# Patient Record
Sex: Female | Born: 1940 | Race: White | Hispanic: No | Smoking: Former smoker
Health system: Southern US, Community
[De-identification: ages and names within clinical notes are randomized; demographics above are authoritative.]

## PROBLEM LIST (undated history)

## (undated) DIAGNOSIS — E039 Hypothyroidism, unspecified: Secondary | ICD-10-CM

## (undated) DIAGNOSIS — J45909 Unspecified asthma, uncomplicated: Secondary | ICD-10-CM

## (undated) DIAGNOSIS — C52 Malignant neoplasm of vagina: Secondary | ICD-10-CM

## (undated) DIAGNOSIS — I10 Essential (primary) hypertension: Secondary | ICD-10-CM

## (undated) DIAGNOSIS — T8859XA Other complications of anesthesia, initial encounter: Secondary | ICD-10-CM

## (undated) DIAGNOSIS — R112 Nausea with vomiting, unspecified: Secondary | ICD-10-CM

## (undated) DIAGNOSIS — C539 Malignant neoplasm of cervix uteri, unspecified: Secondary | ICD-10-CM

## (undated) DIAGNOSIS — E785 Hyperlipidemia, unspecified: Secondary | ICD-10-CM

## (undated) DIAGNOSIS — Z9889 Other specified postprocedural states: Secondary | ICD-10-CM

## (undated) DIAGNOSIS — N904 Leukoplakia of vulva: Secondary | ICD-10-CM

## (undated) DIAGNOSIS — C519 Malignant neoplasm of vulva, unspecified: Secondary | ICD-10-CM

## (undated) DIAGNOSIS — Z8709 Personal history of other diseases of the respiratory system: Secondary | ICD-10-CM

## (undated) DIAGNOSIS — T4145XA Adverse effect of unspecified anesthetic, initial encounter: Secondary | ICD-10-CM

## (undated) DIAGNOSIS — J189 Pneumonia, unspecified organism: Secondary | ICD-10-CM

## (undated) DIAGNOSIS — I728 Aneurysm of other specified arteries: Secondary | ICD-10-CM

## (undated) DIAGNOSIS — Z87411 Personal history of vaginal dysplasia: Secondary | ICD-10-CM

## (undated) HISTORY — PX: SUPRAPUBIC CATHETER PLACEMENT: SHX2473

## (undated) HISTORY — PX: ABDOMINAL HYSTERECTOMY: SHX81

## (undated) HISTORY — DX: Malignant neoplasm of cervix uteri, unspecified: C53.9

## (undated) HISTORY — PX: BREAST SURGERY: SHX581

## (undated) HISTORY — DX: Essential (primary) hypertension: I10

## (undated) HISTORY — DX: Hyperlipidemia, unspecified: E78.5

## (undated) HISTORY — DX: Hypothyroidism, unspecified: E03.9

---
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
## 2005-03-08 ENCOUNTER — Emergency Department (HOSPITAL_COMMUNITY): Admission: EM | Admit: 2005-03-08 | Discharge: 2005-03-08 | Payer: Self-pay | Admitting: Emergency Medicine

## 2005-03-08 IMAGING — CR DG CHEST 2V
2 series · 2 of 2 positions shown · non-contrast
Comparison: none

CLINICAL DATA: Dyspnea. 
 CHEST - 2 VIEW:

[view not recorded (1 of 2)]
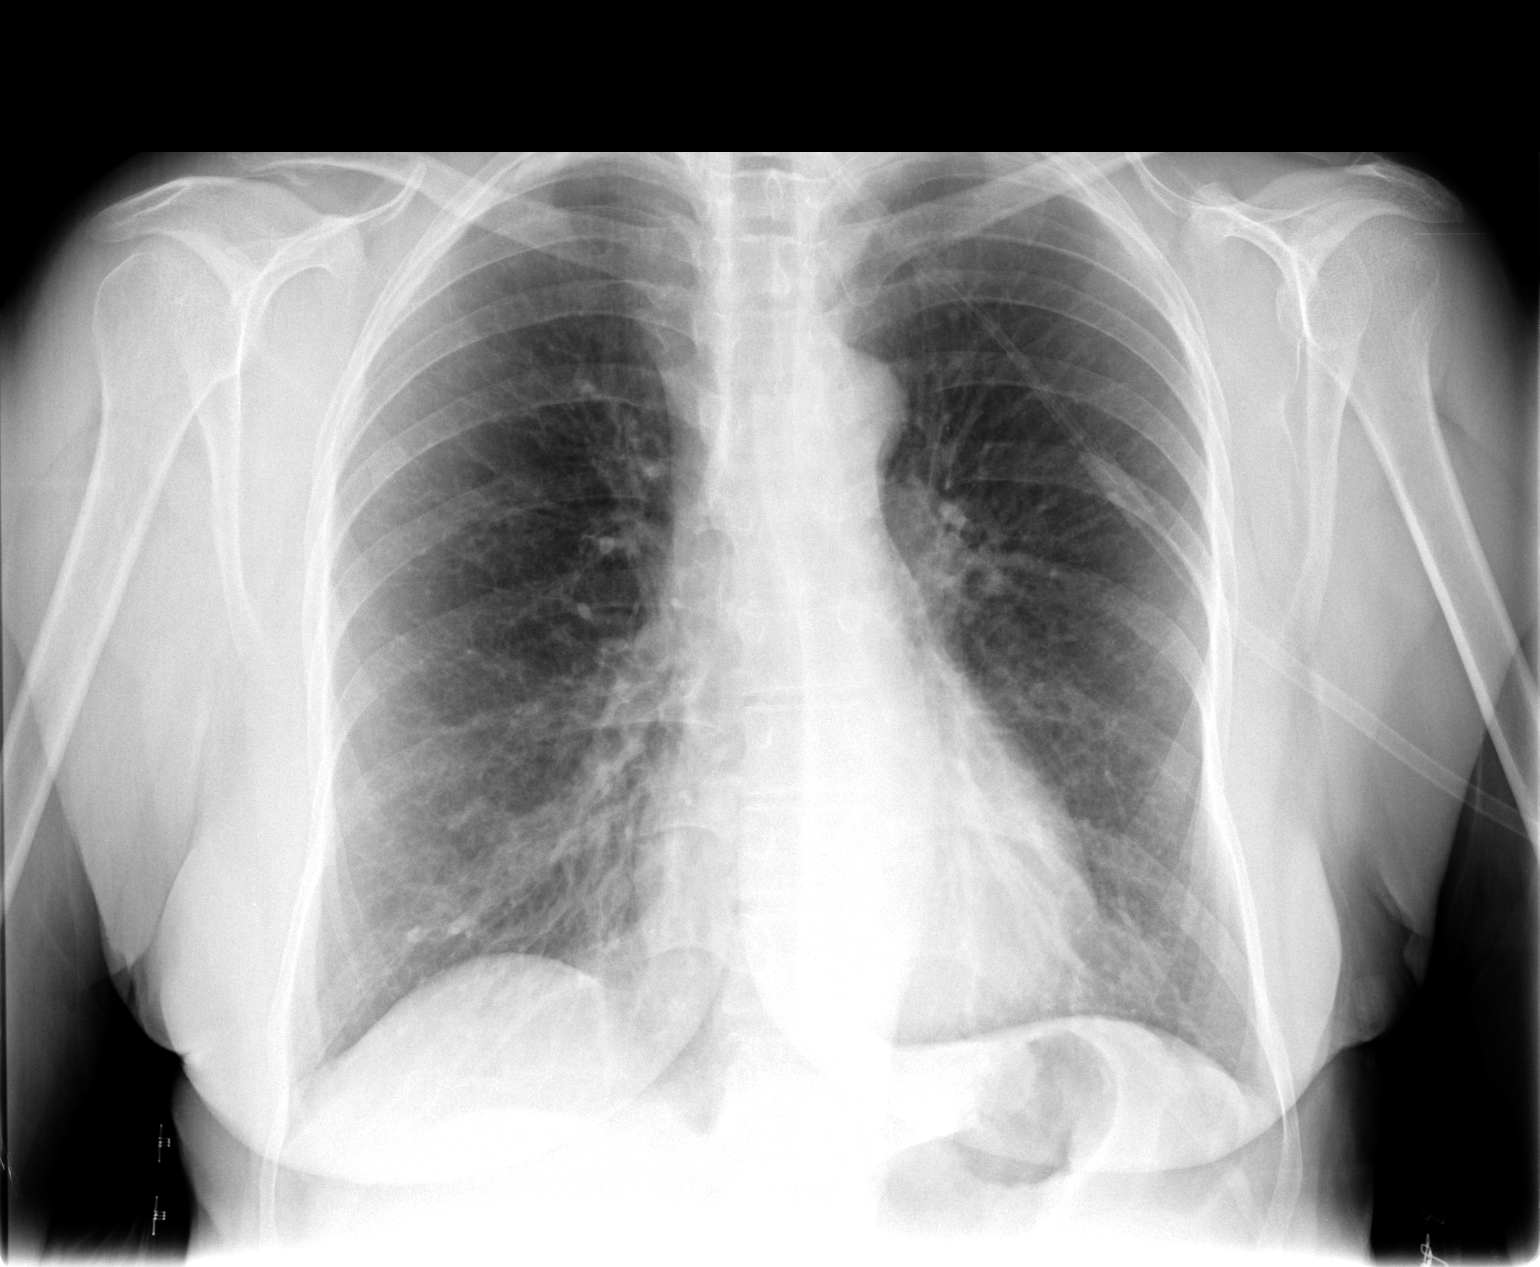

[view not recorded (2 of 2)]
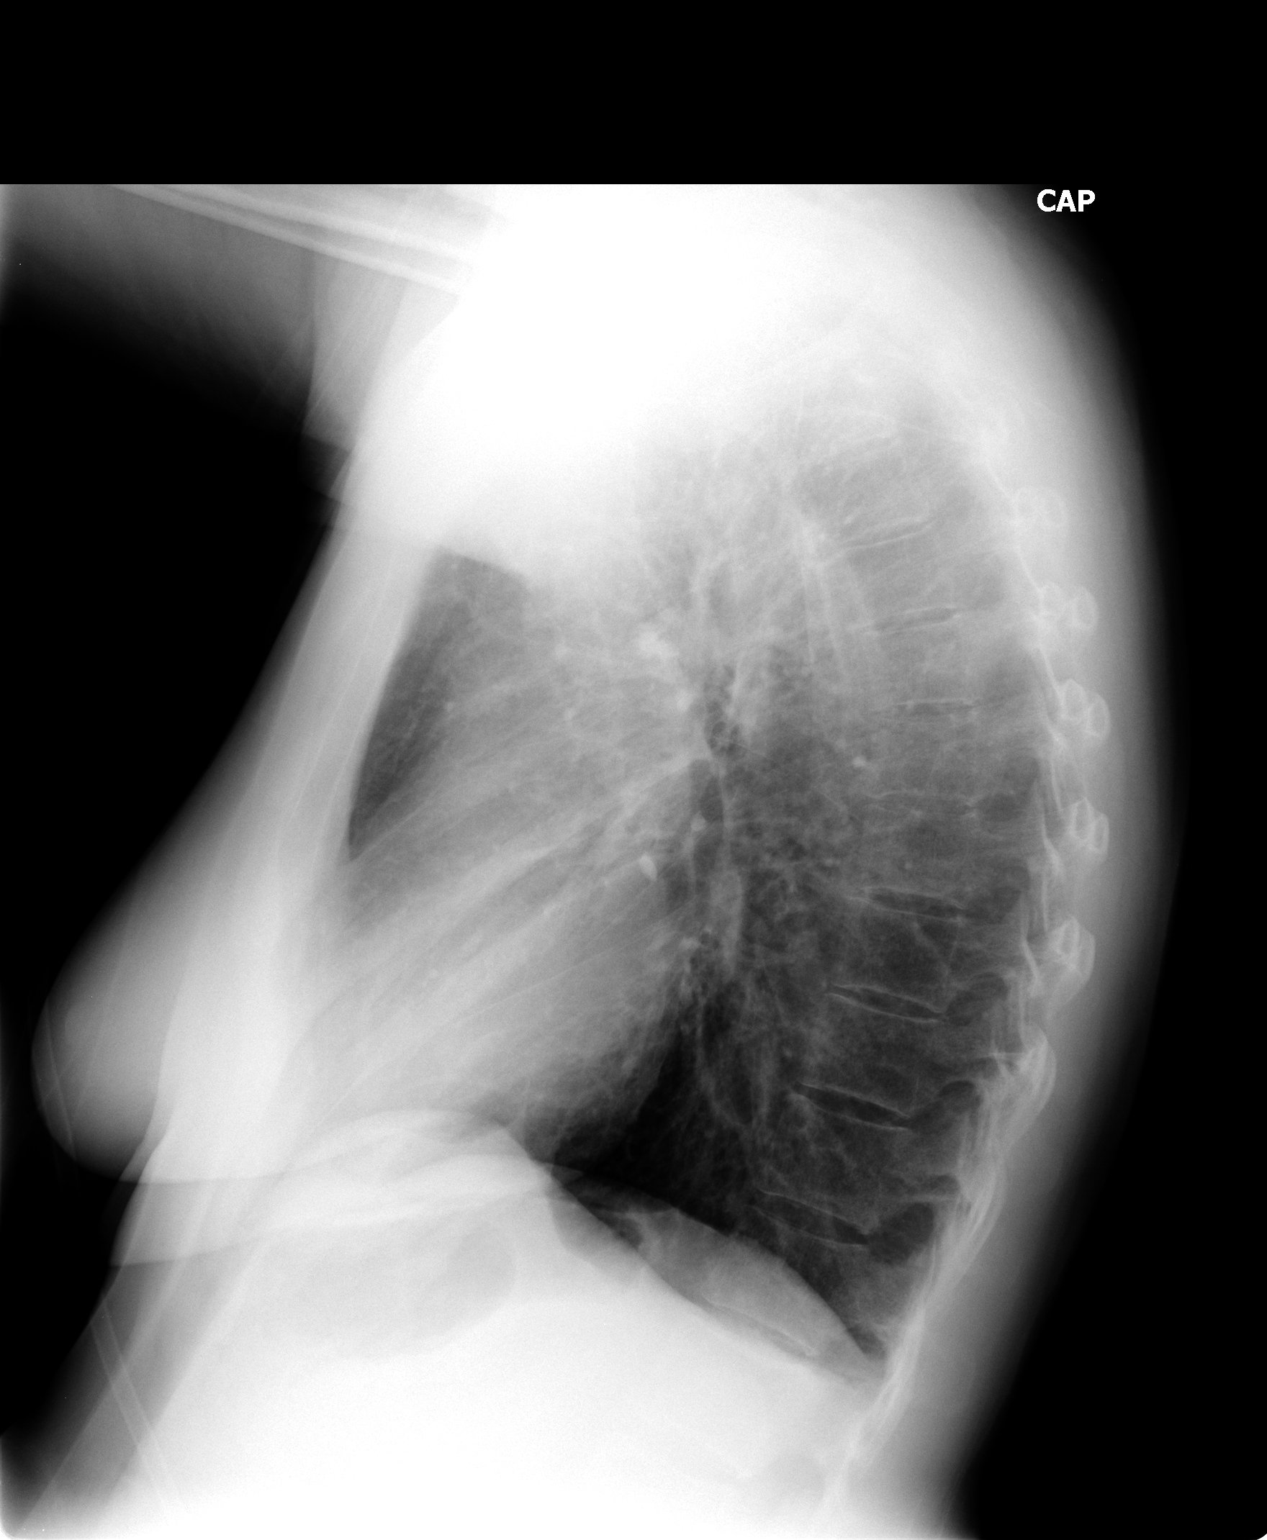

[2 of 2 positions shown; findings below may reference images not displayed]

FINDINGS: Cardiomediastinal silhouette is unremarkable.  Mild peribronchial thickening is present without focal air space disease.  There is suggestion of COPD present.  No pleural effusions or pneumothorax identified.  The bony thorax is within normal limits except for right AC joint osteoarthritis.
IMPRESSION: Peribronchial thickening with suggestion of COPD.  No focal air space disease.

## 2005-03-19 ENCOUNTER — Encounter (INDEPENDENT_AMBULATORY_CARE_PROVIDER_SITE_OTHER): Payer: Self-pay | Admitting: Cardiology

## 2005-03-19 ENCOUNTER — Ambulatory Visit (HOSPITAL_COMMUNITY): Admission: RE | Admit: 2005-03-19 | Discharge: 2005-03-19 | Payer: Self-pay | Admitting: Family Medicine

## 2006-02-09 ENCOUNTER — Other Ambulatory Visit: Admission: RE | Admit: 2006-02-09 | Discharge: 2006-02-09 | Payer: Self-pay | Admitting: Family Medicine

## 2006-03-09 DIAGNOSIS — Z8541 Personal history of malignant neoplasm of cervix uteri: Secondary | ICD-10-CM

## 2006-03-09 HISTORY — PX: ABDOMINAL HYSTERECTOMY: SHX81

## 2006-03-09 HISTORY — DX: Personal history of malignant neoplasm of cervix uteri: Z85.41

## 2006-03-30 ENCOUNTER — Ambulatory Visit: Admission: RE | Admit: 2006-03-30 | Discharge: 2006-03-30 | Payer: Self-pay | Admitting: Gynecologic Oncology

## 2006-04-09 DIAGNOSIS — C539 Malignant neoplasm of cervix uteri, unspecified: Secondary | ICD-10-CM

## 2006-04-09 HISTORY — DX: Malignant neoplasm of cervix uteri, unspecified: C53.9

## 2006-04-27 ENCOUNTER — Inpatient Hospital Stay (HOSPITAL_COMMUNITY): Admission: RE | Admit: 2006-04-27 | Discharge: 2006-04-30 | Payer: Self-pay | Admitting: Obstetrics & Gynecology

## 2006-04-27 ENCOUNTER — Encounter (INDEPENDENT_AMBULATORY_CARE_PROVIDER_SITE_OTHER): Payer: Self-pay | Admitting: Specialist

## 2006-04-27 HISTORY — PX: TOTAL ABDOMINAL HYSTERECTOMY W/ BILATERAL SALPINGOOPHORECTOMY: SHX83

## 2006-06-22 ENCOUNTER — Ambulatory Visit: Admission: RE | Admit: 2006-06-22 | Discharge: 2006-06-22 | Payer: Self-pay | Admitting: Gynecology

## 2006-12-08 ENCOUNTER — Other Ambulatory Visit: Admission: RE | Admit: 2006-12-08 | Discharge: 2006-12-08 | Payer: Self-pay | Admitting: Gynecology

## 2006-12-17 ENCOUNTER — Encounter: Payer: Self-pay | Admitting: Gynecology

## 2006-12-17 ENCOUNTER — Ambulatory Visit: Admission: RE | Admit: 2006-12-17 | Discharge: 2006-12-17 | Payer: Self-pay | Admitting: Gynecology

## 2007-07-29 ENCOUNTER — Encounter: Payer: Self-pay | Admitting: Gynecology

## 2007-07-29 ENCOUNTER — Ambulatory Visit: Admission: RE | Admit: 2007-07-29 | Discharge: 2007-07-29 | Payer: Self-pay | Admitting: Gynecology

## 2007-07-29 ENCOUNTER — Other Ambulatory Visit: Admission: RE | Admit: 2007-07-29 | Discharge: 2007-07-29 | Payer: Self-pay | Admitting: Gynecology

## 2008-03-06 ENCOUNTER — Other Ambulatory Visit: Admission: RE | Admit: 2008-03-06 | Discharge: 2008-03-06 | Payer: Self-pay | Admitting: Gynecology

## 2008-03-06 ENCOUNTER — Ambulatory Visit: Admission: RE | Admit: 2008-03-06 | Discharge: 2008-03-06 | Payer: Self-pay | Admitting: Gynecology

## 2008-03-06 ENCOUNTER — Encounter: Payer: Self-pay | Admitting: Gynecology

## 2009-02-15 ENCOUNTER — Other Ambulatory Visit: Admission: RE | Admit: 2009-02-15 | Discharge: 2009-02-15 | Payer: Self-pay | Admitting: Gynecology

## 2009-02-15 ENCOUNTER — Ambulatory Visit: Admission: RE | Admit: 2009-02-15 | Discharge: 2009-02-15 | Payer: Self-pay | Admitting: Gynecology

## 2010-03-05 ENCOUNTER — Other Ambulatory Visit
Admission: RE | Admit: 2010-03-05 | Discharge: 2010-03-05 | Payer: Self-pay | Source: Home / Self Care | Admitting: Gynecology

## 2010-03-05 ENCOUNTER — Ambulatory Visit
Admission: RE | Admit: 2010-03-05 | Discharge: 2010-03-05 | Payer: Self-pay | Source: Home / Self Care | Attending: Gynecology | Admitting: Gynecology

## 2010-03-09 DIAGNOSIS — I728 Aneurysm of other specified arteries: Secondary | ICD-10-CM

## 2010-03-09 HISTORY — DX: Aneurysm of other specified arteries: I72.8

## 2010-07-22 NOTE — Consult Note (Signed)
[name], [name]                   ACCOUNT NO.:  [id number]   MEDICAL RECORD NO.:  [id number]          PATIENT TYPE:  OUT   LOCATION:  GYN                          FACILITY:  [facility]   PHYSICIAN:  De Blanch, M.D.DATE OF BIRTH:  12-22-1940   DATE OF CONSULTATION:  12/17/2006  DATE OF DISCHARGE:                                 CONSULTATION   CHIEF COMPLAINT:  Cervical cancer.   INTERVAL HISTORY:  The patient returns today for continuing followup of  stage I cervical cancer. In the interval since her last visit, she has  seen Dr. Fransico [name] in [place].  The patient's only complaint is that  of anal irritation.  She denies any pelvic pain, pressure, vaginal  bleeding, or discharge.  She does have some urinary hesitancy, but she  has learned how to manage voiding with this change after surgery.  Otherwise, her health has been good.   PAST MEDICAL HISTORY:  1. Hypertension.  2. Hyperlipidemia.   PAST SURGICAL HISTORY:  Radical hysterectomy with pelvic lymphadenectomy  February 2008.   CURRENT MEDICATIONS:  Crestor and hydrochlorothiazide.   DRUG ALLERGIES:  CODEINE.   SOCIAL HISTORY:  The patient was a one-half-pack-a-day smoker for 20  years.  She no longer smokes.  She is a widow.  She works in Teaching laboratory technician.   FAMILY HISTORY:  Is negative for gynecologic, breast, or colon cancer.   REVIEW OF SYSTEMS:  A 10-point comprehensive Review of Systems negative  except as noted above.   PHYSICAL EXAMINATION:  VITAL SIGNS:  Weight 161 pounds, blood pressure  110/70, pulse 80, respiratory rate 20.  GENERAL:  The patient is a healthy white female in no acute distress.  HEENT:  Negative.  NECK:  Supple without thyromegaly.  ADENOPATHY:  There is no supraclavicular or inguinal adenopathy.  ABDOMEN:  Soft, nontender.  No mass, organomegaly, ascites or hernias  noted.  PELVIC:  EG/BUS shows a fair amount of dermatitis around the anus.  The  vulva itself is fine. Vagina is normal.   No lesions noted.  Bimanual and  rectovaginal exams reveal no mass, induration, or nodularity.   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   PLAN:  Pap smears were obtained.   The patient has anal irritation and given a prescription for Temovate  0.05% to be applied b.i.d. until the dermatitis resolves.  She will  return to see Dr. Shon [name] in 3 months, return to see [place] in 7 months.   INDICATIONS:  Copy [name] and then she [name] thank you      De Blanch, M.D.  Electronically Signed     DC/MEDQ  D:  12/17/2006  T:  12/17/2006  Job:  [id number]   cc:   [name]  Fax: [phone]   [name], R.N.  [address]  [place], [place] [address]

## 2010-07-22 NOTE — Consult Note (Signed)
[name], [name]                   ACCOUNT NO.:  [id number]   MEDICAL RECORD NO.:  [id number]          PATIENT TYPE:  OUT   LOCATION:  GYN                          FACILITY:  [facility]   PHYSICIAN:  De Blanch, M.D.DATE OF BIRTH:  10-01-1940   DATE OF CONSULTATION:  07/29/2007  DATE OF DISCHARGE:                                 CONSULTATION   CHIEF COMPLAINT:  Cervical cancer.   INTERVAL HISTORY:  The patient returns today for continuing follow-up.  She reports that she has done well since her last visit.  She denies any  GI symptoms.  She has no pelvic pain, pressure, vaginal bleeding or  discharge.  She does continue to have some urinary hesitancy.  She  denies any other urinary tract symptoms and has not had any infections  over the past 6 months.   HISTORY OF PRESENT ILLNESS:  The patient underwent a radical  hysterectomy for a stage Ib1 squamous cell carcinoma of the cervix on  April 27, 2006.  All margins and lymph nodes were free of disease.  The depth of invasion was 6 mm and no lymphovascular space involvement  was noted.  No adjuvant therapy was offered.   PAST MEDICAL HISTORY:  Medical illnesses:  Hypertension, hyperlipidemia.   PAST SURGICAL HISTORY:  Radical hysterectomy, pelvic lymphadenectomy  February 2008.   CURRENT MEDICATIONS:  Crestor and hydrochlorothiazide.   DRUG ALLERGIES:  CODEINE.   SOCIAL HISTORY:  The patient was a one-half-pack-a-day smoker for 20  years.  She no longer smokes.  She is widowed.  She works in Teaching laboratory technician.   FAMILY HISTORY:  Negative for gynecologic, breast or colon cancer.   REVIEW OF SYSTEMS:  Ten-point comprehensive review of systems negative  except as noted above.  The patient is quite concerned about her weight  gain.   PHYSICAL EXAMINATION:  Weight 167 pounds (up 6 pounds since October  2008).  Blood pressure 110/70, pulse 80, respiratory rate 20.  GENERAL:  The patient is a healthy white female in no acute  distress.  HEENT:  Negative.  NECK:  Supple without thyromegaly.  There is no supraclavicular or  inguinal adenopathy.  ABDOMEN:  Soft, nontender.  No masses, organomegaly or ascites are  noted.  She does have an umbilical hernia which is easily reducible.  PELVIC:  EG/BUS, vagina, bladder, urethra are normal.  Cervix and uterus  are surgically absent.  Adnexa without masses.  Rectovaginal exam  confirms.   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  No evidence of recurrent disease.  Pap smears are obtained.  The  patient will return to see Dr. Fransico Michael in 4 months and return to see  [place] in 8 months.      De Blanch, M.D.  Electronically Signed     DC/MEDQ  D:  07/29/2007  T:  07/29/2007  Job:  [id number]   cc:   Fransico Michael  Fax: [phone]   Telford Nab, R.N.  [phone] [address]  [place], [place] [address]

## 2010-07-22 NOTE — Consult Note (Signed)
NAMETERRICKA, [name]                   ACCOUNT NO.:  [id number]   MEDICAL RECORD NO.:  [id number]          PATIENT TYPE:  OUT   LOCATION:  GYN                          FACILITY:  [facility]   PHYSICIAN:  De Blanch, M.D.DATE OF BIRTH:  1941/01/31   DATE OF CONSULTATION:  03/06/2008  DATE OF DISCHARGE:                                 CONSULTATION   CHIEF COMPLAINT:  Cervical cancer.   INTERVAL HISTORY:  Patient returns today for continuing followup of  cervical cancer.  Since her last visit, she has done well.  She denies  any GI or GU symptoms, has no pelvic pain, pressure, vaginal bleed, or  discharge.   Patient recently had negative mammograms and has also recently been  diagnosed as having hypothyroidism.   HISTORY OF PRESENT ILLNESS:  Patient underwent a radical hysterectomy  for a stage IB1 squamous cell carcinoma of the cervix, February 2008.  All margins and lymph nodes were free of disease.  No adjuvant therapy  was recommended.   PAST MEDICAL HISTORY/MEDICAL ILLNESSES:  1. Hypertension.  2. Hyperlipidemia.  3. Hypothyroidism.   PAST SURGICAL HISTORY:  1. Radical hysterectomy.  2. Pelvic lymphadenectomy.  3. Suprapubic catheter placement, February 2008.   CURRENT MEDICATIONS:  1. Crestor.  2. Hydrochlorothiazide.  3. Synthroid.   DRUG ALLERGIES:  CODEINE.   SOCIAL HISTORY:  The patient is a 1/2 pack a day smoker for 20 years.  She did discontinue smoking several years ago.  She is widowed.  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   FAMILY HISTORY:  Negative for gynecologic, breast, or colon cancer.   REVIEW OF SYSTEMS:  A 10-point comprehensive review of systems negative  except as noted above.   PHYSICAL EXAM:  Weight 158 pounds.  Blood pressure 123/60.  Pulse 80.  Respiratory 20.  GENERAL:  The patient is a healthy white female in no acute distress.  HEENT:  Negative.  NECK:  Supple without thyromegaly.  There is no supraclavicular or  inguinal  adenopathy.  ABDOMEN:  Soft and nontender.  No mass, organomegaly, ascites, or  hernias are noted.  PELVIC EXAM:  EG/BUS, vagina, bladder, urethra are normal.  Cervix and  uterus are surgically absent.  The vagina is somewhat foreshortened.  Bimanual and rectovaginal exam reveal no masses, induration, or  nodularity.   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  No  evidence of recurrent disease.   PLAN:  Pap smears were obtained.  Patient will return to see Dr. Fransico Michael in 6 months and return to see [place] in 1 year.      De Blanch, M.D.  Electronically Signed     DC/MEDQ  D:  03/06/2008  T:  03/07/2008  Job:  [id number]   cc:   Fransico Michael  Fax: [phone]   Telford Nab, R.N.  [phone] [address]  [place], [place] [address]

## 2010-07-25 NOTE — Op Note (Signed)
[name], [name]                   ACCOUNT NO.:  [id number]   MEDICAL RECORD NO.:  [id number]          PATIENT TYPE:  INP   LOCATION:  1617                         FACILITY:  [facility]   PHYSICIAN:  De Blanch, M.D.DATE OF BIRTH:  1940-10-05   DATE OF PROCEDURE:  04/27/2006  DATE OF DISCHARGE:                               OPERATIVE REPORT   PREOPERATIVE DIAGNOSIS:  Stage I-B1 squamous cell carcinoma of the  cervix.   POSTOPERATIVE DIAGNOSIS:  Stage I-B1 squamous cell carcinoma of the  cervix.   PROCEDURE:  DICTATION ENDED HERE.      De Blanch, M.D.  Electronically Signed     DC/MEDQ  D:  04/27/2006  T:  04/28/2006  Job:  [id number]

## 2010-07-25 NOTE — Discharge Summary (Signed)
[name], [name]                   ACCOUNT NO.:  [id number]   MEDICAL RECORD NO.:  [id number]          PATIENT TYPE:  INP   LOCATION:  1617                         FACILITY:  [facility]   PHYSICIAN:  Roseanna Rainbow, M.D.DATE OF BIRTH:  [birth year]   DATE OF ADMISSION:  04/27/2006  DATE OF DISCHARGE:  04/30/2006                               DISCHARGE SUMMARY   CHIEF COMPLAINT:  The patient is a 70 year old with stage I, B1,  squamous cell carcinoma of the cervix.  She presents for a radical  hysterectomy.  Please see the dictated history and physical for further  details.   HOSPITAL COURSE:  The patient was admitted and underwent a radical  hysterectomy with bilateral salpingo-oophorectomy, pelvic  lymphadenectomy, and suprapubic catheter placement.  Again, please see  the dictated operative summary.  On postoperative day #1, her hemoglobin  was 9.5.  Basic metabolic profile was normal.  She was encouraged to  ambulate.  The remainder of her hospital course was uneventful.  She was  discharged to home on postoperative day #3, tolerating a regular diet.   DISCHARGE DIAGNOSIS:  Stage I, B1, squamous cell carcinoma of the  cervix.   PROCEDURES:  Radical hysterectomy, bilateral salpingo-oophorectomy,  pelvic lymphadenectomies and a suprapubic catheter placement.   CONDITION:  Stable   ACTIVITY:  Progressive activity, pelvic rest.   MEDICATIONS:  Included Percocet 1 to 2 tablets every 6 hours as needed.   DISPOSITION:  The patient was to follow up in the GYN oncology office in  1 week.      Roseanna Rainbow, M.D.  Electronically Signed     LAJ/MEDQ  D:  06/09/2006  T:  06/09/2006  Job:  [id number]   cc:   Telford Nab, R.N.  [address]  [place], [place] [address]

## 2010-07-25 NOTE — Consult Note (Signed)
[name], [name]                   ACCOUNT NO.:  [id number]   MEDICAL RECORD NO.:  [id number]          PATIENT TYPE:  OUT   LOCATION:  GYN                          FACILITY:  [facility]   PHYSICIAN:  De Blanch, M.D.DATE OF BIRTH:  1940-08-13   DATE OF CONSULTATION:  06/22/2006  DATE OF DISCHARGE:                                 CONSULTATION   CHIEF COMPLAINT:  Cervical cancer.   INTERVAL HISTORY:  The patient underwent a radical hysterectomy and  pelvic lymphadenectomy, April 27, 2006.  Final pathology showed a  squamous cell carcinoma with 6 mm of invasion out of a 1.5-cm-thick  cervix.  Pelvic lymph nodes and surgical margins were negative.  The  patient had an uncomplicated postoperative course and has subsequently  had her suprapubic catheter removed.  Overall, her functional status is  very good and she comes today for her first postoperative checkup.   She denies any GI or GU symptoms and feels at her bladder is emptying  reasonably well and has no incontinence, although she does have some  urgency.   PHYSICAL EXAM:  VITAL SIGNS:  Weight 162 pounds, blood pressure 130/70,  pulse 80, respiratory rate 20.  GENERAL:  The patient is a healthy white female in no acute distress.  HEENT:  Negative.  NECK:  Supple without thyromegaly.  LYMPHATICS:  There is no supraclavicular or inguinal adenopathy.  ABDOMEN:  Her incision is healing well.  The abdomen is otherwise soft  and nontender.  No mass, organomegaly, ascites or hernias are noted.  PELVIC:  EG/BUS, vagina, bladder and urethra are normal.  Cervix and  uterus are surgically absent.  Cuff is healing well.  Bimanual and  rectovaginal exam reveal no mass, induration or nodularity.  LOWER EXTREMITIES:  Without edema or varicosities.   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  She has good prognostic features and I do  not believe any adjuvant therapy is  necessary.  We will have her return  to see Dr. Fransico Michael in 3 months and ask that a physical exam and Pap  smear be obtained at that time.  She will return to see [place] in 6 months.      De Blanch, M.D.  Electronically Signed     DC/MEDQ  D:  06/22/2006  T:  06/23/2006  Job:  [id number]   cc:   Telford Nab, R.N.  [address]  [place], [place] [address]   Fransico Michael  Fax: [phone]

## 2010-07-25 NOTE — Op Note (Signed)
[name], [name]                   ACCOUNT NO.:  [id number]   MEDICAL RECORD NO.:  [id number]          PATIENT TYPE:  INP   LOCATION:  0001                         FACILITY:  [facility]   PHYSICIAN:  De Blanch, M.D.DATE OF BIRTH:  26-Jun-1940   DATE OF PROCEDURE:  04/27/2006  DATE OF DISCHARGE:                               OPERATIVE REPORT   PREOPERATIVE DIAGNOSIS:  Stage Ib1 squamous cell carcinoma of the  cervix.   POSTOPERATIVE DIAGNOSIS:  Stage Ib1 squamous cell carcinoma of the  cervix.   PROCEDURE:  1. Radical abdominal hysterectomy.  2. Bilateral salpingo-oophorectomy.  3. Pelvic lymphadenectomy.  4. Placement of suprapubic catheter.   SURGEON:  De Blanch, M.D.   ASSISTANT:  Roseanna Rainbow, M.D.  and Telford Nab, R.N.   ANESTHESIA:  General endotracheal tube.   ESTIMATED BLOOD LOSS:  450 mL.   SURGICAL FINDINGS:  The patient had an approximately 1 cm, slightly  exophytic lesion on the exocervix in the anterior portion.  She was  status post tubal ligation with disruption of her fallopian tubes.  Otherwise, the uterus, tubes, and ovaries appeared normal.  There was no  evidence of adenopathy.  Palpation of the aorta revealed considerable  atherosclerosis, but not evidence of adenopathy.  The remainder of the  peritoneal cavity appeared normal.   DESCRIPTION OF PROCEDURE:  The patient was taken to the operating room;  and after satisfactory attainment of general anesthesia, was placed in  the modified lithotomy position in Larke stirrups.  The anterior  abdominal wall, perineum, and vagina were prepped with Betadine.  A  Foley catheter was inserted and the patient was draped.  The abdomen was  entered through a Pfannenstiel incision.  The abdomen and pelvis  explored with the above-noted findings.  A Bookwalter retractor was  assembled; and the bowel was packed out of the pelvis.  The uterus was  grasped with long Kelly clamps.  The  right round ligament was divided  with cautery; and the retroperitoneal space opened after incising the  lateral side wall.  The pararectal and perivesical spaces were  developed, identifying the vessels and ureter.  The ovarian vessels were  skeletonized, clamped, retied, and suture ligated.  The superior vesical  artery was isolated and traced cephalad until we identified the uterine  artery.  The uterine artery was doubly clipped at its origin, divided  and the distal portion marked with a long silk free-tie.  The parametria  were then divided using the linear stapler.   A similar procedure was performed on both sides of the pelvic.  The  ureters were mobilized from their attachments to the peritoneum and the  posterior cul-de-sac was incised.  The rectovaginal septum was  developed.  Some venous bleeding from the vaginal was encountered.  A  pack was placed once.  The pack was removed; and the uterosacral  ligament was developed and these were divided using a linear stapler.  The bladder flap was advanced to sharp and blunt dissection off the  cervix and upper vagina.  The ureter was then mobilized out  of its  distal tunnel, divided in the anterior vesicouterine ligament between  [facility] clamps.  Each pedicle was suture ligated.  Ureter was then  mobilized all the way to its insertion to the bladder bilaterally.  With  the ureter was held laterally, the posterior vesicouterine ligament was  clamped, divided, and suture ligated.  Paravaginal tissue was then  clamped, cut, and suture ligated.  Vaginal angles were then cross  clamped and divided; and the vagina incised circumferentially.  The  specimen was removed.  It was noted that the vaginal margin did not  appear quite satisfactory; and, therefore, an additional margin of  approximately 1.5  cm was obtained by further mobilizing the bladder and  rectum off the anterior and posterior vagina.  Once this vaginal margin  was removed;  it was marked with a suture of the distal portion at 12  o'clock; this was submitted as a separate specimen.  The vagina was then  closed with interrupted figure-of-eight sutures of #0 Vicryl.  The  central portion vagina was incorporated with the bladder peritoneum; and  the rectal peritoneum thus reperitonealizng the central portion of the  pelvis.   Attention was turned to the pelvic lymphadenectomy.  Lymph nodes were  removed from the external iliac artery and vein, internal iliac artery,  and obturator fossa.  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  This had previously been clipped near its origin.  The vein was, again, identified and clipped at its origin from the  external iliac vein as we proceeded with the complete lymphadenectomy.  The obturator nerve was identified and protected throughout the  dissection.  A similar procedure was performed on both sides of the  pelvis.   The pelvis was then irrigated and found to be hemostatic.  The  retropubic space was opened and a cystotomy was informed in the dome of  the bladder.  The suprapubic catheter was introduced and the cystotomy  closed with two layers of 2-0 Vicryl.  Prior to closing the cystotomy,  the patient is given intravenous indigo carmine; and it was noted that  she had good efflux from both ureteral orifices.   It should be noted that prior to closing the bladder, and in the course  of mobilizing the ureter out of the tunnel; a 1 cm cystotomy had been  created on the lateral portion of the left side of the bladder.  This  had been closed in two layers using 2-0 Vicryl.  It had also been tested  by insufflating the bladder with saline; and no leak was noted.  By  using IV indigo carmine, we assured ourselves that we had not ligated  the distal ureter either.  Pelvis was, once again, reirrigated.  Packs and  retractors were removed;  and the anterior abdominal wall closed in layers; the first being a  running #0 Vicryl suture on the peritoneum.  The fascia was  reapproximated with a running suture of #1 PDS.  Subcutaneous tissue was  irrigated.  Hemostasis achieved with cautery.  Skin closed with skin  staples.  A dressing was applied.  The suprapubic catheter was sutured  to the skin using 2-0 silk.  The patient was awakened from anesthesia;  and taken to recovery room in satisfactory condition.  Sponge, needle  and instrument counts were correct x2.      De Blanch, M.D.  Electronically Signed     DC/MEDQ  D:  04/27/2006  T:  04/27/2006  Job:  [id number]   cc:   Fransico Michael  Fax: [phone]   Telford Nab, R.N.  [phone] [address]  [place], [place] [address]

## 2010-07-25 NOTE — Consult Note (Signed)
[name], [name]                   ACCOUNT NO.:  [id number]   MEDICAL RECORD NO.:  [id number]          PATIENT TYPE:  OUT   LOCATION:  GYN                          FACILITY:  [facility]   PHYSICIAN:  [name], MD    DATE OF BIRTH:  [birth year]   DATE OF CONSULTATION:  03/30/2006  DATE OF DISCHARGE:                                 CONSULTATION   The patient is seen today in consultation at the request of Dr. Shon Baton.  [name] is a 70 year old gravida 3, para 3 who has not had a routine  GYN examination in about five years.  She was seen by her primary  physician, Dr. Harlene Salts office, at which point it was recommended that  she undergo a Pap smear.  Her Pap smear revealed squamous cell carcinoma  and she subsequently seen by Dr. Shon Baton.  Dr. Shon Baton saw the patient on  March 15, 2006.  Biopsies were obtained at that time.  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  There was an invasive  component compatible with at least microinvasive disease.  There is a  focus of suspicious for lymphovascular space involvement.  Endocervical  curettage revealed high grade dysplasia.  This has been her first  abnormal Pap smear ever, but again, she has not had GYN care in greater  than five years.  She denies any bleeding, any significant discharge or  pain.  She denies any change in bowel or bladder habits.  Her  cholesterol has been quite high at almost 400.  She was placed on  Crestor and is watching her diet steadily and her cholesterol dropped  from 391 to 214. With that associated dietary modification, she has lost  approximately 8 pounds, however, that has been intentional.  She denies  any chest pain, short of breath, nausea, vomiting, fevers, chills,  headaches or visual changes.   MEDICATIONS:  Crestor and hydrochlorothiazide.   ALLERGIES:  CODEINE which cause her to be jittery.   SOCIAL  HISTORY:  She quit smoking a year ago, she smoked half a pack a  day for about 20 years.  She denies use of alcohol.  She is a widow.  She works in Teaching laboratory technician.   PAST MEDICAL HISTORY:  Significant for asthma, hypercholesterolemia, and  hypertension.   PAST SURGICAL HISTORY:  A cyst removed from her right nipple which was  benign, tubal ligation, spontaneous vaginal deliveries x3.   FAMILY HISTORY:  Her mother had a myocardial infarction.  She is 69  years old at this time, alive and well.   HEALTH MAINTENANCE:  Her mammogram was a year ago.  She has never had a  colonoscopy though she does do fecal occult stool samples on a yearly  basis and those have been negative.   PHYSICAL EXAMINATION:  VITAL SIGNS:  Weight 158 pounds, height 5 feet 6 inches.  GENERAL:  Well developed, well nourished female in no acute distress.  NECK:  Supple, there is no lymphadenopathy, no thyromegaly.  LUNGS:  Clear to auscultation bilaterally.  CARDIOVASCULAR:  Regular rate and rhythm.  ABDOMEN:  Soft, nontender, nondistended.  No palpable masses or  hepatosplenomegaly.  She has a reducible 1.5 cm umbilical hernia.  Groins are negative for adenopathy.  EXTREMITIES:  No edema.  PELVIC:  External genitalia is within normal limits.  Vagina is somewhat  atrophic.  The cervix is visualized.  On the cervix from the 9 to 12  o'clock position there is an erythematous, raised lesion consistent with  cervical carcinoma.  The area is friable.  It does not extend to the  vagina.  The patient is somewhat poorly tolerant to exam.  Bimanual  examination reveals the cervix is palpably normal.  The erosive lesion  is palpated on the anterior lip of the cervix from 9 to 12 o'clock.  The  corpus of normal size, shape, consistency.  There are no adnexal masses.  Rectovaginal exam reveals a small cervix measuring approximately 3 cm  with this erosive area that measures approximately 1 cm in size.  The  parametria are free on  rectovaginal examination.   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  The biopsy showed invasive and microinvasive disease with  questionable lymphovascular space involvement.  However, the visible  lesion by definition makes the patient a stage 1B.  The options  including surgery versus radiation were discussed with the patient as  well as her two daughters that were with her today.  We discussed  surgery including a radical hysterectomy, BSO and appropriate staging.  We could perform this either via laparotomy or robotic surgery.  The  patient opted for laparotomy.  She wish to have the surgery done in  [place].  The risks of surgery including bleeding, infection, injury  to surrounding organs, thromboembolic disease were discussed with the  patient as more short term sequelae.  Long term sequelae including  difficulty with bowels, bladder, lymphedema, including permanent bowel  or bladder dysfunction, were discussed with the patient and her  questions regarding this were elicited.  She does understand that if she  proceeds with exploratory laparotomy, TAH-BSO, that she may still need  postoperative radiation therapy based on either lymphatic involvement or  risk features within the cervix.  She understands another option would  be to proceed immediately with radiation therapy with chemo  sensitization.  While she states that she would accept radiation after  surgery she would not like to have primary radiation and she would like  to attempt of avoiding radiation at this point.  Surgery is going to be  scheduled with Dr. Stanford Breed for February 19.  Again, she  understands I will not be performing the surgery.  She will have the  opportunity to see Dr. Stanford Breed the morning of surgery. Her  questions, again, were elicited numerous times and answered to her satisfaction.  She has my card as well as that of Telford Nab and  will  call if she has any questions.  Full consultation and discussion  were held with her two daughters in the room.      [name], MD  Electronically Signed     PAG/MEDQ  D:  03/30/2006  T:  03/30/2006  Job:  [id number]

## 2011-03-05 ENCOUNTER — Encounter: Payer: Self-pay | Admitting: Gynecologic Oncology

## 2011-03-06 ENCOUNTER — Encounter: Payer: Self-pay | Admitting: Gynecology

## 2011-03-06 ENCOUNTER — Other Ambulatory Visit (HOSPITAL_COMMUNITY)
Admission: RE | Admit: 2011-03-06 | Discharge: 2011-03-06 | Disposition: A | Discharge status: Home / Self Care | Payer: BC Managed Care – PPO | Source: Ambulatory Visit | Attending: Gynecology | Admitting: Gynecology

## 2011-03-06 ENCOUNTER — Ambulatory Visit: Payer: BC Managed Care – PPO | Attending: Gynecology | Admitting: Gynecology

## 2011-03-06 VITALS — BP 128/64 | HR 84 | Temp 97.5°F | Resp 18 | Ht 66.0 in | Wt 174.8 lb

## 2011-03-06 DIAGNOSIS — Z79899 Other long term (current) drug therapy: Secondary | ICD-10-CM | POA: Insufficient documentation

## 2011-03-06 DIAGNOSIS — Z90710 Acquired absence of both cervix and uterus: Secondary | ICD-10-CM | POA: Insufficient documentation

## 2011-03-06 DIAGNOSIS — C539 Malignant neoplasm of cervix uteri, unspecified: Secondary | ICD-10-CM | POA: Insufficient documentation

## 2011-03-06 DIAGNOSIS — Z01419 Encounter for gynecological examination (general) (routine) without abnormal findings: Secondary | ICD-10-CM | POA: Insufficient documentation

## 2011-03-06 DIAGNOSIS — E039 Hypothyroidism, unspecified: Secondary | ICD-10-CM | POA: Insufficient documentation

## 2011-03-06 DIAGNOSIS — E785 Hyperlipidemia, unspecified: Secondary | ICD-10-CM | POA: Insufficient documentation

## 2011-03-06 DIAGNOSIS — I10 Essential (primary) hypertension: Secondary | ICD-10-CM | POA: Insufficient documentation

## 2011-03-06 NOTE — Progress Notes (Signed)
Consult Note: Gyn-Onc   [name] 70 y.o. female  Chief Complaint  Patient presents with  . Cervical Cancer    Follow up    Interval History:  Patient returns today for routine followup. Since her last visit she's done well. She denies any GI or GU symptoms. She denies any pelvic pain pressure or vaginal bleeding or discharge. Bladder function is good.  HPI: The patient underwent a radical hysterectomy with pelvic lymphadenectomy in February of 2008. All margins and lymph nodes were negative. She did not receive any adjuvant therapy and has been followed since that time with no evidence of recurrent disease.  Allergies  Allergen Reactions  . Codeine Anxiety    "Jumpy inside"    Past Medical History  Diagnosis Date  . Squamous cell carcinoma of cervix 04/2006    Stage IB1  . Hypertension   . Hyperlipidemia   . Hypothyroidism     Past Surgical History  Procedure Date  . Abdominal hysterectomy     Pelvic lymphadenectomy  . Suprapubic catheter placement     Current Outpatient Prescriptions  Medication Sig Dispense Refill  . cholecalciferol (VITAMIN D) 1000 UNITS tablet Take 1,000 Units by mouth daily.        . fish oil-omega-3 fatty acids 1000 MG capsule Take 4 capsules by mouth daily. Dose 1200mg / 360mg  Omega 3       . HYDROCHLOROTHIAZIDE PO Take 12.5 mg by mouth daily.       . Levothyroxine Sodium (SYNTHROID PO) Take 75 mcg by mouth daily. One tablet daily for two days then 1/2 tablet      . OXcarbazepine (TRILEPTAL) 150 MG tablet Take 150 mg by mouth 2 (two) times daily.        . Rosuvastatin Calcium (CRESTOR PO) Take 5 mg by mouth daily.         History   Social History  . Marital Status: Widowed    Spouse Name: N/A    Number of Children: N/A  . Years of Education: N/A   Occupational History  . Not on file.   Social History Main Topics  . Smoking status: Former Smoker    Quit date: 12/08/2003  . Smokeless tobacco: Not on file  . Alcohol Use: No  . Drug  Use: No  . Sexually Active: No   Other Topics Concern  . Not on file   Social History Narrative  . No narrative on file    Family History  Problem Relation Age of Onset  . Skin cancer Father     Review of Systems: 10 point review is negative except as noted above  Vitals: Blood pressure 128/64, pulse 84, temperature 97.5 F (36.4 C), resp. rate 18, height 5\' 6"  (1.676 m), weight 174 lb 12.8 oz (79.289 kg).  Physical Exam: In general the patient is a healthy white female in no acute distress  HEENT is negative  Neck is supple without thyromegaly. There is no supraclavicular or inguinal adenopathy.  The abdomen is soft nontender no masses or organomegally is noted. She does have a umbilical hernia.  Pelvic exam EGBUS vagina bladder urethra are normal  Cervix and uterus are surgically absent.  Bimanual and rectovaginal exam revealed no induration nodularity or masses.  Lower extremities are without edema or varicosities.  Assessment/Plan: Stage I B1 squamous cell carcinoma cervix 2008. The patient is clinically free of disease.  Pap smears are obtained  As we're approaching a five-year followup visit we will release the patient  from our practice. We would recommend she be seen by Dr. Fransico Michael for an annual exam.   Jeannette Corpus, MD 03/06/2011, 11:13 AM                         Consult Note: Gyn-Onc   [name] 70 y.o. female  Chief Complaint  Patient presents with  . Cervical Cancer    Follow up    Interval History:   HPI:  Allergies  Allergen Reactions  . Codeine Anxiety    "Jumpy inside"    Past Medical History  Diagnosis Date  . Squamous cell carcinoma of cervix 04/2006    Stage IB1  . Hypertension   . Hyperlipidemia   . Hypothyroidism     Past Surgical History  Procedure Date  . Abdominal hysterectomy     Pelvic lymphadenectomy  . Suprapubic catheter placement     Current Outpatient Prescriptions    Medication Sig Dispense Refill  . cholecalciferol (VITAMIN D) 1000 UNITS tablet Take 1,000 Units by mouth daily.        . fish oil-omega-3 fatty acids 1000 MG capsule Take 4 capsules by mouth daily. Dose 1200mg / 360mg  Omega 3       . HYDROCHLOROTHIAZIDE PO Take 12.5 mg by mouth daily.       . Levothyroxine Sodium (SYNTHROID PO) Take 75 mcg by mouth daily. One tablet daily for two days then 1/2 tablet      . OXcarbazepine (TRILEPTAL) 150 MG tablet Take 150 mg by mouth 2 (two) times daily.        . Rosuvastatin Calcium (CRESTOR PO) Take 5 mg by mouth daily.         History   Social History  . Marital Status: Widowed    Spouse Name: N/A    Number of Children: N/A  . Years of Education: N/A   Occupational History  . Not on file.   Social History Main Topics  . Smoking status: Former Smoker    Quit date: 12/08/2003  . Smokeless tobacco: Not on file  . Alcohol Use: No  . Drug Use: No  . Sexually Active: No   Other Topics Concern  . Not on file   Social History Narrative  . No narrative on file    Family History  Problem Relation Age of Onset  . Skin cancer Father     Review of Systems:  Vitals: Blood pressure 128/64, pulse 84, temperature 97.5 F (36.4 C), resp. rate 18, height 5\' 6"  (1.676 m), weight 174 lb 12.8 oz (79.289 kg).  Physical Exam:  Assessment/Plan:   Jeannette Corpus, MD 03/06/2011, 11:13 AM

## 2011-03-06 NOTE — Patient Instructions (Signed)
Return to Dr. Shon Baton for annual exams

## 2011-03-16 ENCOUNTER — Telehealth: Payer: Self-pay | Admitting: *Deleted

## 2011-03-16 NOTE — Telephone Encounter (Signed)
Message was left to let pt know that the recent pap on 03/06/2011 was negative

## 2011-11-04 DIAGNOSIS — G519 Disorder of facial nerve, unspecified: Secondary | ICD-10-CM | POA: Diagnosis not present

## 2012-03-10 DIAGNOSIS — I728 Aneurysm of other specified arteries: Secondary | ICD-10-CM | POA: Diagnosis not present

## 2012-11-03 DIAGNOSIS — G519 Disorder of facial nerve, unspecified: Secondary | ICD-10-CM | POA: Diagnosis not present

## 2013-02-08 DIAGNOSIS — F959 Tic disorder, unspecified: Secondary | ICD-10-CM | POA: Diagnosis not present

## 2013-02-08 DIAGNOSIS — G500 Trigeminal neuralgia: Secondary | ICD-10-CM | POA: Diagnosis not present

## 2013-03-09 DIAGNOSIS — Z923 Personal history of irradiation: Secondary | ICD-10-CM

## 2013-03-09 HISTORY — DX: Personal history of irradiation: Z92.3

## 2013-03-28 DIAGNOSIS — F959 Tic disorder, unspecified: Secondary | ICD-10-CM | POA: Diagnosis not present

## 2013-03-28 DIAGNOSIS — Z510 Encounter for antineoplastic radiation therapy: Secondary | ICD-10-CM | POA: Diagnosis not present

## 2013-03-28 DIAGNOSIS — G500 Trigeminal neuralgia: Secondary | ICD-10-CM | POA: Diagnosis not present

## 2013-11-02 DIAGNOSIS — G500 Trigeminal neuralgia: Secondary | ICD-10-CM | POA: Diagnosis not present

## 2013-12-01 DIAGNOSIS — Z23 Encounter for immunization: Secondary | ICD-10-CM | POA: Diagnosis not present

## 2013-12-01 DIAGNOSIS — R7309 Other abnormal glucose: Secondary | ICD-10-CM | POA: Diagnosis not present

## 2013-12-01 DIAGNOSIS — E039 Hypothyroidism, unspecified: Secondary | ICD-10-CM | POA: Diagnosis not present

## 2013-12-01 DIAGNOSIS — E785 Hyperlipidemia, unspecified: Secondary | ICD-10-CM | POA: Diagnosis not present

## 2013-12-01 DIAGNOSIS — I10 Essential (primary) hypertension: Secondary | ICD-10-CM | POA: Diagnosis not present

## 2014-06-11 DIAGNOSIS — Z1389 Encounter for screening for other disorder: Secondary | ICD-10-CM | POA: Diagnosis not present

## 2014-06-11 DIAGNOSIS — I10 Essential (primary) hypertension: Secondary | ICD-10-CM | POA: Diagnosis not present

## 2014-06-11 DIAGNOSIS — R7309 Other abnormal glucose: Secondary | ICD-10-CM | POA: Diagnosis not present

## 2014-06-11 DIAGNOSIS — Z6828 Body mass index (BMI) 28.0-28.9, adult: Secondary | ICD-10-CM | POA: Diagnosis not present

## 2014-06-11 DIAGNOSIS — E782 Mixed hyperlipidemia: Secondary | ICD-10-CM | POA: Diagnosis not present

## 2014-06-11 DIAGNOSIS — E039 Hypothyroidism, unspecified: Secondary | ICD-10-CM | POA: Diagnosis not present

## 2014-06-11 DIAGNOSIS — E559 Vitamin D deficiency, unspecified: Secondary | ICD-10-CM | POA: Diagnosis not present

## 2014-11-08 DIAGNOSIS — Z6827 Body mass index (BMI) 27.0-27.9, adult: Secondary | ICD-10-CM | POA: Diagnosis not present

## 2014-11-08 DIAGNOSIS — G500 Trigeminal neuralgia: Secondary | ICD-10-CM | POA: Diagnosis not present

## 2014-12-11 DIAGNOSIS — E039 Hypothyroidism, unspecified: Secondary | ICD-10-CM | POA: Diagnosis not present

## 2014-12-11 DIAGNOSIS — Z23 Encounter for immunization: Secondary | ICD-10-CM | POA: Diagnosis not present

## 2014-12-11 DIAGNOSIS — E782 Mixed hyperlipidemia: Secondary | ICD-10-CM | POA: Diagnosis not present

## 2014-12-11 DIAGNOSIS — I10 Essential (primary) hypertension: Secondary | ICD-10-CM | POA: Diagnosis not present

## 2014-12-11 DIAGNOSIS — Z6828 Body mass index (BMI) 28.0-28.9, adult: Secondary | ICD-10-CM | POA: Diagnosis not present

## 2014-12-11 DIAGNOSIS — Z9181 History of falling: Secondary | ICD-10-CM | POA: Diagnosis not present

## 2014-12-11 DIAGNOSIS — Z1389 Encounter for screening for other disorder: Secondary | ICD-10-CM | POA: Diagnosis not present

## 2014-12-11 DIAGNOSIS — E559 Vitamin D deficiency, unspecified: Secondary | ICD-10-CM | POA: Diagnosis not present

## 2014-12-11 DIAGNOSIS — R7303 Prediabetes: Secondary | ICD-10-CM | POA: Diagnosis not present

## 2015-03-10 HISTORY — PX: CATARACT EXTRACTION W/ INTRAOCULAR LENS IMPLANT: SHX1309

## 2015-06-13 DIAGNOSIS — E782 Mixed hyperlipidemia: Secondary | ICD-10-CM | POA: Diagnosis not present

## 2015-06-13 DIAGNOSIS — E559 Vitamin D deficiency, unspecified: Secondary | ICD-10-CM | POA: Diagnosis not present

## 2015-06-13 DIAGNOSIS — R7303 Prediabetes: Secondary | ICD-10-CM | POA: Diagnosis not present

## 2015-06-13 DIAGNOSIS — E039 Hypothyroidism, unspecified: Secondary | ICD-10-CM | POA: Diagnosis not present

## 2015-06-18 DIAGNOSIS — E782 Mixed hyperlipidemia: Secondary | ICD-10-CM | POA: Diagnosis not present

## 2015-06-18 DIAGNOSIS — Z6828 Body mass index (BMI) 28.0-28.9, adult: Secondary | ICD-10-CM | POA: Diagnosis not present

## 2015-06-18 DIAGNOSIS — E039 Hypothyroidism, unspecified: Secondary | ICD-10-CM | POA: Diagnosis not present

## 2015-06-18 DIAGNOSIS — R7303 Prediabetes: Secondary | ICD-10-CM | POA: Diagnosis not present

## 2015-06-18 DIAGNOSIS — E559 Vitamin D deficiency, unspecified: Secondary | ICD-10-CM | POA: Diagnosis not present

## 2015-06-18 DIAGNOSIS — I10 Essential (primary) hypertension: Secondary | ICD-10-CM | POA: Diagnosis not present

## 2015-08-19 DIAGNOSIS — G500 Trigeminal neuralgia: Secondary | ICD-10-CM | POA: Diagnosis not present

## 2015-08-19 DIAGNOSIS — Z6828 Body mass index (BMI) 28.0-28.9, adult: Secondary | ICD-10-CM | POA: Diagnosis not present

## 2015-08-31 DIAGNOSIS — H40033 Anatomical narrow angle, bilateral: Secondary | ICD-10-CM | POA: Diagnosis not present

## 2015-08-31 DIAGNOSIS — H25813 Combined forms of age-related cataract, bilateral: Secondary | ICD-10-CM | POA: Diagnosis not present

## 2015-09-12 DIAGNOSIS — H2513 Age-related nuclear cataract, bilateral: Secondary | ICD-10-CM | POA: Diagnosis not present

## 2015-09-12 DIAGNOSIS — H52222 Regular astigmatism, left eye: Secondary | ICD-10-CM | POA: Diagnosis not present

## 2015-09-17 DIAGNOSIS — H2513 Age-related nuclear cataract, bilateral: Secondary | ICD-10-CM | POA: Diagnosis not present

## 2015-09-17 DIAGNOSIS — H2511 Age-related nuclear cataract, right eye: Secondary | ICD-10-CM | POA: Diagnosis not present

## 2015-09-17 DIAGNOSIS — H31101 Choroidal degeneration, unspecified, right eye: Secondary | ICD-10-CM | POA: Diagnosis not present

## 2015-09-25 DIAGNOSIS — Z961 Presence of intraocular lens: Secondary | ICD-10-CM | POA: Diagnosis not present

## 2015-09-25 DIAGNOSIS — H1852 Epithelial (juvenile) corneal dystrophy: Secondary | ICD-10-CM | POA: Diagnosis not present

## 2015-09-25 DIAGNOSIS — H1830 Unspecified corneal membrane change: Secondary | ICD-10-CM | POA: Diagnosis not present

## 2015-12-09 DIAGNOSIS — Z87891 Personal history of nicotine dependence: Secondary | ICD-10-CM | POA: Diagnosis not present

## 2015-12-09 DIAGNOSIS — I10 Essential (primary) hypertension: Secondary | ICD-10-CM | POA: Diagnosis not present

## 2015-12-09 DIAGNOSIS — E785 Hyperlipidemia, unspecified: Secondary | ICD-10-CM | POA: Diagnosis not present

## 2015-12-09 DIAGNOSIS — Z90710 Acquired absence of both cervix and uterus: Secondary | ICD-10-CM | POA: Diagnosis not present

## 2015-12-09 DIAGNOSIS — H2512 Age-related nuclear cataract, left eye: Secondary | ICD-10-CM | POA: Diagnosis not present

## 2015-12-09 DIAGNOSIS — Z8541 Personal history of malignant neoplasm of cervix uteri: Secondary | ICD-10-CM | POA: Diagnosis not present

## 2015-12-09 DIAGNOSIS — E039 Hypothyroidism, unspecified: Secondary | ICD-10-CM | POA: Diagnosis not present

## 2015-12-11 DIAGNOSIS — E039 Hypothyroidism, unspecified: Secondary | ICD-10-CM | POA: Diagnosis not present

## 2015-12-11 DIAGNOSIS — I10 Essential (primary) hypertension: Secondary | ICD-10-CM | POA: Diagnosis not present

## 2015-12-11 DIAGNOSIS — E782 Mixed hyperlipidemia: Secondary | ICD-10-CM | POA: Diagnosis not present

## 2015-12-18 DIAGNOSIS — Z23 Encounter for immunization: Secondary | ICD-10-CM | POA: Diagnosis not present

## 2015-12-18 DIAGNOSIS — I10 Essential (primary) hypertension: Secondary | ICD-10-CM | POA: Diagnosis not present

## 2015-12-18 DIAGNOSIS — R7303 Prediabetes: Secondary | ICD-10-CM | POA: Diagnosis not present

## 2015-12-18 DIAGNOSIS — E039 Hypothyroidism, unspecified: Secondary | ICD-10-CM | POA: Diagnosis not present

## 2015-12-18 DIAGNOSIS — Z9181 History of falling: Secondary | ICD-10-CM | POA: Diagnosis not present

## 2015-12-18 DIAGNOSIS — Z1389 Encounter for screening for other disorder: Secondary | ICD-10-CM | POA: Diagnosis not present

## 2015-12-18 DIAGNOSIS — E663 Overweight: Secondary | ICD-10-CM | POA: Diagnosis not present

## 2015-12-18 DIAGNOSIS — E782 Mixed hyperlipidemia: Secondary | ICD-10-CM | POA: Diagnosis not present

## 2016-01-22 ENCOUNTER — Other Ambulatory Visit: Payer: Self-pay | Admitting: *Deleted

## 2016-01-22 DIAGNOSIS — I728 Aneurysm of other specified arteries: Secondary | ICD-10-CM

## 2016-02-17 ENCOUNTER — Encounter (HOSPITAL_COMMUNITY): Payer: Self-pay

## 2016-02-17 ENCOUNTER — Encounter: Payer: Self-pay | Admitting: Surgery

## 2016-03-31 ENCOUNTER — Encounter: Payer: Self-pay | Admitting: Surgery

## 2016-04-06 ENCOUNTER — Encounter: Payer: Self-pay | Admitting: Surgery

## 2016-04-06 ENCOUNTER — Ambulatory Visit (HOSPITAL_COMMUNITY)
Admission: RE | Admit: 2016-04-06 | Discharge: 2016-04-06 | Disposition: A | Discharge status: Home / Self Care | Payer: BLUE CROSS/BLUE SHIELD | Source: Ambulatory Visit | Attending: Surgery | Admitting: Surgery

## 2016-04-06 ENCOUNTER — Ambulatory Visit (INDEPENDENT_AMBULATORY_CARE_PROVIDER_SITE_OTHER): Payer: BLUE CROSS/BLUE SHIELD | Admitting: Surgery

## 2016-04-06 VITALS — BP 129/73 | HR 61 | Temp 98.0°F | Resp 16 | Ht 66.0 in | Wt 175.0 lb

## 2016-04-06 DIAGNOSIS — I728 Aneurysm of other specified arteries: Secondary | ICD-10-CM | POA: Insufficient documentation

## 2016-04-06 NOTE — Progress Notes (Signed)
[facility]  Patient name: [name] MRN: [id number] DOB: 21-Sep-1940 Sex: female   REFERRING PROVIDER:    Cyndi Bender   REASON FOR CONSULT:    Celiac aneurysm  HISTORY OF PRESENT ILLNESS:   [name] is a 76 y.o. 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  This was initially detected in 2012 during a workup for abdominal pain.  She recently had this imaged in 2014 where it measures 1.5 mm.  She denies having any abdominal pain.  Patient suffers from hypertension which is medically managed.  She has hypercholesterolemia which is treated with fish oil and a statin.  She has hypothyroidism, on levothyroxine.  She suffers from prediabetes and is managing this with diet.  Her hemoglobin A1c was 6.5 in April.  She is a former smoker  PAST MEDICAL HISTORY    Past Medical History:  Diagnosis Date  . Hyperlipidemia   . Hypertension   . Hypothyroidism   . Squamous cell carcinoma of cervix ([place]) 04/2006   Stage IB1     FAMILY HISTORY   Family History  Problem Relation Age of Onset  . Skin cancer Father     SOCIAL HISTORY:   Social History   Social History  . Marital status: Widowed    Spouse name: N/A  . Number of children: N/A  . Years of education: N/A   Occupational History  . Not on file.   Social History Main Topics  . Smoking status: Former Smoker    Quit date: 12/08/2003  . Smokeless tobacco: Never Used  . Alcohol use No  . Drug use: No  . Sexual activity: No   Other Topics Concern  . Not on file   Social History Narrative  . No narrative on file    ALLERGIES:    Allergies  Allergen Reactions  . Codeine Anxiety    "Jumpy inside"    CURRENT MEDICATIONS:    Current Outpatient Prescriptions  Medication Sig Dispense Refill  . cholecalciferol (VITAMIN D) 1000 UNITS tablet Take 1,000 Units by mouth daily.      . fish oil-omega-3 fatty acids 1000  MG capsule Take 4 capsules by mouth daily. Dose 1200mg / 360mg  Omega 3     . HYDROCHLOROTHIAZIDE PO Take 12.5 mg by mouth daily.     . Levothyroxine Sodium (SYNTHROID PO) Take 75 mcg by mouth daily. One tablet daily for two days then 1/2 tablet    . OXcarbazepine (TRILEPTAL) 150 MG tablet Take 150 mg by mouth 2 (two) times daily.      . Rosuvastatin Calcium (CRESTOR PO) Take 5 mg by mouth daily.      No current facility-administered medications for this visit.     REVIEW OF SYSTEMS:   [X]  denotes positive finding, [ ]  denotes negative finding Cardiac  Comments:  Chest pain or chest pressure:    Shortness of breath upon exertion:    Short of breath when lying flat:    Irregular heart rhythm:        Vascular    Pain in calf, thigh, or hip brought on by ambulation:    Pain in feet at night that wakes you up from your sleep:     Blood clot in your veins:    Leg swelling:         Pulmonary    Oxygen at home:    Productive cough:     Wheezing:  Neurologic    Sudden weakness in arms or legs:     Sudden numbness in arms or legs:     Sudden onset of difficulty speaking or slurred speech:    Temporary loss of vision in one eye:     Problems with dizziness:         Gastrointestinal    Blood in stool:      Vomited blood:         Genitourinary    Burning when urinating:     Blood in urine:        Psychiatric    Major depression:         Hematologic    Bleeding problems:    Problems with blood clotting too easily:        Skin    Rashes or ulcers:        Constitutional    Fever or chills:     PHYSICAL EXAM:   Vitals:   04/06/16 0926  BP: 129/73  Pulse: 61  Resp: 16  Temp: 98 F (36.7 C)  TempSrc: Oral  SpO2: 94%  Weight: 175 lb (79.4 kg)  Height: 5\' 6"  (1.676 m)    GENERAL: The patient is a well-nourished female, in no acute distress. The vital signs are documented above. CARDIAC: There is a regular rate and rhythm.  VASCULAR: No carotid  bruits PULMONARY: Nonlabored respirations ABDOMEN: Soft and non-tender with normal pitched bowel sounds.  MUSCULOSKELETAL: There are no major deformities or cyanosis. NEUROLOGIC: No focal weakness or paresthesias are detected. SKIN: There are no ulcers or rashes noted. PSYCHIATRIC: The patient has a normal affect.  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  The study was 2014.  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  We'll continue to follow this with annual surveillance.  Appendix study will be an ultrasound study, so as to minimize radiation from CT scan imaging.   [name], MD [facility] of [facility] [phone] Pager [phone]

## 2016-04-07 ENCOUNTER — Encounter: Payer: Self-pay | Admitting: Family Medicine

## 2016-04-08 NOTE — Addendum Note (Signed)
Addended by: Lianne Cure A on: 04/08/2016 04:54 PM   Modules accepted: Orders

## 2016-05-21 DIAGNOSIS — Z6829 Body mass index (BMI) 29.0-29.9, adult: Secondary | ICD-10-CM | POA: Diagnosis not present

## 2016-05-21 DIAGNOSIS — G500 Trigeminal neuralgia: Secondary | ICD-10-CM | POA: Diagnosis not present

## 2016-06-10 DIAGNOSIS — I10 Essential (primary) hypertension: Secondary | ICD-10-CM | POA: Diagnosis not present

## 2016-06-10 DIAGNOSIS — E782 Mixed hyperlipidemia: Secondary | ICD-10-CM | POA: Diagnosis not present

## 2016-06-10 DIAGNOSIS — E559 Vitamin D deficiency, unspecified: Secondary | ICD-10-CM | POA: Diagnosis not present

## 2016-06-10 DIAGNOSIS — E039 Hypothyroidism, unspecified: Secondary | ICD-10-CM | POA: Diagnosis not present

## 2016-06-17 DIAGNOSIS — I10 Essential (primary) hypertension: Secondary | ICD-10-CM | POA: Diagnosis not present

## 2016-06-17 DIAGNOSIS — Z6829 Body mass index (BMI) 29.0-29.9, adult: Secondary | ICD-10-CM | POA: Diagnosis not present

## 2016-06-17 DIAGNOSIS — R7303 Prediabetes: Secondary | ICD-10-CM | POA: Diagnosis not present

## 2016-06-17 DIAGNOSIS — E039 Hypothyroidism, unspecified: Secondary | ICD-10-CM | POA: Diagnosis not present

## 2016-06-17 DIAGNOSIS — E782 Mixed hyperlipidemia: Secondary | ICD-10-CM | POA: Diagnosis not present

## 2016-07-15 DIAGNOSIS — J019 Acute sinusitis, unspecified: Secondary | ICD-10-CM | POA: Diagnosis not present

## 2016-07-15 DIAGNOSIS — Z6828 Body mass index (BMI) 28.0-28.9, adult: Secondary | ICD-10-CM | POA: Diagnosis not present

## 2016-12-21 DIAGNOSIS — E782 Mixed hyperlipidemia: Secondary | ICD-10-CM | POA: Diagnosis not present

## 2016-12-21 DIAGNOSIS — E039 Hypothyroidism, unspecified: Secondary | ICD-10-CM | POA: Diagnosis not present

## 2016-12-21 DIAGNOSIS — E559 Vitamin D deficiency, unspecified: Secondary | ICD-10-CM | POA: Diagnosis not present

## 2016-12-21 DIAGNOSIS — I10 Essential (primary) hypertension: Secondary | ICD-10-CM | POA: Diagnosis not present

## 2016-12-24 DIAGNOSIS — Z9181 History of falling: Secondary | ICD-10-CM | POA: Diagnosis not present

## 2016-12-24 DIAGNOSIS — Z6829 Body mass index (BMI) 29.0-29.9, adult: Secondary | ICD-10-CM | POA: Diagnosis not present

## 2016-12-24 DIAGNOSIS — E559 Vitamin D deficiency, unspecified: Secondary | ICD-10-CM | POA: Diagnosis not present

## 2016-12-24 DIAGNOSIS — Z1389 Encounter for screening for other disorder: Secondary | ICD-10-CM | POA: Diagnosis not present

## 2016-12-24 DIAGNOSIS — I10 Essential (primary) hypertension: Secondary | ICD-10-CM | POA: Diagnosis not present

## 2016-12-24 DIAGNOSIS — R7303 Prediabetes: Secondary | ICD-10-CM | POA: Diagnosis not present

## 2016-12-24 DIAGNOSIS — E039 Hypothyroidism, unspecified: Secondary | ICD-10-CM | POA: Diagnosis not present

## 2016-12-24 DIAGNOSIS — Z23 Encounter for immunization: Secondary | ICD-10-CM | POA: Diagnosis not present

## 2016-12-24 DIAGNOSIS — E782 Mixed hyperlipidemia: Secondary | ICD-10-CM | POA: Diagnosis not present

## 2017-02-04 DIAGNOSIS — E039 Hypothyroidism, unspecified: Secondary | ICD-10-CM | POA: Diagnosis not present

## 2017-04-12 ENCOUNTER — Encounter: Payer: Self-pay | Admitting: Family

## 2017-04-12 ENCOUNTER — Ambulatory Visit (INDEPENDENT_AMBULATORY_CARE_PROVIDER_SITE_OTHER): Payer: BLUE CROSS/BLUE SHIELD | Admitting: Family

## 2017-04-12 ENCOUNTER — Ambulatory Visit (HOSPITAL_COMMUNITY)
Admission: RE | Admit: 2017-04-12 | Discharge: 2017-04-12 | Disposition: A | Discharge status: Home / Self Care | Payer: BLUE CROSS/BLUE SHIELD | Source: Ambulatory Visit | Attending: Family | Admitting: Family

## 2017-04-12 VITALS — BP 126/79 | HR 61 | Temp 97.8°F | Resp 17 | Ht 66.0 in | Wt 174.2 lb

## 2017-04-12 DIAGNOSIS — I728 Aneurysm of other specified arteries: Secondary | ICD-10-CM

## 2017-04-12 NOTE — Progress Notes (Signed)
CC: Follow up Celiac Artery Aneurysm  History of Present Illness  [name] is a 77 y.o. (Oct 20, 1940) female was referred to Dr. 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 Dr. Trula Slade evaluated pt on 04-06-16. 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  She had this imaged in 2014 where it measures 1.5 mm.  She denies having any abdominal pain.  Patient suffers from hypertension which is medically managed.  She has hypercholesterolemia which is treated with fish oil and a statin.  She has hypothyroidism, on levothyroxine.  She suffers from prediabetes and is managing this with diet.  Her hemoglobin A1c was 6.5 in April 2017  She is a former smoker.  Dr. Trula Slade last evaluated pt on 04-06-16. 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 Trula Slade discussed with the patient that he would not consider elective repair until she has a diameter greater than 2 cm.  We'll continue to follow this with annual surveillance. Next study will be an ultrasound study, so as to minimize radiation from CT scan imaging.  Pt denies claudication sx's with walking, denies any known hx of TIA or stroke.   The patient notes no food fear.  The patient notes no weight loss.  Diabetic: yes Tobacco use: quit about 2008, started smoking about age 59, quit several times   Past Medical History:  Diagnosis Date  . Hyperlipidemia   . Hypertension   . Hypothyroidism   . Squamous cell carcinoma of cervix ([place]) 04/2006   Stage IB1    Social History Social History   Tobacco Use  . Smoking status: Former Smoker    Last attempt to quit: 12/08/2003    Years since quitting: 13.3  . Smokeless tobacco: Never Used  Substance Use Topics  . Alcohol use: No  . Drug use: No    Family History Family History    Problem Relation Age of Onset  . Skin cancer Father     Surgical History Past Surgical History:  Procedure Laterality Date  . ABDOMINAL HYSTERECTOMY     Pelvic lymphadenectomy  . SUPRAPUBIC CATHETER PLACEMENT      Allergies  Allergen Reactions  . Codeine Anxiety    "Jumpy inside"    Current Outpatient Medications  Medication Sig Dispense Refill  . cholecalciferol (VITAMIN D) 1000 UNITS tablet Take 1,000 Units by mouth daily.      . fish oil-omega-3 fatty acids 1000 MG capsule Take 4 capsules by mouth daily. Dose 1200mg / 360mg  Omega 3     . HYDROCHLOROTHIAZIDE PO Take 12.5 mg by mouth daily.     . Levothyroxine Sodium (SYNTHROID PO) Take 75 mcg by mouth daily. One tablet daily for two days then 1/2 tablet    . OXcarbazepine (TRILEPTAL) 150 MG tablet Take 150 mg by mouth 2 (two) times daily.      . Rosuvastatin Calcium (CRESTOR PO) Take 5 mg by mouth daily.      No current facility-administered medications for this visit.     ROS: see HPI for pertinent positives and negatives    Physical Examination  Vitals:   04/12/17 0938  BP: 126/79  Pulse: 61  Resp: 17  Temp: 97.8 F (36.6 C)  TempSrc: Oral  SpO2: 100%  Weight: 174 lb 3.2 oz (79 kg)  Height: 5\' 6"  (1.676 m)   Body mass index is 28.12 kg/m.  GENERAL: The patient is a well-nourished female, in no acute distress. The vital signs are documented above. CARDIAC: There is a regular rate and rhythm.  VASCULAR: No carotid bruits PULMONARY: Nonlabored respirations ABDOMEN: Soft and non-tender with normal pitched bowel sounds.  MUSCULOSKELETAL: There are no major deformities or cyanosis. NEUROLOGIC: No focal weakness or paresthesias are detected. SKIN: There are no ulcers or rashes noted. PSYCHIATRIC: The patient has a normal affect.     DATA Abdominal Visceral Duplex (02/04/190: PSV: Common Hepatic: 208 cm/s Splenic Artery: 159 cm/s Maximum diameter of Celiac Artery: 1.5 cm x 1.5 cm No change compared to  duplex on 04-06-16.   Medical Decision Making  [name] is a 77 y.o. 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    Based on her exam and studies, I have offered the patient mesenteric duplex in one year.  Thank you for allowing [place] to participate in this patient's care.  Clemon Chambers, RN, MSN, FNP-C [facility] [place] Office: [phone]  Clinic MD: Trula Slade  04/12/2017, 9:54 AM

## 2017-04-12 NOTE — Patient Instructions (Signed)
Before your next abdominal ultrasound:  Take two Extra-Strength Gas-X capsules at bedtime the night before the test. Take another two Extra-Strength Gas-X capsules 3 hours before the test.  Avoid gas forming foods the day before the test.       

## 2017-06-15 DIAGNOSIS — G500 Trigeminal neuralgia: Secondary | ICD-10-CM | POA: Diagnosis not present

## 2017-06-28 DIAGNOSIS — E039 Hypothyroidism, unspecified: Secondary | ICD-10-CM | POA: Diagnosis not present

## 2017-06-28 DIAGNOSIS — I10 Essential (primary) hypertension: Secondary | ICD-10-CM | POA: Diagnosis not present

## 2017-06-28 DIAGNOSIS — E782 Mixed hyperlipidemia: Secondary | ICD-10-CM | POA: Diagnosis not present

## 2017-07-01 DIAGNOSIS — R7303 Prediabetes: Secondary | ICD-10-CM | POA: Diagnosis not present

## 2017-07-01 DIAGNOSIS — E782 Mixed hyperlipidemia: Secondary | ICD-10-CM | POA: Diagnosis not present

## 2017-07-01 DIAGNOSIS — E039 Hypothyroidism, unspecified: Secondary | ICD-10-CM | POA: Diagnosis not present

## 2017-07-01 DIAGNOSIS — I10 Essential (primary) hypertension: Secondary | ICD-10-CM | POA: Diagnosis not present

## 2017-08-18 DIAGNOSIS — J069 Acute upper respiratory infection, unspecified: Secondary | ICD-10-CM | POA: Diagnosis not present

## 2017-08-18 DIAGNOSIS — Z6827 Body mass index (BMI) 27.0-27.9, adult: Secondary | ICD-10-CM | POA: Diagnosis not present

## 2017-08-23 DIAGNOSIS — J019 Acute sinusitis, unspecified: Secondary | ICD-10-CM | POA: Diagnosis not present

## 2017-08-31 DIAGNOSIS — J019 Acute sinusitis, unspecified: Secondary | ICD-10-CM | POA: Diagnosis not present

## 2017-08-31 DIAGNOSIS — J209 Acute bronchitis, unspecified: Secondary | ICD-10-CM | POA: Diagnosis not present

## 2017-09-07 DIAGNOSIS — I10 Essential (primary) hypertension: Secondary | ICD-10-CM | POA: Diagnosis not present

## 2017-09-07 DIAGNOSIS — R42 Dizziness and giddiness: Secondary | ICD-10-CM | POA: Diagnosis not present

## 2017-09-07 DIAGNOSIS — Z6827 Body mass index (BMI) 27.0-27.9, adult: Secondary | ICD-10-CM | POA: Diagnosis not present

## 2017-09-08 DIAGNOSIS — H538 Other visual disturbances: Secondary | ICD-10-CM | POA: Diagnosis not present

## 2017-09-08 DIAGNOSIS — R51 Headache: Secondary | ICD-10-CM | POA: Diagnosis not present

## 2017-09-08 DIAGNOSIS — R42 Dizziness and giddiness: Secondary | ICD-10-CM | POA: Diagnosis not present

## 2017-09-08 DIAGNOSIS — R05 Cough: Secondary | ICD-10-CM | POA: Diagnosis not present

## 2017-12-31 DIAGNOSIS — I10 Essential (primary) hypertension: Secondary | ICD-10-CM | POA: Diagnosis not present

## 2017-12-31 DIAGNOSIS — E039 Hypothyroidism, unspecified: Secondary | ICD-10-CM | POA: Diagnosis not present

## 2017-12-31 DIAGNOSIS — E782 Mixed hyperlipidemia: Secondary | ICD-10-CM | POA: Diagnosis not present

## 2018-01-05 DIAGNOSIS — E039 Hypothyroidism, unspecified: Secondary | ICD-10-CM | POA: Diagnosis not present

## 2018-01-05 DIAGNOSIS — R7303 Prediabetes: Secondary | ICD-10-CM | POA: Diagnosis not present

## 2018-01-05 DIAGNOSIS — I10 Essential (primary) hypertension: Secondary | ICD-10-CM | POA: Diagnosis not present

## 2018-01-05 DIAGNOSIS — Z1331 Encounter for screening for depression: Secondary | ICD-10-CM | POA: Diagnosis not present

## 2018-01-05 DIAGNOSIS — E782 Mixed hyperlipidemia: Secondary | ICD-10-CM | POA: Diagnosis not present

## 2018-01-05 DIAGNOSIS — Z23 Encounter for immunization: Secondary | ICD-10-CM | POA: Diagnosis not present

## 2018-03-09 DIAGNOSIS — Z87412 Personal history of vulvar dysplasia: Secondary | ICD-10-CM

## 2018-03-09 HISTORY — DX: Personal history of vulvar dysplasia: Z87.412

## 2018-04-05 DIAGNOSIS — J40 Bronchitis, not specified as acute or chronic: Secondary | ICD-10-CM | POA: Diagnosis not present

## 2018-04-05 DIAGNOSIS — R6889 Other general symptoms and signs: Secondary | ICD-10-CM | POA: Diagnosis not present

## 2018-04-29 ENCOUNTER — Other Ambulatory Visit: Payer: Self-pay

## 2018-04-29 DIAGNOSIS — I728 Aneurysm of other specified arteries: Secondary | ICD-10-CM

## 2018-05-04 ENCOUNTER — Ambulatory Visit (HOSPITAL_COMMUNITY)
Admission: RE | Admit: 2018-05-04 | Discharge: 2018-05-04 | Disposition: A | Discharge status: Home / Self Care | Payer: BLUE CROSS/BLUE SHIELD | Source: Ambulatory Visit | Attending: Vascular Surgery | Admitting: Vascular Surgery

## 2018-05-04 ENCOUNTER — Ambulatory Visit (INDEPENDENT_AMBULATORY_CARE_PROVIDER_SITE_OTHER): Payer: BLUE CROSS/BLUE SHIELD | Admitting: Physician Assistant

## 2018-05-04 ENCOUNTER — Other Ambulatory Visit: Payer: Self-pay

## 2018-05-04 DIAGNOSIS — I728 Aneurysm of other specified arteries: Secondary | ICD-10-CM | POA: Insufficient documentation

## 2018-05-04 NOTE — Progress Notes (Signed)
    Established Celiac Artery Aneurysm   History of Present Illness   [name] is a 78 y.o. (Oct 20, 1940) female who presents with chief complaint: follow up for celiac artery aneurysm.  Previous studies demonstrate an celiac artery measuring 1.3 cm by duplex.  CTA abd/pelvis from 2014 shows 1.5cm celiac aneurysm.  Patient denies any new or changing abdominal pain.  She follows up with her PCP for management of hypercholesterolemia which she states is well controlled on statin therapy.  She is a former smoker who quit in 2006.      The patient's PMH, PSH, SH, and FamHx were reviewed and are unchanged from prior visit.  Current Outpatient Medications  Medication Sig Dispense Refill  . cholecalciferol (VITAMIN D) 1000 UNITS tablet Take 1,000 Units by mouth daily.      . fish oil-omega-3 fatty acids 1000 MG capsule Take 4 capsules by mouth daily. Dose 1200mg / 360mg  Omega 3     . HYDROCHLOROTHIAZIDE PO Take 12.5 mg by mouth daily.     . Levothyroxine Sodium (SYNTHROID PO) Take 75 mcg by mouth daily. One tablet daily for two days then 1/2 tablet    . OXcarbazepine (TRILEPTAL) 150 MG tablet Take 150 mg by mouth 2 (two) times daily.      . Rosuvastatin Calcium (CRESTOR PO) Take 5 mg by mouth daily.      No current facility-administered medications for this visit.     On ROS today: 10 system ROS is negative unless otherwise noted in HPI   Physical Examination   Vitals:   05/04/18 0915  BP: 120/68  Pulse: 66  Resp: 18  Temp: 97.7 F (36.5 C)  SpO2: 97%  Weight: 168 lb 8.7 oz (76.5 kg)  Height: 5\' 6"  (1.676 m)   Body mass index is 27.2 kg/m.  General Alert, O x 3, WD, NAD  Pulmonary Sym exp, good B air movt  Cardiac RRR, Nl S1, S2  Vascular Vessel Right Left  Radial Palpable Palpable  Aorta Not palpable N/A  PT Faintly palpable Not palpable  DP Not palpable Faintly palpable    Gastro- intestinal soft, non-distended, non-tender to palpation,   Musculo- skeletal M/S 5/5  throughout  , Extremities without ischemic changes  , No edema present, No visible varicosities   Neurologic A&O; CN grossly intact     Non-Invasive Vascular Imaging   Abd Duplex (05/04/18)  Celiac aneurysm not well visualized on exam   Medical Decision Making   [name] is a 78 y.o. (Sep 15, 1940) female who presents with: asymptomatic celiac artery aneurysm   Celiac artery aneurysm not well visualized today  Check CTA abd/pelvis in 1 year  Follow up with PCP for management of hypercholesterolemia and other chronic medical conditions   Dagoberto Ligas PA-C [facility] [place] Office: [phone]  Clinic MD: Dr. Scot Dock

## 2018-05-13 DIAGNOSIS — J019 Acute sinusitis, unspecified: Secondary | ICD-10-CM | POA: Diagnosis not present

## 2018-05-13 DIAGNOSIS — J209 Acute bronchitis, unspecified: Secondary | ICD-10-CM | POA: Diagnosis not present

## 2018-05-13 DIAGNOSIS — Z6827 Body mass index (BMI) 27.0-27.9, adult: Secondary | ICD-10-CM | POA: Diagnosis not present

## 2018-09-12 ENCOUNTER — Inpatient Hospital Stay: Payer: Medicare Other | Attending: Gynecologic Oncology | Admitting: Gynecologic Oncology

## 2018-09-12 ENCOUNTER — Encounter: Payer: Self-pay | Admitting: Gynecologic Oncology

## 2018-09-12 ENCOUNTER — Telehealth: Payer: Self-pay | Admitting: Gynecologic Oncology

## 2018-09-12 ENCOUNTER — Other Ambulatory Visit: Payer: Self-pay

## 2018-09-12 VITALS — BP 124/80 | HR 86 | Temp 98.2°F | Resp 20 | Ht 65.0 in | Wt 158.6 lb

## 2018-09-12 DIAGNOSIS — Z90722 Acquired absence of ovaries, bilateral: Secondary | ICD-10-CM | POA: Diagnosis not present

## 2018-09-12 DIAGNOSIS — G500 Trigeminal neuralgia: Secondary | ICD-10-CM | POA: Insufficient documentation

## 2018-09-12 DIAGNOSIS — E039 Hypothyroidism, unspecified: Secondary | ICD-10-CM | POA: Diagnosis not present

## 2018-09-12 DIAGNOSIS — G893 Neoplasm related pain (acute) (chronic): Secondary | ICD-10-CM

## 2018-09-12 DIAGNOSIS — Z79899 Other long term (current) drug therapy: Secondary | ICD-10-CM | POA: Diagnosis not present

## 2018-09-12 DIAGNOSIS — E785 Hyperlipidemia, unspecified: Secondary | ICD-10-CM | POA: Diagnosis not present

## 2018-09-12 DIAGNOSIS — Z87891 Personal history of nicotine dependence: Secondary | ICD-10-CM | POA: Insufficient documentation

## 2018-09-12 DIAGNOSIS — N9089 Other specified noninflammatory disorders of vulva and perineum: Secondary | ICD-10-CM | POA: Insufficient documentation

## 2018-09-12 DIAGNOSIS — D071 Carcinoma in situ of vulva: Secondary | ICD-10-CM | POA: Insufficient documentation

## 2018-09-12 DIAGNOSIS — I10 Essential (primary) hypertension: Secondary | ICD-10-CM | POA: Insufficient documentation

## 2018-09-12 DIAGNOSIS — Z7982 Long term (current) use of aspirin: Secondary | ICD-10-CM | POA: Diagnosis not present

## 2018-09-12 DIAGNOSIS — Z8541 Personal history of malignant neoplasm of cervix uteri: Secondary | ICD-10-CM | POA: Insufficient documentation

## 2018-09-12 DIAGNOSIS — Z90710 Acquired absence of both cervix and uterus: Secondary | ICD-10-CM | POA: Diagnosis not present

## 2018-09-12 DIAGNOSIS — C519 Malignant neoplasm of vulva, unspecified: Secondary | ICD-10-CM

## 2018-09-12 MED ORDER — TRAMADOL HCL 50 MG PO TABS: 50.0000 mg | ORAL_TABLET | Freq: Four times a day (QID) | ORAL | 0 refills | Status: DC | PRN

## 2018-09-12 MED ORDER — IBUPROFEN 600 MG PO TABS: 600.0000 mg | ORAL_TABLET | Freq: Four times a day (QID) | ORAL | 0 refills | Status: DC | PRN

## 2018-09-12 MED ORDER — SENNOSIDES-DOCUSATE SODIUM 8.6-50 MG PO TABS: 2.0000 | ORAL_TABLET | Freq: Every day | ORAL | 0 refills | Status: DC

## 2018-09-12 NOTE — Telephone Encounter (Signed)
Called daughter and informed her that her mother's surgery would be on September 27, 2018 with Dr. Alycia Rossetti.  She asks that her mother's results for her PET scan be called to her since her mother gets confused with things at times. Advised to call for any needs.

## 2018-09-12 NOTE — Progress Notes (Signed)
Consult Note: Gyn-Onc  Consult was requested by Dr. Modesta Messing for the evaluation of [name] 78 y.o. female  CC:  Chief Complaint  Patient presents with  . Vulvar intraepithelial neoplasia III (VIN III)    Assessment/Plan:  Ms. Opal Dinning  is a 78 y.o.  year old with apparent stage IB vulvar carcinoma.  I suspect this based on physical exam despite the biopsy before showing VIN 3.  I suspect this was under sampling error.  We will follow-up the result of today's biopsy.  If this confirms invasive carcinoma of the vulvar I am recommending an anterior radical vulvectomy with bilateral inguinal lymphadectomy.  Before surgery I recommend a PET to rule out distant metastases.  I explained to the patient and her daughter who was attending via phone my suspicions and the plan of treatment.  I discussed the risks of surgery particular wound separation infection, lymphocyst, lymphedema, infection in the groins.  I discussed wound management and drainage management I explained that the drains may need to be in place for a month or so.  The patient and her daughter seem to be in agreement with this plan.  She is on the schedule for later in July for surgical procedure.   HPI: [name] is a 78 year old P3 who was seen in consultation at the request of Dr. 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  The patient reported experiencing a palpable lump in the winter 2019 and 2020.  This is on the anterior portion of her vulva.  She put off seeing physician for this because she reported that she was busy.  It became more noticeable in April 2020 and she attempted to have an appointment with a gynecologist however she could not get an appointment due to the the coronavirus shot downs.  She was eventually able to see a physician on August 25, 2018 at which time evaluation of the vulva confirmed a vulvar mass.  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  The patient's  medical history is significant for a remote history of cervical cancer diagnosed in 2008.  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 Marti Sleigh.  She has had no other prior abdominal surgeries.  She is an ex-smoker and quit in October 2004.  She is a nondrinker.  She denies urinary incontinence.  Her most significant medical comorbidities right now is trigeminal neuralgia.  She has had gamma knife radiation to treat this.  She is retired.  She lives alone.  Symptoms from this mass include discomfort, and occasional pruritus, and an occasional bloody discharge.  Current Meds:  Outpatient Encounter Medications as of 09/12/2018  Medication Sig  . aspirin 81 MG chewable tablet Chew 81 mg by mouth daily.  . Cholecalciferol (VITAMIN D3) 50 MCG (2000 UT) capsule Take 2,000 Units by mouth daily.   . fish oil-omega-3 fatty acids 1000 MG capsule Take 4 capsules by mouth daily. Dose 1200mg / 360mg  Omega 3   . levothyroxine (SYNTHROID) 75 MCG tablet Take 75 mcg by mouth daily before breakfast.  . Rosuvastatin Calcium (CRESTOR PO) Take 5 mg by mouth daily.   Marland Kitchen ibuprofen (ADVIL) 600 MG tablet Take 1 tablet (600 mg total) by mouth every 6 (six) hours as needed. For AFTER surgery  . Oxcarbazepine (TRILEPTAL) 300 MG tablet Take 150 mg by mouth daily as needed.   . senna-docusate (SENOKOT-S) 8.6-50 MG tablet Take 2 tablets by mouth at bedtime. For AFTER  surgery, do not take if having diarrhea  . traMADol (ULTRAM) 50 MG tablet Take 1 tablet (50 mg total) by mouth every 6 (six) hours as needed. For AFTER surgery, do not take and drive  . VENTOLIN HFA 108 (90 Base) MCG/ACT inhaler INHALE 2 PUFFS BY MOUTH EVERY 4 TO 6 HOURS AS NEEDED FOR SHORTNESS OF BREATH/WHEEZING  . [DISCONTINUED] cholecalciferol (VITAMIN D) 1000 UNITS tablet Take 1,000 Units by mouth daily.    . [DISCONTINUED] HYDROCHLOROTHIAZIDE PO Take 12.5 mg by mouth daily.   . [DISCONTINUED]  Levothyroxine Sodium (SYNTHROID PO) Take 75 mcg by mouth daily. One tablet daily for two days then 1/2 tablet  . [DISCONTINUED] OXcarbazepine (TRILEPTAL) 150 MG tablet Take 150 mg by mouth 2 (two) times daily.     No facility-administered encounter medications on file as of 09/12/2018.     Allergy:  Allergies  Allergen Reactions  . Prednisone Other (See Comments)    Feels Dizzy  . Codeine Anxiety    "Jumpy inside"    Social Hx:   Social History   Socioeconomic History  . Marital status: Widowed    Spouse name: Not on file  . Number of children: Not on file  . Years of education: Not on file  . Highest education level: Not on file  Occupational History  . Not on file  Social Needs  . Financial resource strain: Not on file  . Food insecurity    Worry: Not on file    Inability: Not on file  . Transportation needs    Medical: Not on file    Non-medical: Not on file  Tobacco Use  . Smoking status: Former Smoker    Quit date: 12/08/2003    Years since quitting: 14.7  . Smokeless tobacco: Never Used  Substance and Sexual Activity  . Alcohol use: No  . Drug use: No  . Sexual activity: Never  Lifestyle  . Physical activity    Days per week: Not on file    Minutes per session: Not on file  . Stress: Not on file  Relationships  . Social Herbalist on phone: Not on file    Gets together: Not on file    Attends religious service: Not on file    Active member of club or organization: Not on file    Attends meetings of clubs or organizations: Not on file    Relationship status: Not on file  . Intimate partner violence    Fear of current or ex partner: Not on file    Emotionally abused: Not on file    Physically abused: Not on file    Forced sexual activity: Not on file  Other Topics Concern  . Not on file  Social History Narrative  . Not on file    Past Surgical Hx:  Past Surgical History:  Procedure Laterality Date  . ABDOMINAL HYSTERECTOMY     Pelvic  lymphadenectomy  . SUPRAPUBIC CATHETER PLACEMENT      Past Medical Hx:  Past Medical History:  Diagnosis Date  . Hyperlipidemia   . Hypertension   . Hypothyroidism   . 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  No LMP recorded. Patient is postmenopausal.  Family Hx:  Family History  Problem Relation Age of Onset  . Skin cancer  Father     Review of Systems:  Constitutional  Feels well,   ENT Normal appearing ears and nares bilaterally Skin/Breast  No rash, sores, jaundice, itching, dryness Cardiovascular  No chest pain, shortness of breath, or edema  Pulmonary  No cough or wheeze.  Gastro Intestinal  No nausea, vomitting, or diarrhoea. No bright red blood per rectum, no abdominal pain, change in bowel movement, or constipation.  Genito Urinary  No frequency, urgency, dysuria, + vulvar pruritis and vulvodynia Musculo Skeletal  No myalgia, arthralgia, joint swelling or pain  Neurologic  No weakness, numbness, change in gait,  Psychology  No depression, anxiety, insomnia.   Vitals:  Blood pressure 124/80, pulse 86, temperature 98.2 F (36.8 C), temperature source Oral, resp. rate 20, height 5\' 5"  (1.651 m), weight 158 lb 9.6 oz (71.9 kg), SpO2 97 %.  Physical Exam: WD in NAD Neck  Supple NROM, without any enlargements.  Lymph Node Survey No cervical supraclavicular or inguinal adenopathy Cardiovascular  Pulse normal rate, regularity and rhythm. S1 and S2 normal.  Lungs  Clear to auscultation bilateraly, without wheezes/crackles/rhonchi. Good air movement.  Skin  No rash/lesions/breakdown  Psychiatry  Alert and oriented to person, place, and time  Abdomen  Normoactive bowel sounds, abdomen soft, non-tender and obese without evidence of hernia. Back No CVA tenderness Genito Urinary  Vulva/vagina: A 6 cm exophytic  masses arising from the anterior vulva.  It is mobile and not fixed to underlying structures.  Is at least 2 cm from the urethral meatus, and possibly close to 5 cm however this part of the examination was poorly tolerated due to the patient discomfort.  It grossly appears consistent with malignancy.  Bladder/urethra:  No lesions or masses, well supported bladder  Vagina: no lesions present   Cervix and uterus surgically absent  Adnexa: no palpable masses. Rectal  deferred.  Extremities  No bilateral cyanosis, clubbing or edema.   Procedure Note:  Preop Dx: vulvar mass Postop Dx: same Procedure: vulvar biopsy Surgeon: Dorann Ou, MD EBL: 5cc Specimens: vulvar biopsy Complications: none Procedure Details: The procedure was explained to the patient and she offered verbal consent.  Verbal timeout was performed.  The vulva mass was prepped with Betadine.  It was infiltrated with 1 cc of 2% lidocaine.  A 2 mm punch biopsy was used to take multiple punches from representative areas of the vulva mass.  These were placed in the same specimen cup.  Hemostasis was achieved at the biopsy sites with silver nitrate.  The specimen sent for histopathology.  The patient tolerated the procedure well.  Thereasa Solo, MD  09/12/2018, 2:27 PM

## 2018-09-12 NOTE — Patient Instructions (Signed)
Preparing for your Surgery  Plan for surgery at [facility] will be scheduled for a radical anterior vulvectomy, bilateral inguinal lymphadenectomy with drain placement.   Pre-operative Testing -You will receive a phone call from presurgical testing at [facility] if you have not received a call already to arrange for a pre-operative testing appointment before your surgery.  This appointment normally occurs one to two weeks before your scheduled surgery.   -Bring your insurance card, copy of an advanced directive if applicable, medication list  -At that visit, you will be asked to sign a consent for a possible blood transfusion in case a transfusion becomes necessary during surgery.  The need for a blood transfusion is rare but having consent is a necessary part of your care.     -You should not be taking blood thinners or aspirin at least ten days prior to surgery unless instructed by your surgeon.  -Do not take supplements such as fish oil (omega 3), red yeast rice, tumeric before your surgery.  Day Before Surgery at [facility] will be advised to have nothing to eat or drink after midnight the evening before.    Your role in recovery Your role is to become active as soon as directed by your doctor, while still giving yourself time to heal.  Rest when you feel tired. You will be asked to do the following in order to speed your recovery:  - Cough and breathe deeply. This helps toclear and expand your lungs and can prevent pneumonia. You may be given a spirometer to practice deep breathing. A staff member will show you how to use the spirometer. - Do mild physical activity. Walking or moving your legs help your circulation and body functions return to normal. A staff member will help you when you try to walk and will provide you with simple exercises. Do not try to get up or walk alone the first time. - Actively manage your pain. Managing your pain lets you move in  comfort. We will ask you to rate your pain on a scale of zero to 10. It is your responsibility to tell your doctor or nurse where and how much you hurt so your pain can be treated.  Special Considerations -If you are diabetic, you may be placed on insulin after surgery to have closer control over your blood sugars to promote healing and recovery.  This does not mean that you will be discharged on insulin.  If applicable, your oral antidiabetics will be resumed when you are tolerating a solid diet.  -Your final pathology results from surgery should be available around one week after surgery and the results will be relayed to you when available.  -Dr. Lahoma Crocker is the surgeon that assists your GYN Oncologist with surgery.  If you end up staying the night, the next day after your surgery you will either see Dr. Denman George or Dr. Lahoma Crocker.  -FMLA forms can be faxed to [phone] and please allow 5-7 business days for completion.  Pain Management After Surgery -You have been prescribed your pain medication and bowel regimen medications before surgery so that you can have these available when you are discharged from the hospital. The pain medication is for use ONLY AFTER surgery and a new prescription will not be given.   -Make sure that you have Tylenol and Ibuprofen at home to use on a regular basis after surgery for pain control. We recommend alternating the medications every hour to six  hours since they work differently and are processed in the body differently for pain relief.  -Review the attached handout on narcotic use and their risks and side effects.   Bowel Regimen -You have been prescribed Sennakot-S to take nightly to prevent constipation especially if you are taking the narcotic pain medication intermittently.  It is important to prevent constipation and drink adequate amounts of liquids.  Blood Transfusion Information WHAT IS A BLOOD TRANSFUSION? A transfusion is the  replacement of blood or some of its parts. Blood is made up of multiple cells which provide different functions.  Red blood cells carry oxygen and are used for blood loss replacement.  White blood cells fight against infection.  Platelets control bleeding.  Plasma helps clot blood.  Other blood products are available for specialized needs, such as hemophilia or other clotting disorders. BEFORE THE TRANSFUSION  Who gives blood for transfusions?   You may be able to donate blood to be used at a later date on yourself (autologous donation).  Relatives can be asked to donate blood. This is generally not any safer than if you have received blood from a stranger. The same precautions are taken to ensure safety when a relative's blood is donated.  Healthy volunteers who are fully evaluated to make sure their blood is safe. This is blood bank blood. Transfusion therapy is the safest it has ever been in the practice of medicine. Before blood is taken from a donor, a complete history is taken to make sure that person has no history of diseases nor engages in risky social behavior (examples are intravenous drug use or sexual activity with multiple partners). The donor's travel history is screened to minimize risk of transmitting infections, such as malaria. The donated blood is tested for signs of infectious diseases, such as HIV and hepatitis. The blood is then tested to be sure it is compatible with you in order to minimize the chance of a transfusion reaction. If you or a relative donates blood, this is often done in anticipation of surgery and is not appropriate for emergency situations. It takes many days to process the donated blood. RISKS AND COMPLICATIONS Although transfusion therapy is very safe and saves many lives, the main dangers of transfusion include:   Getting an infectious disease.  Developing a transfusion reaction. This is an allergic reaction to something in the blood you were  given. Every precaution is taken to prevent this. The decision to have a blood transfusion has been considered carefully by your caregiver before blood is given. Blood is not given unless the benefits outweigh the risks.   Surgical [facility] Care Surgical drains are used to remove extra fluid that normally builds up in a surgical wound after surgery. A surgical drain helps to heal a surgical wound. Different kinds of surgical drains include:  Active drains. These drains use suction to pull drainage away from the surgical wound. Drainage flows through a tube to a container outside of the body. With these drains, you need to keep the bulb or the drainage container flat (compressed) at all times, except while you empty it. Flattening the bulb or container creates suction.  Passive drains. These drains allow fluid to drain naturally, by gravity. Drainage flows through a tube to a bandage (dressing) or a container outside of the body. Passive drains do not need to be emptied. A drain is placed during surgery. Right after surgery, drainage is usually bright red and a little thicker than water. The drainage  may gradually turn yellow or pink and become thinner. It is likely that your health care provider will remove the drain when the drainage stops or when the amount decreases to 1-2 Tbsp (15-30 mL) during a 24-hour period. Supplies needed:  Tape.  Germ-free cleaning solution (sterile saline).  Cotton swabs.  Split gauze drain sponge: 4 x 4 inches (10 x 10 cm).  Gauze square: 4 x 4 inches (10 x 10 cm). How to care for your surgical drain Care for your drain as told by your health care provider. This is important to help prevent infection. If your drain is placed at your back, or any other hard-to-reach area, ask another person to assist you in performing the following tasks: General care  Keep the skin around the drain dry and covered with a dressing at all times.  Check your drain area every  day for signs of infection. Check for: ? Redness, swelling, or pain. ? Pus or a bad smell. ? Cloudy drainage. ? Tenderness or pressure at the drain exit site. Changing the dressing Follow instructions from your health care provider about how to change your dressing. Change your dressing at least once a day. Change it more often if needed to keep the dressing dry. Make sure you: 1. Gather your supplies. 2. Wash your hands with soap and water before you change your dressing. If soap and water are not available, use hand sanitizer. 3. Remove the old dressing. Avoid using scissors to do that. 4. Wash your hands with soap and water again after removing the old dressing. 5. Use sterile saline to clean your skin around the drain. You may need to use a cotton swab to clean the skin. 6. Place the tube through the slit in a drain sponge. Place the drain sponge so that it covers your wound. 7. Place the gauze square or another drain sponge on top of the drain sponge that is on the wound. Make sure the tube is between those layers. 8. Tape the dressing to your skin. 9. Tape the drainage tube to your skin 1-2 inches (2.5-5 cm) below the place where the tube enters your body. Taping keeps the tube from pulling on any stitches (sutures) that you have. 10. Wash your hands with soap and water. 11. Write down the color of your drainage and how often you change your dressing. How to empty your active drain  1. Make sure that you have a measuring cup that you can empty your drainage into. 2. Wash your hands with soap and water. If soap and water are not available, use hand sanitizer. 3. Loosen any pins or clips that hold the tube in place. 4. If your health care provider tells you to strip the tube to prevent clots and tube blockages: ? Hold the tube at the skin with one hand. Use your other hand to pinch the tubing with your thumb and first finger. ? Gently move your fingers down the tube while squeezing very  lightly. This clears any drainage, clots, or tissue from the tube. ? You may need to do this several times each day to keep the tube clear. Do not pull on the tube. 5. Open the bulb cap or the drain plug. Do not touch the inside of the cap or the bottom of the plug. 6. Turn the device upside down and gently squeeze. 7. Empty all of the drainage into the measuring cup. 8. Compress the bulb or the container and replace the cap or  the plug. To compress the bulb or the container, squeeze it firmly in the middle while you close the cap or plug the container. 9. Write down the amount of drainage that you have in each 24-hour period. If you have less than 2 Tbsp (30 mL) of drainage during 24 hours, contact your health care provider. 10. Flush the drainage down the toilet. 11. Wash your hands with soap and water. Contact a health care provider if:  You have redness, swelling, or pain around your drain area.  You have pus or a bad smell coming from your drain area.  You have a fever or chills.  The skin around your drain is warm to the touch.  The amount of drainage that you have is increasing instead of decreasing.  You have drainage that is cloudy.  There is a sudden stop or a sudden decrease in the amount of drainage that you have.  Your drain tube falls out.  Your active drain does not stay compressed after you empty it. Summary  Surgical drains are used to remove extra fluid that normally builds up in a surgical wound after surgery.  Different kinds of surgical drains include active drains and passive drains. Active drains use suction to pull drainage away from the surgical wound, and passive drains allow fluid to drain naturally.  It is important to care for your drain to prevent infection. If your drain is placed at your back, or any other hard-to-reach area, ask another person to assist you.  Contact your health care provider if you have redness, swelling, or pain around your drain  area. This information is not intended to replace advice given to you by your health care provider. Make sure you discuss any questions you have with your health care provider. Document Released: 02/21/2000 Document Revised: 03/30/2018 Document Reviewed: 03/30/2018 Elsevier Patient Education  2020 [employer].

## 2018-09-12 NOTE — H&P (View-Only) (Signed)
Consult Note: Gyn-Onc  Consult was requested by Dr. Modesta Messing for the evaluation of [name] 78 y.o. female  CC:  Chief Complaint  Patient presents with  . Vulvar intraepithelial neoplasia III (VIN III)    Assessment/Plan:  [name]  is a 78 y.o.  year old with apparent stage IB vulvar carcinoma.  I suspect this based on physical exam despite the biopsy before showing VIN 3.  I suspect this was under sampling error.  We will follow-up the result of today's biopsy.  If this confirms invasive carcinoma of the vulvar I am recommending an anterior radical vulvectomy with bilateral inguinal lymphadectomy.  Before surgery I recommend a PET to rule out distant metastases.  I explained to the patient and her daughter who was attending via phone my suspicions and the plan of treatment.  I discussed the risks of surgery particular wound separation infection, lymphocyst, lymphedema, infection in the groins.  I discussed wound management and drainage management I explained that the drains may need to be in place for a month or so.  The patient and her daughter seem to be in agreement with this plan.  She is on the schedule for later in July for surgical procedure.   HPI: [name] is a 78 year old P3 who was seen in consultation at the request of Dr. 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  The patient reported experiencing a palpable lump in the winter 2019 and 2020.  This is on the anterior portion of her vulva.  She put off seeing physician for this because she reported that she was busy.  It became more noticeable in April 2020 and she attempted to have an appointment with a gynecologist however she could not get an appointment due to the the coronavirus shot downs.  She was eventually able to see a physician on August 25, 2018 at which time evaluation of the vulva confirmed a vulvar mass.  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  The patient's  medical history is significant for a remote history of cervical cancer diagnosed in 2008.  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 Marti Sleigh.  She has had no other prior abdominal surgeries.  She is an ex-smoker and quit in October 2004.  She is a nondrinker.  She denies urinary incontinence.  Her most significant medical comorbidities right now is trigeminal neuralgia.  She has had gamma knife radiation to treat this.  She is retired.  She lives alone.  Symptoms from this mass include discomfort, and occasional pruritus, and an occasional bloody discharge.  Current Meds:  Outpatient Encounter Medications as of 09/12/2018  Medication Sig  . aspirin 81 MG chewable tablet Chew 81 mg by mouth daily.  . Cholecalciferol (VITAMIN D3) 50 MCG (2000 UT) capsule Take 2,000 Units by mouth daily.   . fish oil-omega-3 fatty acids 1000 MG capsule Take 4 capsules by mouth daily. Dose 1200mg / 360mg  Omega 3   . levothyroxine (SYNTHROID) 75 MCG tablet Take 75 mcg by mouth daily before breakfast.  . Rosuvastatin Calcium (CRESTOR PO) Take 5 mg by mouth daily.   Marland Kitchen ibuprofen (ADVIL) 600 MG tablet Take 1 tablet (600 mg total) by mouth every 6 (six) hours as needed. For AFTER surgery  . Oxcarbazepine (TRILEPTAL) 300 MG tablet Take 150 mg by mouth daily as needed.   . senna-docusate (SENOKOT-S) 8.6-50 MG tablet Take 2 tablets by mouth at bedtime. For AFTER  surgery, do not take if having diarrhea  . traMADol (ULTRAM) 50 MG tablet Take 1 tablet (50 mg total) by mouth every 6 (six) hours as needed. For AFTER surgery, do not take and drive  . VENTOLIN HFA 108 (90 Base) MCG/ACT inhaler INHALE 2 PUFFS BY MOUTH EVERY 4 TO 6 HOURS AS NEEDED FOR SHORTNESS OF BREATH/WHEEZING  . [DISCONTINUED] cholecalciferol (VITAMIN D) 1000 UNITS tablet Take 1,000 Units by mouth daily.    . [DISCONTINUED] HYDROCHLOROTHIAZIDE PO Take 12.5 mg by mouth daily.   . [DISCONTINUED]  Levothyroxine Sodium (SYNTHROID PO) Take 75 mcg by mouth daily. One tablet daily for two days then 1/2 tablet  . [DISCONTINUED] OXcarbazepine (TRILEPTAL) 150 MG tablet Take 150 mg by mouth 2 (two) times daily.     No facility-administered encounter medications on file as of 09/12/2018.     Allergy:  Allergies  Allergen Reactions  . Prednisone Other (See Comments)    Feels Dizzy  . Codeine Anxiety    "Jumpy inside"    Social Hx:   Social History   Socioeconomic History  . Marital status: Widowed    Spouse name: Not on file  . Number of children: Not on file  . Years of education: Not on file  . Highest education level: Not on file  Occupational History  . Not on file  Social Needs  . Financial resource strain: Not on file  . Food insecurity    Worry: Not on file    Inability: Not on file  . Transportation needs    Medical: Not on file    Non-medical: Not on file  Tobacco Use  . Smoking status: Former Smoker    Quit date: 12/08/2003    Years since quitting: 14.7  . Smokeless tobacco: Never Used  Substance and Sexual Activity  . Alcohol use: No  . Drug use: No  . Sexual activity: Never  Lifestyle  . Physical activity    Days per week: Not on file    Minutes per session: Not on file  . Stress: Not on file  Relationships  . Social Herbalist on phone: Not on file    Gets together: Not on file    Attends religious service: Not on file    Active member of club or organization: Not on file    Attends meetings of clubs or organizations: Not on file    Relationship status: Not on file  . Intimate partner violence    Fear of current or ex partner: Not on file    Emotionally abused: Not on file    Physically abused: Not on file    Forced sexual activity: Not on file  Other Topics Concern  . Not on file  Social History Narrative  . Not on file    Past Surgical Hx:  Past Surgical History:  Procedure Laterality Date  . ABDOMINAL HYSTERECTOMY     Pelvic  lymphadenectomy  . SUPRAPUBIC CATHETER PLACEMENT      Past Medical Hx:  Past Medical History:  Diagnosis Date  . Hyperlipidemia   . Hypertension   . Hypothyroidism   . 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  No LMP recorded. Patient is postmenopausal.  Family Hx:  Family History  Problem Relation Age of Onset  . Skin cancer  Father     Review of Systems:  Constitutional  Feels well,   ENT Normal appearing ears and nares bilaterally Skin/Breast  No rash, sores, jaundice, itching, dryness Cardiovascular  No chest pain, shortness of breath, or edema  Pulmonary  No cough or wheeze.  Gastro Intestinal  No nausea, vomitting, or diarrhoea. No bright red blood per rectum, no abdominal pain, change in bowel movement, or constipation.  Genito Urinary  No frequency, urgency, dysuria, + vulvar pruritis and vulvodynia Musculo Skeletal  No myalgia, arthralgia, joint swelling or pain  Neurologic  No weakness, numbness, change in gait,  Psychology  No depression, anxiety, insomnia.   Vitals:  Blood pressure 124/80, pulse 86, temperature 98.2 F (36.8 C), temperature source Oral, resp. rate 20, height 5\' 5"  (1.651 m), weight 158 lb 9.6 oz (71.9 kg), SpO2 97 %.  Physical Exam: WD in NAD Neck  Supple NROM, without any enlargements.  Lymph Node Survey No cervical supraclavicular or inguinal adenopathy Cardiovascular  Pulse normal rate, regularity and rhythm. S1 and S2 normal.  Lungs  Clear to auscultation bilateraly, without wheezes/crackles/rhonchi. Good air movement.  Skin  No rash/lesions/breakdown  Psychiatry  Alert and oriented to person, place, and time  Abdomen  Normoactive bowel sounds, abdomen soft, non-tender and obese without evidence of hernia. Back No CVA tenderness Genito Urinary  Vulva/vagina: A 6 cm exophytic  masses arising from the anterior vulva.  It is mobile and not fixed to underlying structures.  Is at least 2 cm from the urethral meatus, and possibly close to 5 cm however this part of the examination was poorly tolerated due to the patient discomfort.  It grossly appears consistent with malignancy.  Bladder/urethra:  No lesions or masses, well supported bladder  Vagina: no lesions present   Cervix and uterus surgically absent  Adnexa: no palpable masses. Rectal  deferred.  Extremities  No bilateral cyanosis, clubbing or edema.   Procedure Note:  Preop Dx: vulvar mass Postop Dx: same Procedure: vulvar biopsy Surgeon: Dorann Ou, MD EBL: 5cc Specimens: vulvar biopsy Complications: none Procedure Details: The procedure was explained to the patient and she offered verbal consent.  Verbal timeout was performed.  The vulva mass was prepped with Betadine.  It was infiltrated with 1 cc of 2% lidocaine.  A 2 mm punch biopsy was used to take multiple punches from representative areas of the vulva mass.  These were placed in the same specimen cup.  Hemostasis was achieved at the biopsy sites with silver nitrate.  The specimen sent for histopathology.  The patient tolerated the procedure well.  Thereasa Solo, MD  09/12/2018, 2:27 PM

## 2018-09-13 DIAGNOSIS — D071 Carcinoma in situ of vulva: Secondary | ICD-10-CM | POA: Diagnosis not present

## 2018-09-16 ENCOUNTER — Ambulatory Visit (HOSPITAL_COMMUNITY): Payer: Medicare Other

## 2018-09-22 ENCOUNTER — Telehealth: Payer: Self-pay | Admitting: Oncology

## 2018-09-22 ENCOUNTER — Other Ambulatory Visit (HOSPITAL_COMMUNITY): Payer: Self-pay | Admitting: *Deleted

## 2018-09-22 NOTE — Telephone Encounter (Signed)
Called patient's daughter, Sharyon Medicus and discussed that Oline's surgery will be for a radical vulvectomy only and that once the pathology is reviewed, they may recommend a bilateral inguinal lymphadectomy at a later time.  Also advised her of canceled appointment with Joylene John, NP.  She verbalized agreement and understanding.

## 2018-09-22 NOTE — Patient Instructions (Addendum)
YOU NEED TO HAVE A COVID 19 TEST ON 09-23-2018 AT 1005 AM. THIS TEST MUST BE DONE BEFORE SURGERY, COME TO [facility] ENTRANCE. ONCE YOUR COVID TEST IS COMPLETED, PLEASE BEGIN THE QUARANTINE INSTRUCTIONS AS OUTLINED IN YOUR HANDOUT.                Jeanene Mena    Your procedure is scheduled on: 09-27-2018   Report to [facility] Main  Entrance               Report to admitting at      800 AM      Call this number if you have problems the morning of surgery [phone]    Remember: Do not eat food or drink liquids :After Midnight. BRUSH YOUR TEETH MORNING OF SURGERY AND RINSE YOUR MOUTH OUT, NO CHEWING GUM CANDY OR MINTS.     Take these medicines the morning of surgery with A SIP OF WATER: crestor, levothyroxine                                You may not have any metal on your body including hair pins and              piercings  Do not wear jewelry, make-up, lotions, powders or perfumes, deodorant             Do not wear nail polish.  Do not shave  48 hours prior to surgery.               Do not bring valuables to the hospital. [place].  Contacts, dentures or bridgework may not be worn into surgery.    _____________________________________________________________________          [facility] - Preparing for Surgery Before surgery, you can play an important role.  Because skin is not sterile, your skin needs to be as free of germs as possible.  You can reduce the number of germs on your skin by washing with CHG (chlorahexidine gluconate) soap before surgery.  CHG is an antiseptic cleaner which kills germs and bonds with the skin to continue killing germs even after washing. Please DO NOT use if you have an allergy to CHG or antibacterial soaps.  If your skin becomes reddened/irritated stop using the CHG and inform your nurse when you arrive at Short Stay. Do not shave (including legs and underarms)  for at least 48 hours prior to the first CHG shower.  You may shave your face/neck. Please follow these instructions carefully:  1.  Shower with CHG Soap the night before surgery and the  morning of Surgery.  2.  If you choose to wash your hair, wash your hair first as usual with your  normal  shampoo.  3.  After you shampoo, rinse your hair and body thoroughly to remove the  shampoo.                           4.  Use CHG as you would any other liquid soap.  You can apply chg directly  to the skin and wash                       Gently with a scrungie or  clean washcloth.  5.  Apply the CHG Soap to your body ONLY FROM THE NECK DOWN.   Do not use on face/ open                           Wound or open sores. Avoid contact with eyes, ears mouth and genitals (private parts).                       Wash face,  Genitals (private parts) with your normal soap.             6.  Wash thoroughly, paying special attention to the area where your surgery  will be performed.  7.  Thoroughly rinse your body with warm water from the neck down.  8.  DO NOT shower/wash with your normal soap after using and rinsing off  the CHG Soap.                9.  Pat yourself dry with a clean towel.            10.  Wear clean pajamas.            11.  Place clean sheets on your bed the night of your first shower and do not  sleep with pets. Day of Surgery : Do not apply any lotions/deodorants the morning of surgery.  Please wear clean clothes to the hospital/surgery center.  FAILURE TO FOLLOW THESE INSTRUCTIONS MAY RESULT IN THE CANCELLATION OF YOUR SURGERY PATIENT SIGNATURE_________________________________  NURSE SIGNATURE__________________________________  ________________________________________________________________________   [name]  An incentive spirometer is a tool that can help keep your lungs clear and active. This tool measures how well you are filling your lungs with each breath. Taking long deep  breaths may help reverse or decrease the chance of developing breathing (pulmonary) problems (especially infection) following:  A long period of time when you are unable to move or be active. BEFORE THE PROCEDURE   If the spirometer includes an indicator to show your best effort, your nurse or respiratory therapist will set it to a desired goal.  If possible, sit up straight or lean slightly forward. Try not to slouch.  Hold the incentive spirometer in an upright position. INSTRUCTIONS FOR USE  1. Sit on the edge of your bed if possible, or sit up as far as you can in bed or on a chair. 2. Hold the incentive spirometer in an upright position. 3. Breathe out normally. 4. Place the mouthpiece in your mouth and seal your lips tightly around it. 5. Breathe in slowly and as deeply as possible, raising the piston or the ball toward the top of the column. 6. Hold your breath for 3-5 seconds or for as long as possible. Allow the piston or ball to fall to the bottom of the column. 7. Remove the mouthpiece from your mouth and breathe out normally. 8. Rest for a few seconds and repeat Steps 1 through 7 at least 10 times every 1-2 hours when you are awake. Take your time and take a few normal breaths between deep breaths. 9. The spirometer may include an indicator to show your best effort. Use the indicator as a goal to work toward during each repetition. 10. After each set of 10 deep breaths, practice coughing to be sure your lungs are clear. If you have an incision (the cut made at the time of surgery), support your incision when coughing  by placing a pillow or rolled up towels firmly against it. Once you are able to get out of bed, walk around indoors and cough well. You may stop using the incentive spirometer when instructed by your caregiver.  RISKS AND COMPLICATIONS  Take your time so you do not get dizzy or light-headed.  If you are in pain, you may need to take or ask for pain medication before  doing incentive spirometry. It is harder to take a deep breath if you are having pain. AFTER USE  Rest and breathe slowly and easily.  It can be helpful to keep track of a log of your progress. Your caregiver can provide you with a simple table to help with this. If you are using the spirometer at home, follow these instructions: [place] IF:   You are having difficultly using the spirometer.  You have trouble using the spirometer as often as instructed.  Your pain medication is not giving enough relief while using the spirometer.  You develop fever of 100.5 F (38.1 C) or higher. SEEK IMMEDIATE MEDICAL CARE IF:   You cough up bloody sputum that had not been present before.  You develop fever of 102 F (38.9 C) or greater.  You develop worsening pain at or near the incision site. MAKE SURE YOU:   Understand these instructions.  Will watch your condition.  Will get help right away if you are not doing well or get worse. Document Released: 07/06/2006 Document Revised: 05/18/2011 Document Reviewed: 09/06/2006 ExitCare Patient Information 2014 ExitCare, [place].   ________________________________________________________________________  WHAT IS A BLOOD TRANSFUSION? Blood Transfusion Information  A transfusion is the replacement of blood or some of its parts. Blood is made up of multiple cells which provide different functions.  Red blood cells carry oxygen and are used for blood loss replacement.  White blood cells fight against infection.  Platelets control bleeding.  Plasma helps clot blood.  Other blood products are available for specialized needs, such as hemophilia or other clotting disorders. BEFORE THE TRANSFUSION  Who gives blood for transfusions?   Healthy volunteers who are fully evaluated to make sure their blood is safe. This is blood bank blood. Transfusion therapy is the safest it has ever been in the practice of medicine. Before blood is taken  from a donor, a complete history is taken to make sure that person has no history of diseases nor engages in risky social behavior (examples are intravenous drug use or sexual activity with multiple partners). The donor's travel history is screened to minimize risk of transmitting infections, such as malaria. The donated blood is tested for signs of infectious diseases, such as HIV and hepatitis. The blood is then tested to be sure it is compatible with you in order to minimize the chance of a transfusion reaction. If you or a relative donates blood, this is often done in anticipation of surgery and is not appropriate for emergency situations. It takes many days to process the donated blood. RISKS AND COMPLICATIONS Although transfusion therapy is very safe and saves many lives, the main dangers of transfusion include:   Getting an infectious disease.  Developing a transfusion reaction. This is an allergic reaction to something in the blood you were given. Every precaution is taken to prevent this. The decision to have a blood transfusion has been considered carefully by your caregiver before blood is given. Blood is not given unless the benefits outweigh the risks. AFTER THE TRANSFUSION  Right after receiving a  blood transfusion, you will usually feel much better and more energetic. This is especially true if your red blood cells have gotten low (anemic). The transfusion raises the level of the red blood cells which carry oxygen, and this usually causes an energy increase.  The nurse administering the transfusion will monitor you carefully for complications. HOME CARE INSTRUCTIONS  No special instructions are needed after a transfusion. You may find your energy is better. Speak with your caregiver about any limitations on activity for underlying diseases you may have. SEEK MEDICAL CARE IF:   Your condition is not improving after your transfusion.  You develop redness or irritation at the  intravenous (IV) site. SEEK IMMEDIATE MEDICAL CARE IF:  Any of the following symptoms occur over the next 12 hours:  Shaking chills.  You have a temperature by mouth above 102 F (38.9 C), not controlled by medicine.  Chest, back, or muscle pain.  People around you feel you are not acting correctly or are confused.  Shortness of breath or difficulty breathing.  Dizziness and fainting.  You get a rash or develop hives.  You have a decrease in urine output.  Your urine turns a dark color or changes to pink, red, or brown. Any of the following symptoms occur over the next 10 days:  You have a temperature by mouth above 102 F (38.9 C), not controlled by medicine.  Shortness of breath.  Weakness after normal activity.  The white part of the eye turns yellow (jaundice).  You have a decrease in the amount of urine or are urinating less often.  Your urine turns a dark color or changes to pink, red, or brown. Document Released: 02/21/2000 Document Revised: 05/18/2011 Document Reviewed: 10/10/2007 [employer] Patient Information 2014 [place], [place].  _______________________________________________________________________

## 2018-09-22 NOTE — Progress Notes (Signed)
VASCULAR LOV 05-04-18 EPIC

## 2018-09-23 ENCOUNTER — Encounter (HOSPITAL_COMMUNITY)
Admission: RE | Admit: 2018-09-23 | Discharge: 2018-09-23 | Disposition: A | Discharge status: Home / Self Care | Payer: Medicare Other | Source: Ambulatory Visit | Attending: Gynecologic Oncology | Admitting: Gynecologic Oncology

## 2018-09-23 ENCOUNTER — Other Ambulatory Visit: Payer: Self-pay

## 2018-09-23 ENCOUNTER — Encounter (HOSPITAL_COMMUNITY): Payer: Self-pay

## 2018-09-23 ENCOUNTER — Other Ambulatory Visit (HOSPITAL_COMMUNITY)
Admission: RE | Admit: 2018-09-23 | Discharge: 2018-09-23 | Disposition: A | Discharge status: Home / Self Care | Payer: Medicare Other | Source: Ambulatory Visit | Attending: Gynecologic Oncology | Admitting: Gynecologic Oncology

## 2018-09-23 DIAGNOSIS — C519 Malignant neoplasm of vulva, unspecified: Secondary | ICD-10-CM | POA: Insufficient documentation

## 2018-09-23 DIAGNOSIS — I10 Essential (primary) hypertension: Secondary | ICD-10-CM | POA: Diagnosis not present

## 2018-09-23 DIAGNOSIS — Z1159 Encounter for screening for other viral diseases: Secondary | ICD-10-CM | POA: Diagnosis not present

## 2018-09-23 DIAGNOSIS — Z01818 Encounter for other preprocedural examination: Secondary | ICD-10-CM | POA: Insufficient documentation

## 2018-09-23 DIAGNOSIS — Z87891 Personal history of nicotine dependence: Secondary | ICD-10-CM | POA: Diagnosis not present

## 2018-09-23 HISTORY — DX: Other complications of anesthesia, initial encounter: T88.59XA

## 2018-09-23 LAB — URINALYSIS, ROUTINE W REFLEX MICROSCOPIC
Bilirubin Urine: NEGATIVE
Glucose, UA: NEGATIVE mg/dL
Hgb urine dipstick: NEGATIVE
Ketones, ur: NEGATIVE mg/dL
Nitrite: NEGATIVE
Protein, ur: 30 mg/dL — AB
Specific Gravity, Urine: 1.026 (ref 1.005–1.030)
pH: 5 (ref 5.0–8.0)

## 2018-09-23 LAB — COMPREHENSIVE METABOLIC PANEL
ALT: 13 U/L (ref 0–44)
AST: 21 U/L (ref 15–41)
Albumin: 4 g/dL (ref 3.5–5.0)
Alkaline Phosphatase: 82 U/L (ref 38–126)
Anion gap: 10 (ref 5–15)
BUN: 14 mg/dL (ref 8–23)
CO2: 27 mmol/L (ref 22–32)
Calcium: 10.6 mg/dL — ABNORMAL HIGH (ref 8.9–10.3)
Chloride: 104 mmol/L (ref 98–111)
Creatinine, Ser: 0.89 mg/dL (ref 0.44–1.00)
GFR calc Af Amer: >60 mL/min (ref >60)
GFR calc non Af Amer: >60 mL/min (ref >60)
Glucose, Bld: 116 mg/dL — ABNORMAL HIGH (ref 70–99)
Potassium: 4.2 mmol/L (ref 3.5–5.1)
Sodium: 141 mmol/L (ref 135–145)
Total Bilirubin: 0.6 mg/dL (ref 0.3–1.2)
Total Protein: 7.7 g/dL (ref 6.5–8.1)

## 2018-09-23 LAB — CBC
HCT: 46.8 % — ABNORMAL HIGH (ref 36.0–46.0)
Hemoglobin: 14.7 g/dL (ref 12.0–15.0)
MCH: 28.8 pg (ref 26.0–34.0)
MCHC: 31.4 g/dL (ref 30.0–36.0)
MCV: 91.8 fL (ref 80.0–100.0)
Platelets: 177 10*3/uL (ref 150–400)
RBC: 5.1 MIL/uL (ref 3.87–5.11)
RDW: 13.7 % (ref 11.5–15.5)
WBC: 8.4 10*3/uL (ref 4.0–10.5)
nRBC: 0 % (ref 0.0–0.2)

## 2018-09-23 LAB — SARS CORONAVIRUS 2 (TAT 6-24 HRS): SARS Coronavirus 2: NEGATIVE

## 2018-09-25 LAB — URINE CULTURE: Culture: >=100000 — AB

## 2018-09-26 ENCOUNTER — Other Ambulatory Visit: Payer: Self-pay | Admitting: Gynecologic Oncology

## 2018-09-26 ENCOUNTER — Telehealth: Payer: Self-pay | Admitting: Oncology

## 2018-09-26 DIAGNOSIS — N3000 Acute cystitis without hematuria: Secondary | ICD-10-CM

## 2018-09-26 MED ORDER — SULFAMETHOXAZOLE-TRIMETHOPRIM 800-160 MG PO TABS: 1.0000 | ORAL_TABLET | Freq: Two times a day (BID) | ORAL | 0 refills | Status: DC

## 2018-09-26 NOTE — Telephone Encounter (Signed)
Called Buffie and advised her that Jaquay has a UTI and that Joylene John, NP will be sending in a prescription for Bactrim BID for 3 days. Buffie requested that it be sent to [facility] in [place].  She also asked if the surgery tomorrow will be inpatient or outpatient.  Advised her that it will be outpatient.

## 2018-09-26 NOTE — Progress Notes (Signed)
See note from Elmo Putt, RN. Creatinine clearance greater than 60. Bactrim prescribed for UTI preop

## 2018-09-27 ENCOUNTER — Inpatient Hospital Stay (HOSPITAL_COMMUNITY): Payer: Medicare Other | Admitting: Certified Registered Nurse Anesthetist

## 2018-09-27 ENCOUNTER — Encounter (HOSPITAL_COMMUNITY): Payer: Self-pay

## 2018-09-27 ENCOUNTER — Inpatient Hospital Stay (HOSPITAL_COMMUNITY): Payer: Medicare Other | Admitting: Physician Assistant

## 2018-09-27 ENCOUNTER — Ambulatory Visit (HOSPITAL_COMMUNITY)
Admission: RE | Admit: 2018-09-27 | Discharge: 2018-09-27 | Disposition: A | Discharge status: Home / Self Care | Payer: Medicare Other | Attending: Gynecologic Oncology | Admitting: Gynecologic Oncology

## 2018-09-27 ENCOUNTER — Encounter (HOSPITAL_COMMUNITY)
Admission: RE | Disposition: A | Discharge status: Home / Self Care | Payer: Self-pay | Source: Home / Self Care | Attending: Gynecologic Oncology

## 2018-09-27 DIAGNOSIS — Z87891 Personal history of nicotine dependence: Secondary | ICD-10-CM | POA: Insufficient documentation

## 2018-09-27 DIAGNOSIS — Z79899 Other long term (current) drug therapy: Secondary | ICD-10-CM | POA: Insufficient documentation

## 2018-09-27 DIAGNOSIS — G500 Trigeminal neuralgia: Secondary | ICD-10-CM | POA: Diagnosis not present

## 2018-09-27 DIAGNOSIS — Z7982 Long term (current) use of aspirin: Secondary | ICD-10-CM | POA: Diagnosis not present

## 2018-09-27 DIAGNOSIS — Z8541 Personal history of malignant neoplasm of cervix uteri: Secondary | ICD-10-CM | POA: Insufficient documentation

## 2018-09-27 DIAGNOSIS — I10 Essential (primary) hypertension: Secondary | ICD-10-CM | POA: Insufficient documentation

## 2018-09-27 DIAGNOSIS — E785 Hyperlipidemia, unspecified: Secondary | ICD-10-CM | POA: Diagnosis not present

## 2018-09-27 DIAGNOSIS — Z90710 Acquired absence of both cervix and uterus: Secondary | ICD-10-CM | POA: Diagnosis not present

## 2018-09-27 DIAGNOSIS — Z79890 Hormone replacement therapy (postmenopausal): Secondary | ICD-10-CM | POA: Insufficient documentation

## 2018-09-27 DIAGNOSIS — Z888 Allergy status to other drugs, medicaments and biological substances status: Secondary | ICD-10-CM | POA: Diagnosis not present

## 2018-09-27 DIAGNOSIS — Z885 Allergy status to narcotic agent status: Secondary | ICD-10-CM | POA: Insufficient documentation

## 2018-09-27 DIAGNOSIS — C519 Malignant neoplasm of vulva, unspecified: Secondary | ICD-10-CM | POA: Diagnosis not present

## 2018-09-27 DIAGNOSIS — N9089 Other specified noninflammatory disorders of vulva and perineum: Secondary | ICD-10-CM | POA: Diagnosis present

## 2018-09-27 DIAGNOSIS — E039 Hypothyroidism, unspecified: Secondary | ICD-10-CM | POA: Diagnosis not present

## 2018-09-27 HISTORY — PX: RADICAL VULVECTOMY: SHX6584

## 2018-09-27 LAB — TYPE AND SCREEN
ABO/RH(D): O POS
Antibody Screen: NEGATIVE

## 2018-09-27 SURGERY — VULVECTOMY, RADICAL
Anesthesia: General

## 2018-09-27 MED ORDER — HYDROMORPHONE HCL 1 MG/ML IJ SOLN: 0.2500 mg | INTRAMUSCULAR | Status: DC | PRN

## 2018-09-27 MED ORDER — ACETAMINOPHEN 325 MG PO TABS: 650.0000 mg | ORAL_TABLET | ORAL | Status: DC | PRN

## 2018-09-27 MED ORDER — TRAMADOL HCL 50 MG PO TABS: 50.0000 mg | ORAL_TABLET | Freq: Four times a day (QID) | ORAL | Status: DC | PRN

## 2018-09-27 MED ORDER — DEXAMETHASONE SODIUM PHOSPHATE 10 MG/ML IJ SOLN
INTRAMUSCULAR | Status: DC | PRN
  Administered 2018-09-27: 10 mg via INTRAVENOUS

## 2018-09-27 MED ORDER — ENOXAPARIN SODIUM 40 MG/0.4ML ~~LOC~~ SOLN
40.0000 mg | SUBCUTANEOUS | Status: AC
  Administered 2018-09-27: 40 mg via SUBCUTANEOUS
  Filled 2018-09-27: qty 0.4

## 2018-09-27 MED ORDER — SODIUM CHLORIDE 0.9% FLUSH: 3.0000 mL | Freq: Two times a day (BID) | INTRAVENOUS | Status: DC

## 2018-09-27 MED ORDER — SODIUM CHLORIDE 0.9 % IV SOLN: 250.0000 mL | INTRAVENOUS | Status: DC | PRN

## 2018-09-27 MED ORDER — SUGAMMADEX SODIUM 200 MG/2ML IV SOLN
INTRAVENOUS | Status: DC | PRN
  Administered 2018-09-27: 150 mg via INTRAVENOUS

## 2018-09-27 MED ORDER — FENTANYL CITRATE (PF) 100 MCG/2ML IJ SOLN
INTRAMUSCULAR | Status: DC | PRN
  Administered 2018-09-27 (×2): 50 ug via INTRAVENOUS

## 2018-09-27 MED ORDER — ONDANSETRON HCL 4 MG/2ML IJ SOLN
INTRAMUSCULAR | Status: AC
  Filled 2018-09-27: qty 2

## 2018-09-27 MED ORDER — ACETAMINOPHEN 650 MG RE SUPP
650.0000 mg | RECTAL | Status: DC | PRN
  Filled 2018-09-27: qty 1

## 2018-09-27 MED ORDER — FENTANYL CITRATE (PF) 250 MCG/5ML IJ SOLN
INTRAMUSCULAR | Status: AC
  Filled 2018-09-27: qty 5

## 2018-09-27 MED ORDER — DEXAMETHASONE SODIUM PHOSPHATE 10 MG/ML IJ SOLN
INTRAMUSCULAR | Status: AC
  Filled 2018-09-27: qty 1

## 2018-09-27 MED ORDER — PROMETHAZINE HCL 25 MG/ML IJ SOLN: 6.2500 mg | INTRAMUSCULAR | Status: DC | PRN

## 2018-09-27 MED ORDER — LIDOCAINE 2% (20 MG/ML) 5 ML SYRINGE
INTRAMUSCULAR | Status: DC | PRN
  Administered 2018-09-27: 60 mg via INTRAVENOUS

## 2018-09-27 MED ORDER — LIDOCAINE HCL (PF) 1 % IJ SOLN
INTRAMUSCULAR | Status: AC
  Filled 2018-09-27: qty 30

## 2018-09-27 MED ORDER — LIDOCAINE-EPINEPHRINE 1 %-1:100000 IJ SOLN
INTRAMUSCULAR | Status: AC
  Filled 2018-09-27: qty 1

## 2018-09-27 MED ORDER — PROPOFOL 10 MG/ML IV BOLUS
INTRAVENOUS | Status: DC | PRN
  Administered 2018-09-27: 100 mg via INTRAVENOUS

## 2018-09-27 MED ORDER — ROCURONIUM BROMIDE 10 MG/ML (PF) SYRINGE
PREFILLED_SYRINGE | INTRAVENOUS | Status: AC
  Filled 2018-09-27: qty 10

## 2018-09-27 MED ORDER — LIDOCAINE-EPINEPHRINE 1 %-1:100000 IJ SOLN: INTRAMUSCULAR | Status: DC | PRN

## 2018-09-27 MED ORDER — LIDOCAINE HCL 1 % IJ SOLN
INTRAMUSCULAR | Status: DC | PRN
  Administered 2018-09-27: 20 mL

## 2018-09-27 MED ORDER — LIDOCAINE 2% (20 MG/ML) 5 ML SYRINGE
INTRAMUSCULAR | Status: AC
  Filled 2018-09-27: qty 5

## 2018-09-27 MED ORDER — PROPOFOL 10 MG/ML IV BOLUS
INTRAVENOUS | Status: AC
  Filled 2018-09-27: qty 20

## 2018-09-27 MED ORDER — ONDANSETRON HCL 4 MG/2ML IJ SOLN
INTRAMUSCULAR | Status: DC | PRN
  Administered 2018-09-27: 4 mg via INTRAVENOUS

## 2018-09-27 MED ORDER — LACTATED RINGERS IV SOLN
INTRAVENOUS | Status: DC
  Administered 2018-09-27 (×2): via INTRAVENOUS

## 2018-09-27 MED ORDER — SUCCINYLCHOLINE CHLORIDE 200 MG/10ML IV SOSY
PREFILLED_SYRINGE | INTRAVENOUS | Status: AC
  Filled 2018-09-27: qty 10

## 2018-09-27 MED ORDER — SODIUM CHLORIDE 0.9% FLUSH: 3.0000 mL | INTRAVENOUS | Status: DC | PRN

## 2018-09-27 MED ORDER — 0.9 % SODIUM CHLORIDE (POUR BTL) OPTIME
TOPICAL | Status: DC | PRN
  Administered 2018-09-27: 1000 mL

## 2018-09-27 MED ORDER — ROCURONIUM BROMIDE 50 MG/5ML IV SOSY
PREFILLED_SYRINGE | INTRAVENOUS | Status: DC | PRN
  Administered 2018-09-27: 40 mg via INTRAVENOUS

## 2018-09-27 MED ORDER — CEFAZOLIN SODIUM-DEXTROSE 2-4 GM/100ML-% IV SOLN
2.0000 g | INTRAVENOUS | Status: AC
  Administered 2018-09-27: 2 g via INTRAVENOUS
  Filled 2018-09-27: qty 100

## 2018-09-27 MED ORDER — FENTANYL CITRATE (PF) 100 MCG/2ML IJ SOLN: 12.5000 ug | INTRAMUSCULAR | Status: DC | PRN

## 2018-09-27 MED ORDER — SUCCINYLCHOLINE CHLORIDE 200 MG/10ML IV SOSY
PREFILLED_SYRINGE | INTRAVENOUS | Status: DC | PRN
  Administered 2018-09-27: 100 mg via INTRAVENOUS

## 2018-09-27 SURGICAL SUPPLY — 41 items
BLADE SURG 15 STRL LF DISP TIS (BLADE) ×2 IMPLANT
BLADE SURG 15 STRL SS (BLADE) ×2
CLIP VESOCCLUDE MED LG 6/CT (CLIP) IMPLANT
COVER MAYO STAND STRL (DRAPES) ×2 IMPLANT
COVER SURGICAL LIGHT HANDLE (MISCELLANEOUS) ×2 IMPLANT
COVER WAND RF STERILE (DRAPES) IMPLANT
DRAIN CHANNEL RND F F (WOUND CARE) ×1 IMPLANT
DRAPE SHEET LG 3/4 BI-LAMINATE (DRAPES) ×4 IMPLANT
DRAPE SURG IRRIG POUCH 19X23 (DRAPES) ×2 IMPLANT
ELECT PENCIL ROCKER SW 15FT (MISCELLANEOUS) ×2 IMPLANT
EVACUATOR SILICONE 100CC (DRAIN) ×1 IMPLANT
GAUZE 4X4 16PLY RFD (DISPOSABLE) ×4 IMPLANT
GAUZE SPONGE 4X4 12PLY STRL (GAUZE/BANDAGES/DRESSINGS) ×3 IMPLANT
GLOVE BIO SURGEON STRL SZ 6.5 (GLOVE) ×8 IMPLANT
GLOVE BIO SURGEON STRL SZ7.5 (GLOVE) ×2 IMPLANT
GLOVE BIOGEL PI IND STRL 7.0 (GLOVE) ×1 IMPLANT
GLOVE BIOGEL PI INDICATOR 7.0 (GLOVE) ×4
GOWN STRL REUS W/ TWL LRG LVL3 (GOWN DISPOSABLE) IMPLANT
GOWN STRL REUS W/TWL LRG LVL3 (GOWN DISPOSABLE) ×8 IMPLANT
KIT BASIN OR (CUSTOM PROCEDURE TRAY) ×2 IMPLANT
KIT TURNOVER KIT A (KITS) IMPLANT
NEEDLE HYPO 22GX1.5 SAFETY (NEEDLE) ×1 IMPLANT
NS IRRIG 1000ML POUR BTL (IV SOLUTION) ×2 IMPLANT
PACK LITHOTOMY IV (CUSTOM PROCEDURE TRAY) ×2 IMPLANT
SHEARS HARMONIC 9CM CVD (BLADE) ×1 IMPLANT
SPONGE LAP 18X18 RF (DISPOSABLE) ×4 IMPLANT
SUT VIC AB 2-0 CT2 27 (SUTURE) ×2 IMPLANT
SUT VIC AB 2-0 SH 27 (SUTURE) ×4
SUT VIC AB 2-0 SH 27X BRD (SUTURE) IMPLANT
SUT VIC AB 3-0 PS2 18 (SUTURE)
SUT VIC AB 3-0 PS2 18XBRD (SUTURE) IMPLANT
SUT VIC AB 3-0 SH 27 (SUTURE) ×2
SUT VIC AB 3-0 SH 27X BRD (SUTURE) ×6 IMPLANT
SUT VICRYL 2 0 18  UND BR (SUTURE)
SUT VICRYL 2 0 18 UND BR (SUTURE) ×1 IMPLANT
SYR CONTROL 10ML LL (SYRINGE) ×1 IMPLANT
TOWEL OR 17X26 10 PK STRL BLUE (TOWEL DISPOSABLE) ×2 IMPLANT
TOWEL OR NON WOVEN STRL DISP B (DISPOSABLE) ×2 IMPLANT
TRAY FOLEY MTR SLVR 14FR STAT (SET/KITS/TRAYS/PACK) ×1 IMPLANT
WATER STERILE IRR 1000ML POUR (IV SOLUTION) ×2 IMPLANT
YANKAUER SUCT BULB TIP 10FT TU (MISCELLANEOUS) ×2 IMPLANT

## 2018-09-27 NOTE — Op Note (Signed)
PATIENT: [name] DATE OF BIRTH: 1940/03/16 ENCOUNTER DATE: 09/27/2018   Preop Diagnosis: Vulvar mass at least VIN3 cannot rule out vulvar carcinoma  Postoperative Diagnosis: same.   Surgery: Right modified radical vulvectomy  Surgeons:  Imagene Gurney A. Alycia Rossetti, MD; Lahoma Crocker, MD   Anesthesia: General   Estimated blood loss: 50 ml   IVF:800  ml   Urine output: 620 ml   Complications: None   Pathology: Right vulva with suture at 12:00  Operative findings: 4 x 3 cm exophytic vulvar mass on a 2 cm stalk arising at the junction of the labia majora labia minora on the patient's right side.  Superinfected lesion with exudate throughout.  Chronic changes throughout the vulva and perineal area consistent with chronic wetness.  Procedure: The patient was identified in the preoperative holding area. Informed consent was signed on the chart. Patient was seen history was reviewed and exam was performed.   The patient was then taken to the operating room and placed in the supine position with SCD hose on.  She was then intubated in usual fashion with general anesthesia.  She was placed in the dorsolithotomy position.  The findings as above were noted.  The perineum, vulva, medial thighs were cleansed with Betadine.  Foley catheter was inserted into the bladder under sterile conditions.  Patient was then draped in the usual fashion and timeout was performed to confirm the patient, the procedure, antibiotic status, need for equipment, staff and any concerns.  An area was demarcated with the pen with a 2 cm margin around the lesion.  10 mL's of 1% plain lidocaine was injected.  Using the Bovie on cut the area was incised.  The harmonic scalpel was then used to begin developing the plane.  With the harmonic scalpel we were able to develop discrete pedicles and then take the subcu for excision of the lesion to the level of the fascia.  The area was hemostatic.  The anterior vagina at 12:00 was marked  and handed off for permanent sectioning.  2-0 suture was used to close the defect.  This allowed the skin edges to be brought together with no tension.  SQ suture of 3-0 Vicryl was done.  The skin was closed with a running 3-0 Vicryl.  An additional 10 mL's of 1% lidocaine was injected for postoperative pain relief.  The patient tolerated the procedure well and the Foley catheter was removed. All instrument needle and Ray-Tec counts were correct x2. The patient tolerated the procedure well and was taken to the recovery room in stable condition. This is [name] dictating an operative note on patient [name].

## 2018-09-27 NOTE — Transfer of Care (Signed)
Immediate Anesthesia Transfer of Care Note  Patient: [name]  Procedure(s) Performed: RADICAL VULVECTOMY (N/A )  Patient Location: PACU  Anesthesia Type:General  Level of Consciousness: awake, alert  and oriented  Airway & Oxygen Therapy: Patient Spontanous Breathing and Patient connected to face mask oxygen  Post-op Assessment: Report given to RN and Post -op Vital signs reviewed and stable  Post vital signs: Reviewed and stable  Last Vitals:  Vitals Value Taken Time  BP 126/75 09/27/18 1122  Temp    Pulse 73 09/27/18 1123  Resp 16 09/27/18 1124  SpO2 100 % 09/27/18 1123  Vitals shown include unvalidated device data.  Last Pain:  Vitals:   09/27/18 0848  TempSrc:   PainSc: 0-No pain         Complications: No apparent anesthesia complications

## 2018-09-27 NOTE — Anesthesia Procedure Notes (Signed)
Procedure Name: Intubation Date/Time: 09/27/2018 10:21 AM Performed by: Maxwell Caul, CRNA Pre-anesthesia Checklist: Patient identified, Emergency Drugs available, Suction available and Patient being monitored Patient Re-evaluated:Patient Re-evaluated prior to induction Oxygen Delivery Method: Circle system utilized Preoxygenation: Pre-oxygenation with 100% oxygen Induction Type: IV induction Laryngoscope Size: Mac and 4 Grade View: Grade I Tube type: Oral Tube size: 7.5 mm Number of attempts: 1 Airway Equipment and Method: Stylet Placement Confirmation: ETT inserted through vocal cords under direct vision,  positive ETCO2 and breath sounds checked- equal and bilateral Secured at: 21 cm Tube secured with: Tape Dental Injury: Teeth and Oropharynx as per pre-operative assessment

## 2018-09-27 NOTE — Anesthesia Preprocedure Evaluation (Addendum)
Anesthesia Evaluation  Patient identified by MRN, date of birth, ID band Patient awake    Reviewed: Allergy & Precautions, NPO status , Patient's Chart, lab work & pertinent test results  Airway Mallampati: II  TM Distance: >3 FB Neck ROM: Full    Dental no notable dental hx.    Pulmonary neg pulmonary ROS, former smoker,    Pulmonary exam normal breath sounds clear to auscultation       Cardiovascular hypertension, Normal cardiovascular exam Rhythm:Regular Rate:Normal     Neuro/Psych negative neurological ROS  negative psych ROS   GI/Hepatic negative GI ROS, Neg liver ROS,   Endo/Other  Hypothyroidism   Renal/GU negative Renal ROS  negative genitourinary   Musculoskeletal negative musculoskeletal ROS (+)   Abdominal   Peds negative pediatric ROS (+)  Hematology negative hematology ROS (+)   Anesthesia Other Findings   Reproductive/Obstetrics negative OB ROS                             Anesthesia Physical Anesthesia Plan  ASA: III  Anesthesia Plan: General   Post-op Pain Management:    Induction: Intravenous  PONV Risk Score and Plan: 3 and Ondansetron, Dexamethasone and Treatment may vary due to age or medical condition  Airway Management Planned: Oral ETT  Additional Equipment:   Intra-op Plan:   Post-operative Plan: Extubation in OR  Informed Consent: I have reviewed the patients History and Physical, chart, labs and discussed the procedure including the risks, benefits and alternatives for the proposed anesthesia with the patient or authorized representative who has indicated his/her understanding and acceptance.     Dental advisory given  Plan Discussed with: CRNA and Surgeon  Anesthesia Plan Comments:         Anesthesia Quick Evaluation

## 2018-09-27 NOTE — Interval H&P Note (Signed)
History and Physical Interval Note:  09/27/2018 9:25 AM  [name]  has presented today for surgery, with the diagnosis of VULVAR CANCER.  The various methods of treatment have been discussed with the patient and family. After consideration of risks, benefits and other options for treatment, the patient has consented to  Procedure(s): RADICAL VULVECTOMY (N/A) as a surgical intervention.  The patient's history has been reviewed, patient examined, no change in status, stable for surgery.  I have reviewed the patient's chart and labs.  Questions were answered to the patient's satisfaction.     [place] A.

## 2018-09-27 NOTE — Anesthesia Postprocedure Evaluation (Signed)
Anesthesia Post Note  Patient: [name]  Procedure(s) Performed: RADICAL VULVECTOMY (N/A )     Patient location during evaluation: PACU Anesthesia Type: General Level of consciousness: awake and alert Pain management: pain level controlled Vital Signs Assessment: post-procedure vital signs reviewed and stable Respiratory status: spontaneous breathing, nonlabored ventilation, respiratory function stable and patient connected to nasal cannula oxygen Cardiovascular status: blood pressure returned to baseline and stable Postop Assessment: no apparent nausea or vomiting Anesthetic complications: no    Last Vitals:  Vitals:   09/27/18 1145 09/27/18 1200  BP: 117/62 110/64  Pulse:  61  Resp: 12 12  Temp:    SpO2:  100%    Last Pain:  Vitals:   09/27/18 1145  TempSrc:   PainSc: 1                  [name] S

## 2018-09-28 ENCOUNTER — Telehealth: Payer: Self-pay

## 2018-09-28 ENCOUNTER — Encounter (HOSPITAL_COMMUNITY): Payer: Self-pay | Admitting: Gynecologic Oncology

## 2018-09-28 NOTE — Telephone Encounter (Signed)
[name] states that she is doing well after her surgery. She is not in any pain. Has not needed to take any pain medication. Eating, drinking, and urinating well. Continuing the bactrim for pre op UTI. Afebrile. Passing gas. No BM. Pt knows to call [phone]  if she has any questions or concerns.

## 2018-10-04 ENCOUNTER — Telehealth: Payer: Self-pay | Admitting: Oncology

## 2018-10-04 ENCOUNTER — Telehealth: Payer: Self-pay | Admitting: Gynecologic Oncology

## 2018-10-04 DIAGNOSIS — C519 Malignant neoplasm of vulva, unspecified: Secondary | ICD-10-CM

## 2018-10-04 NOTE — Telephone Encounter (Signed)
I spoke with the patient and her daughter after the patient gave consent.  We discussed her pathology.  Fortunately she had negative margins.  However, she had a significant depth of invasion of 1.3 cm.  With this in mind her risk of nodal metastasis is anywhere from 25 to 30%.  We discussed trying to see if we could get a PET/CT for the date that she returns to see Dr. Denman George next week.  They would very much like for [place] to be able to arrange that.  Then, based on the results of the PET/CT we would consider moving forward with lymphadenectomy.  The patient states that her pain is very well controlled and she actually feels better now than she did before the surgery.  Their questions were elicited and answered to their satisfaction.  PG

## 2018-10-04 NOTE — Telephone Encounter (Signed)
Called patient's daughter, Sharyon Medicus, and advised her of appointment for PET scan on 10/06/18 at 2 pm (NPO 6 hours before, no carbs).  She verbalized understanding and agreement.

## 2018-10-05 ENCOUNTER — Ambulatory Visit: Payer: Medicare Other | Admitting: Gynecologic Oncology

## 2018-10-06 ENCOUNTER — Encounter (HOSPITAL_COMMUNITY)
Admission: RE | Admit: 2018-10-06 | Discharge: 2018-10-06 | Disposition: A | Discharge status: Home / Self Care | Payer: Medicare Other | Source: Ambulatory Visit | Attending: Gynecologic Oncology | Admitting: Gynecologic Oncology

## 2018-10-06 ENCOUNTER — Other Ambulatory Visit: Payer: Self-pay

## 2018-10-06 DIAGNOSIS — R932 Abnormal findings on diagnostic imaging of liver and biliary tract: Secondary | ICD-10-CM | POA: Diagnosis not present

## 2018-10-06 DIAGNOSIS — I700 Atherosclerosis of aorta: Secondary | ICD-10-CM | POA: Diagnosis not present

## 2018-10-06 DIAGNOSIS — I77811 Abdominal aortic ectasia: Secondary | ICD-10-CM | POA: Insufficient documentation

## 2018-10-06 DIAGNOSIS — I2510 Atherosclerotic heart disease of native coronary artery without angina pectoris: Secondary | ICD-10-CM | POA: Insufficient documentation

## 2018-10-06 DIAGNOSIS — C519 Malignant neoplasm of vulva, unspecified: Secondary | ICD-10-CM | POA: Diagnosis present

## 2018-10-06 LAB — GLUCOSE, CAPILLARY: Glucose-Capillary: 106 mg/dL — ABNORMAL HIGH (ref 70–99)

## 2018-10-06 MED ORDER — FLUDEOXYGLUCOSE F - 18 (FDG) INJECTION
8.2500 | Freq: Once | INTRAVENOUS | Status: AC | PRN
  Administered 2018-10-06: 8.25 via INTRAVENOUS

## 2018-10-10 ENCOUNTER — Inpatient Hospital Stay: Payer: Medicare Other | Attending: Gynecologic Oncology | Admitting: Gynecologic Oncology

## 2018-10-10 ENCOUNTER — Encounter: Payer: Self-pay | Admitting: Gynecologic Oncology

## 2018-10-10 ENCOUNTER — Other Ambulatory Visit: Payer: Self-pay

## 2018-10-10 ENCOUNTER — Other Ambulatory Visit: Payer: Self-pay | Admitting: Gynecologic Oncology

## 2018-10-10 VITALS — BP 136/80 | HR 73 | Temp 97.8°F | Resp 18 | Ht 65.0 in | Wt 154.7 lb

## 2018-10-10 DIAGNOSIS — Z9079 Acquired absence of other genital organ(s): Secondary | ICD-10-CM

## 2018-10-10 DIAGNOSIS — Z79899 Other long term (current) drug therapy: Secondary | ICD-10-CM | POA: Diagnosis not present

## 2018-10-10 DIAGNOSIS — Z87891 Personal history of nicotine dependence: Secondary | ICD-10-CM | POA: Diagnosis not present

## 2018-10-10 DIAGNOSIS — E039 Hypothyroidism, unspecified: Secondary | ICD-10-CM | POA: Diagnosis not present

## 2018-10-10 DIAGNOSIS — Z90710 Acquired absence of both cervix and uterus: Secondary | ICD-10-CM | POA: Diagnosis not present

## 2018-10-10 DIAGNOSIS — E785 Hyperlipidemia, unspecified: Secondary | ICD-10-CM | POA: Insufficient documentation

## 2018-10-10 DIAGNOSIS — Z7982 Long term (current) use of aspirin: Secondary | ICD-10-CM | POA: Diagnosis not present

## 2018-10-10 DIAGNOSIS — Z79891 Long term (current) use of opiate analgesic: Secondary | ICD-10-CM | POA: Diagnosis not present

## 2018-10-10 DIAGNOSIS — Z9889 Other specified postprocedural states: Secondary | ICD-10-CM | POA: Insufficient documentation

## 2018-10-10 DIAGNOSIS — C519 Malignant neoplasm of vulva, unspecified: Secondary | ICD-10-CM

## 2018-10-10 DIAGNOSIS — Z7189 Other specified counseling: Secondary | ICD-10-CM

## 2018-10-10 DIAGNOSIS — I10 Essential (primary) hypertension: Secondary | ICD-10-CM | POA: Diagnosis not present

## 2018-10-10 NOTE — Progress Notes (Signed)
Consult Note: Gyn-Onc  Consult was requested by Dr. Modesta Messing for the evaluation of [name] 78 y.o. female  CC:  Chief Complaint  Patient presents with  . Vulvar Cancer    Assessment/Plan:  [name]  is a 78 y.o.  year old with clinical stage IB vulvar carcinoma.  We recommend surgical staging with bilateral inguinofemoral lymphadenectomy.  I reviewed the images of the 10/06/18 PET and felt that an isolated liver metastasis was unlikely.  We will follow this up with repeat pet imaging in 3 months time.  I discussed the procedure, its risks and its anticipated postoperative recovery to the patient and her daughter.  She will have surgery scheduled for 8/18 with Dr Alycia Rossetti who performed her first surgery.   HPI: [name] is a 78 year old P3 who was seen in consultation at the request of Dr. 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  The patient reported experiencing a palpable lump in the winter 2019 and 2020.  This is on the anterior portion of her vulva.  She put off seeing physician for this because she reported that she was busy.  It became more noticeable in April 2020 and she attempted to have an appointment with a gynecologist however she could not get an appointment due to the the coronavirus shot downs.  She was eventually able to see a physician on August 25, 2018 at which time evaluation of the vulva confirmed a vulvar mass.  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  The patient's medical history is significant for a remote history of cervical cancer diagnosed in 2008.  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 Marti Sleigh.  She has had no other prior abdominal surgeries.  She is an ex-smoker and quit in October 2004.  She is a nondrinker.  She denies urinary incontinence.  Her most significant medical comorbidities right now is trigeminal neuralgia.  She has had gamma  knife radiation to treat this.  She is retired.  She lives alone.  Symptoms from this mass included discomfort, and occasional pruritus, and an occasional bloody discharge.  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 She was taken to the OR on 09/27/18 with Dr Alycia Rossetti for a radical anterior vulvectomy.  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  The resection margins were negative for carcinoma with the closest being the lateral margin 1:00 at 0.8 cm.  The carcinoma invaded 1.3 cm depth.  There was no lymphovascular or perineural invasion identified.  Postoperatively a PET/CT was obtained on October 06, 2018.  This revealed postoperative changes to the vulva.  There was no hypermetabolic local regional lymphadenopathy.  There is a questionable subtle focus of hypermetabolism in the left lobe of the liver, indeterminate for liver metastases.  MRI abdomen could be performed for further work-up.  No other potential findings of hypermetabolic distant metastatic disease were seen.  The patient reported that she did well after surgery with no specific complaints.  Current Meds:  Outpatient Encounter Medications as of 10/10/2018  Medication Sig  . aspirin 81 MG chewable tablet Chew 81 mg by mouth daily.  . Cholecalciferol (VITAMIN D3) 50 MCG (2000 UT) capsule Take 2,000 Units by mouth daily.   . fish oil-omega-3 fatty acids 1000 MG capsule Take 4 capsules by mouth daily. Dose 1200mg / 360mg  Omega 3   . hydrochlorothiazide (HYDRODIURIL) 25 MG tablet Take 25 mg by  mouth daily.  Marland Kitchen ibuprofen (ADVIL) 600 MG tablet Take 1 tablet (600 mg total) by mouth every 6 (six) hours as needed. For AFTER surgery  . levothyroxine (SYNTHROID) 75 MCG tablet Take 75 mcg by mouth daily before breakfast.  . Oxcarbazepine (TRILEPTAL) 300 MG tablet Take 150 mg by mouth daily as needed.   . Rosuvastatin Calcium (CRESTOR PO) Take 5 mg by mouth daily.   Marland Kitchen senna-docusate  (SENOKOT-S) 8.6-50 MG tablet Take 2 tablets by mouth at bedtime. For AFTER surgery, do not take if having diarrhea  . sulfamethoxazole-trimethoprim (BACTRIM DS) 800-160 MG tablet Take 1 tablet by mouth 2 (two) times daily.  . traMADol (ULTRAM) 50 MG tablet Take 1 tablet (50 mg total) by mouth every 6 (six) hours as needed. For AFTER surgery, do not take and drive   No facility-administered encounter medications on file as of 10/10/2018.     Allergy:  Allergies  Allergen Reactions  . Prednisone Other (See Comments)    Feels Dizzy  . Codeine Anxiety    "Jumpy inside"    Social Hx:   Social History   Socioeconomic History  . Marital status: Widowed    Spouse name: Not on file  . Number of children: Not on file  . Years of education: Not on file  . Highest education level: Not on file  Occupational History  . Not on file  Social Needs  . Financial resource strain: Not on file  . Food insecurity    Worry: Not on file    Inability: Not on file  . Transportation needs    Medical: Not on file    Non-medical: Not on file  Tobacco Use  . Smoking status: Former Smoker    Quit date: 12/08/2003    Years since quitting: 14.8  . Smokeless tobacco: Never Used  Substance and Sexual Activity  . Alcohol use: No  . Drug use: No  . Sexual activity: Not Currently  Lifestyle  . Physical activity    Days per week: Not on file    Minutes per session: Not on file  . Stress: Not on file  Relationships  . Social Herbalist on phone: Not on file    Gets together: Not on file    Attends religious service: Not on file    Active member of club or organization: Not on file    Attends meetings of clubs or organizations: Not on file    Relationship status: Not on file  . Intimate partner violence    Fear of current or ex partner: Not on file    Emotionally abused: Not on file    Physically abused: Not on file    Forced sexual activity: Not on file  Other Topics Concern  . Not on  file  Social History Narrative  . Not on file    Past Surgical Hx:  Past Surgical History:  Procedure Laterality Date  . ABDOMINAL HYSTERECTOMY     Pelvic lymphadenectomy  . BREAST SURGERY     left breast cyst in areolar area 47 years ago  . RADICAL VULVECTOMY N/A 09/27/2018   Procedure: RADICAL VULVECTOMY;  Surgeon: [name], MD;  Location: [facility];  Service: Gynecology;  Laterality: N/A;  . SUPRAPUBIC CATHETER PLACEMENT      Past Medical Hx:  Past Medical History:  Diagnosis Date  . Complication of anesthesia    slow to wake up   . Hyperlipidemia   . Hypertension   .  Hypothyroidism   . 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  No LMP recorded. Patient is postmenopausal.  Family Hx:  Family History  Problem Relation Age of Onset  . Skin cancer Father     Review of Systems:  Constitutional  Feels well,   ENT Normal appearing ears and nares bilaterally Skin/Breast  No rash, sores, jaundice, itching, dryness Cardiovascular  No chest pain, shortness of breath, or edema  Pulmonary  No cough or wheeze.  Gastro Intestinal  No nausea, vomitting, or diarrhoea. No bright red blood per rectum, no abdominal pain, change in bowel movement, or constipation.  Genito Urinary  No frequency, urgency, dysuria, + vulvar pruritis and vulvodynia Musculo Skeletal  No myalgia, arthralgia, joint swelling or pain  Neurologic  No weakness, numbness, change in gait,  Psychology  No depression, anxiety, insomnia.   Vitals:  Blood pressure 136/80, pulse 73, temperature 97.8 F (36.6 C), temperature source Oral, resp. rate 18, height 5\' 5"  (1.651 m), weight 154 lb 11.2 oz (70.2 kg), SpO2 96 %.  Physical Exam: WD in NAD Neck  Supple NROM, without any enlargements.  Lymph Node Survey No cervical supraclavicular or inguinal  adenopathy Cardiovascular  Pulse normal rate, regularity and rhythm. S1 and S2 normal.  Lungs  Clear to auscultation bilateraly, without wheezes/crackles/rhonchi. Good air movement.  Skin  No rash/lesions/breakdown  Psychiatry  Alert and oriented to person, place, and time  Abdomen  Normoactive bowel sounds, abdomen soft, non-tender and obese without evidence of hernia. Back No CVA tenderness Genito Urinary  Vulva/vagina: A well healed anterior vulvar incision. No breakdown or cellulitis.  Rectal  deferred.  Extremities  No bilateral cyanosis, clubbing or edema.   30 minutes of direct face to face counseling time was spent with the patient. This included discussion about prognosis, therapy recommendations and postoperative side effects and are beyond the scope of routine postoperative care.   Thereasa Solo, MD  10/10/2018, 2:21 PM

## 2018-10-10 NOTE — Patient Instructions (Signed)
Preparing for your Surgery  Plan for surgery on October 25, 2018 with Dr. [name] at [place] will be scheduled for a bilateral inguinal lymphadenectomy, drain placement.   Pre-operative Testing -You will receive a phone call from presurgical testing at [facility] if you have not received a call already to arrange for a pre-operative testing appointment before your surgery.  This appointment normally occurs one to two weeks before your scheduled surgery.   -Bring your insurance card, copy of an advanced directive if applicable, medication list  -At that visit, you will be asked to sign a consent for a possible blood transfusion in case a transfusion becomes necessary during surgery.  The need for a blood transfusion is rare but having consent is a necessary part of your care.     -You should not be taking blood thinners or aspirin at least ten days prior to surgery unless instructed by your surgeon.  -Do not take supplements such as fish oil (omega 3), red yeast rice, tumeric before your surgery.  Day Before Surgery at [place] will be advised to have nothing to eat or drink after midnight the evening before.    Your role in recovery Your role is to become active as soon as directed by your doctor, while still giving yourself time to heal.  Rest when you feel tired. You will be asked to do the following in order to speed your recovery:  - Cough and breathe deeply. This helps toclear and expand your lungs and can prevent pneumonia. You may be given a spirometer to practice deep breathing. A staff member will show you how to use the spirometer. - Do mild physical activity. Walking or moving your legs help your circulation and body functions return to normal. A staff member will help you when you try to walk and will provide you with simple exercises. Do not try to get up or walk alone the first time. - Actively manage your pain. Managing your pain lets you move in  comfort. We will ask you to rate your pain on a scale of zero to 10. It is your responsibility to tell your doctor or nurse where and how much you hurt so your pain can be treated.  Special Considerations -If you are diabetic, you may be placed on insulin after surgery to have closer control over your blood sugars to promote healing and recovery.  This does not mean that you will be discharged on insulin.  If applicable, your oral antidiabetics will be resumed when you are tolerating a solid diet.  -Your final pathology results from surgery should be available around one week after surgery and the results will be relayed to you when available.  -Dr. Lahoma Crocker is the surgeon that assists your GYN Oncologist with surgery.  If you end up staying the night, the next day after your surgery you will either see Dr. Denman George or Dr. Lahoma Crocker.  -FMLA forms can be faxed to [phone] and please allow 5-7 business days for completion.  Blood Transfusion Information WHAT IS A BLOOD TRANSFUSION? A transfusion is the replacement of blood or some of its parts. Blood is made up of multiple cells which provide different functions.  Red blood cells carry oxygen and are used for blood loss replacement.  White blood cells fight against infection.  Platelets control bleeding.  Plasma helps clot blood.  Other blood products are available for specialized needs, such as hemophilia or other clotting disorders.  BEFORE THE TRANSFUSION  Who gives blood for transfusions?   You may be able to donate blood to be used at a later date on yourself (autologous donation).  Relatives can be asked to donate blood. This is generally not any safer than if you have received blood from a stranger. The same precautions are taken to ensure safety when a relative's blood is donated.  Healthy volunteers who are fully evaluated to make sure their blood is safe. This is blood bank blood. Transfusion therapy is the  safest it has ever been in the practice of medicine. Before blood is taken from a donor, a complete history is taken to make sure that person has no history of diseases nor engages in risky social behavior (examples are intravenous drug use or sexual activity with multiple partners). The donor's travel history is screened to minimize risk of transmitting infections, such as malaria. The donated blood is tested for signs of infectious diseases, such as HIV and hepatitis. The blood is then tested to be sure it is compatible with you in order to minimize the chance of a transfusion reaction. If you or a relative donates blood, this is often done in anticipation of surgery and is not appropriate for emergency situations. It takes many days to process the donated blood. RISKS AND COMPLICATIONS Although transfusion therapy is very safe and saves many lives, the main dangers of transfusion include:   Getting an infectious disease.  Developing a transfusion reaction. This is an allergic reaction to something in the blood you were given. Every precaution is taken to prevent this. The decision to have a blood transfusion has been considered carefully by your caregiver before blood is given. Blood is not given unless the benefits outweigh the risks.  Surgical [facility] Surgical drains are used to remove extra fluid that normally builds up in a surgical wound after surgery. A surgical drain helps to heal a surgical wound. Different kinds of surgical drains include:  Active drains. These drains use suction to pull drainage away from the surgical wound. Drainage flows through a tube to a container outside of the body. With these drains, you need to keep the bulb or the drainage container flat (compressed) at all times, except while you empty it. Flattening the bulb or container creates suction.  Passive drains. These drains allow fluid to drain naturally, by gravity. Drainage flows through a tube to a bandage  (dressing) or a container outside of the body. Passive drains do not need to be emptied. A drain is placed during surgery. Right after surgery, drainage is usually bright red and a little thicker than water. The drainage may gradually turn yellow or pink and become thinner. It is likely that your health care provider will remove the drain when the drainage stops or when the amount decreases to 1-2 Tbsp (15-30 mL) during a 24-hour period. Supplies needed:  Tape.  Germ-free cleaning solution (sterile saline).  Cotton swabs.  Split gauze drain sponge: 4 x 4 inches (10 x 10 cm).  Gauze square: 4 x 4 inches (10 x 10 cm). How to care for your surgical drain Care for your drain as told by your health care provider. This is important to help prevent infection. If your drain is placed at your back, or any other hard-to-reach area, ask another person to assist you in performing the following tasks: General care  Keep the skin around the drain dry and covered with a dressing at all times.  Check  your drain area every day for signs of infection. Check for: ? Redness, swelling, or pain. ? Pus or a bad smell. ? Cloudy drainage. ? Tenderness or pressure at the drain exit site. Changing the dressing Follow instructions from your health care provider about how to change your dressing. Change your dressing at least once a day. Change it more often if needed to keep the dressing dry. Make sure you: 1. Gather your supplies. 2. Wash your hands with soap and water before you change your dressing. If soap and water are not available, use hand sanitizer. 3. Remove the old dressing. Avoid using scissors to do that. 4. Wash your hands with soap and water again after removing the old dressing. 5. Use sterile saline to clean your skin around the drain. You may need to use a cotton swab to clean the skin. 6. Place the tube through the slit in a drain sponge. Place the drain sponge so that it covers your wound. 7.  Place the gauze square or another drain sponge on top of the drain sponge that is on the wound. Make sure the tube is between those layers. 8. Tape the dressing to your skin. 9. Tape the drainage tube to your skin 1-2 inches (2.5-5 cm) below the place where the tube enters your body. Taping keeps the tube from pulling on any stitches (sutures) that you have. 10. Wash your hands with soap and water. 11. Write down the color of your drainage and how often you change your dressing. How to empty your active drain  1. Make sure that you have a measuring cup that you can empty your drainage into. 2. Wash your hands with soap and water. If soap and water are not available, use hand sanitizer. 3. Loosen any pins or clips that hold the tube in place. 4. If your health care provider tells you to strip the tube to prevent clots and tube blockages: ? Hold the tube at the skin with one hand. Use your other hand to pinch the tubing with your thumb and first finger. ? Gently move your fingers down the tube while squeezing very lightly. This clears any drainage, clots, or tissue from the tube. ? You may need to do this several times each day to keep the tube clear. Do not pull on the tube. 5. Open the bulb cap or the drain plug. Do not touch the inside of the cap or the bottom of the plug. 6. Turn the device upside down and gently squeeze. 7. Empty all of the drainage into the measuring cup. 8. Compress the bulb or the container and replace the cap or the plug. To compress the bulb or the container, squeeze it firmly in the middle while you close the cap or plug the container. 9. Write down the amount of drainage that you have in each 24-hour period. If you have less than 2 Tbsp (30 mL) of drainage during 24 hours, contact your health care provider. 10. Flush the drainage down the toilet. 11. Wash your hands with soap and water. Contact a health care provider if:  You have redness, swelling, or pain around  your drain area.  You have pus or a bad smell coming from your drain area.  You have a fever or chills.  The skin around your drain is warm to the touch.  The amount of drainage that you have is increasing instead of decreasing.  You have drainage that is cloudy.  There is a sudden  stop or a sudden decrease in the amount of drainage that you have.  Your drain tube falls out.  Your active drain does not stay compressed after you empty it. Summary  Surgical drains are used to remove extra fluid that normally builds up in a surgical wound after surgery.  Different kinds of surgical drains include active drains and passive drains. Active drains use suction to pull drainage away from the surgical wound, and passive drains allow fluid to drain naturally.  It is important to care for your drain to prevent infection. If your drain is placed at your back, or any other hard-to-reach area, ask another person to assist you.  Contact your health care provider if you have redness, swelling, or pain around your drain area. This information is not intended to replace advice given to you by your health care provider. Make sure you discuss any questions you have with your health care provider. Document Released: 02/21/2000 Document Revised: 03/30/2018 Document Reviewed: 03/30/2018 Elsevier Patient Education  2020 [employer].

## 2018-10-10 NOTE — H&P (View-Only) (Signed)
Consult Note: Gyn-Onc  Consult was requested by Dr. Modesta Messing for the evaluation of [name] 78 y.o. female  CC:  Chief Complaint  Patient presents with  . Vulvar Cancer    Assessment/Plan:  [name]  is a 78 y.o.  year old with clinical stage IB vulvar carcinoma.  We recommend surgical staging with bilateral inguinofemoral lymphadenectomy.  I reviewed the images of the 10/06/18 PET and felt that an isolated liver metastasis was unlikely.  We will follow this up with repeat pet imaging in 3 months time.  I discussed the procedure, its risks and its anticipated postoperative recovery to the patient and her daughter.  She will have surgery scheduled for 8/18 with Dr Alycia Rossetti who performed her first surgery.   HPI: [name] is a 77 year old P3 who was seen in consultation at the request of Dr. 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  The patient reported experiencing a palpable lump in the winter 2019 and 2020.  This is on the anterior portion of her vulva.  She put off seeing physician for this because she reported that she was busy.  It became more noticeable in April 2020 and she attempted to have an appointment with a gynecologist however she could not get an appointment due to the the coronavirus shot downs.  She was eventually able to see a physician on August 25, 2018 at which time evaluation of the vulva confirmed a vulvar mass.  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  The patient's medical history is significant for a remote history of cervical cancer diagnosed in 2008.  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 Marti Sleigh.  She has had no other prior abdominal surgeries.  She is an ex-smoker and quit in October 2004.  She is a nondrinker.  She denies urinary incontinence.  Her most significant medical comorbidities right now is trigeminal neuralgia.  She has had gamma  knife radiation to treat this.  She is retired.  She lives alone.  Symptoms from this mass included discomfort, and occasional pruritus, and an occasional bloody discharge.  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 She was taken to the OR on 09/27/18 with Dr Alycia Rossetti for a radical anterior vulvectomy.  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  The resection margins were negative for carcinoma with the closest being the lateral margin 1:00 at 0.8 cm.  The carcinoma invaded 1.3 cm depth.  There was no lymphovascular or perineural invasion identified.  Postoperatively a PET/CT was obtained on October 06, 2018.  This revealed postoperative changes to the vulva.  There was no hypermetabolic local regional lymphadenopathy.  There is a questionable subtle focus of hypermetabolism in the left lobe of the liver, indeterminate for liver metastases.  MRI abdomen could be performed for further work-up.  No other potential findings of hypermetabolic distant metastatic disease were seen.  The patient reported that she did well after surgery with no specific complaints.  Current Meds:  Outpatient Encounter Medications as of 10/10/2018  Medication Sig  . aspirin 81 MG chewable tablet Chew 81 mg by mouth daily.  . Cholecalciferol (VITAMIN D3) 50 MCG (2000 UT) capsule Take 2,000 Units by mouth daily.   . fish oil-omega-3 fatty acids 1000 MG capsule Take 4 capsules by mouth daily. Dose 1200mg / 360mg  Omega 3   . hydrochlorothiazide (HYDRODIURIL) 25 MG tablet Take 25 mg by  mouth daily.  Marland Kitchen ibuprofen (ADVIL) 600 MG tablet Take 1 tablet (600 mg total) by mouth every 6 (six) hours as needed. For AFTER surgery  . levothyroxine (SYNTHROID) 75 MCG tablet Take 75 mcg by mouth daily before breakfast.  . Oxcarbazepine (TRILEPTAL) 300 MG tablet Take 150 mg by mouth daily as needed.   . Rosuvastatin Calcium (CRESTOR PO) Take 5 mg by mouth daily.   Marland Kitchen senna-docusate  (SENOKOT-S) 8.6-50 MG tablet Take 2 tablets by mouth at bedtime. For AFTER surgery, do not take if having diarrhea  . sulfamethoxazole-trimethoprim (BACTRIM DS) 800-160 MG tablet Take 1 tablet by mouth 2 (two) times daily.  . traMADol (ULTRAM) 50 MG tablet Take 1 tablet (50 mg total) by mouth every 6 (six) hours as needed. For AFTER surgery, do not take and drive   No facility-administered encounter medications on file as of 10/10/2018.     Allergy:  Allergies  Allergen Reactions  . Prednisone Other (See Comments)    Feels Dizzy  . Codeine Anxiety    "Jumpy inside"    Social Hx:   Social History   Socioeconomic History  . Marital status: Widowed    Spouse name: Not on file  . Number of children: Not on file  . Years of education: Not on file  . Highest education level: Not on file  Occupational History  . Not on file  Social Needs  . Financial resource strain: Not on file  . Food insecurity    Worry: Not on file    Inability: Not on file  . Transportation needs    Medical: Not on file    Non-medical: Not on file  Tobacco Use  . Smoking status: Former Smoker    Quit date: 12/08/2003    Years since quitting: 14.8  . Smokeless tobacco: Never Used  Substance and Sexual Activity  . Alcohol use: No  . Drug use: No  . Sexual activity: Not Currently  Lifestyle  . Physical activity    Days per week: Not on file    Minutes per session: Not on file  . Stress: Not on file  Relationships  . Social Herbalist on phone: Not on file    Gets together: Not on file    Attends religious service: Not on file    Active member of club or organization: Not on file    Attends meetings of clubs or organizations: Not on file    Relationship status: Not on file  . Intimate partner violence    Fear of current or ex partner: Not on file    Emotionally abused: Not on file    Physically abused: Not on file    Forced sexual activity: Not on file  Other Topics Concern  . Not on  file  Social History Narrative  . Not on file    Past Surgical Hx:  Past Surgical History:  Procedure Laterality Date  . ABDOMINAL HYSTERECTOMY     Pelvic lymphadenectomy  . BREAST SURGERY     left breast cyst in areolar area 47 years ago  . RADICAL VULVECTOMY N/A 09/27/2018   Procedure: RADICAL VULVECTOMY;  Surgeon: [name], MD;  Location: [facility];  Service: Gynecology;  Laterality: N/A;  . SUPRAPUBIC CATHETER PLACEMENT      Past Medical Hx:  Past Medical History:  Diagnosis Date  . Complication of anesthesia    slow to wake up   . Hyperlipidemia   . Hypertension   .  Hypothyroidism   . 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  No LMP recorded. Patient is postmenopausal.  Family Hx:  Family History  Problem Relation Age of Onset  . Skin cancer Father     Review of Systems:  Constitutional  Feels well,   ENT Normal appearing ears and nares bilaterally Skin/Breast  No rash, sores, jaundice, itching, dryness Cardiovascular  No chest pain, shortness of breath, or edema  Pulmonary  No cough or wheeze.  Gastro Intestinal  No nausea, vomitting, or diarrhoea. No bright red blood per rectum, no abdominal pain, change in bowel movement, or constipation.  Genito Urinary  No frequency, urgency, dysuria, + vulvar pruritis and vulvodynia Musculo Skeletal  No myalgia, arthralgia, joint swelling or pain  Neurologic  No weakness, numbness, change in gait,  Psychology  No depression, anxiety, insomnia.   Vitals:  Blood pressure 136/80, pulse 73, temperature 97.8 F (36.6 C), temperature source Oral, resp. rate 18, height 5\' 5"  (1.651 m), weight 154 lb 11.2 oz (70.2 kg), SpO2 96 %.  Physical Exam: WD in NAD Neck  Supple NROM, without any enlargements.  Lymph Node Survey No cervical supraclavicular or inguinal  adenopathy Cardiovascular  Pulse normal rate, regularity and rhythm. S1 and S2 normal.  Lungs  Clear to auscultation bilateraly, without wheezes/crackles/rhonchi. Good air movement.  Skin  No rash/lesions/breakdown  Psychiatry  Alert and oriented to person, place, and time  Abdomen  Normoactive bowel sounds, abdomen soft, non-tender and obese without evidence of hernia. Back No CVA tenderness Genito Urinary  Vulva/vagina: A well healed anterior vulvar incision. No breakdown or cellulitis.  Rectal  deferred.  Extremities  No bilateral cyanosis, clubbing or edema.   30 minutes of direct face to face counseling time was spent with the patient. This included discussion about prognosis, therapy recommendations and postoperative side effects and are beyond the scope of routine postoperative care.   Thereasa Solo, MD  10/10/2018, 2:21 PM

## 2018-10-19 NOTE — Patient Instructions (Addendum)
YOU NEED TO HAVE A COVID 19 TEST ON     8-14-20_______ @_______ , THIS TEST MUST BE DONE BEFORE SURGERY, COME  [address] [place] , [address]. ONCE YOUR COVID TEST IS COMPLETED, PLEASE BEGIN THE QUARANTINE INSTRUCTIONS AS OUTLINED IN YOUR HANDOUT.                [name]    Your procedure is scheduled on: 10-25-2018   Report to [facility] Main  Entrance                 Report to admitting at        730  AM   [address] [place].    Call this number if you have problems the morning of surgery [phone]    Remember: Do not eat food or drink liquids :After Midnight. BRUSH YOUR TEETH MORNING OF SURGERY AND RINSE YOUR MOUTH OUT, NO CHEWING GUM CANDY OR MINTS.     Take these medicines the morning of surgery with A SIP OF WATER: levothyroxine, inhaler if needed and bring with you                                You may not have any metal on your body including hair pins and              piercings  Do not wear jewelry, make-up, lotions, powders or perfumes, deodorant             Do not wear nail polish.  Do not shave  48 hours prior to surgery.       Do not bring valuables to the hospital. [place].  Contacts, dentures or bridgework may not be worn into surgery.   ____________________________________________________________________           [facility] - Preparing for Surgery Before surgery, you can play an important role.  Because skin is not sterile, your skin needs to be as free of germs as possible.  You can reduce the number of germs on your skin by washing with CHG (chlorahexidine gluconate) soap before surgery.  CHG is an antiseptic cleaner which kills germs and bonds with the skin to continue killing germs even after washing. Please DO NOT use if you have an allergy to CHG or antibacterial soaps.  If your skin becomes reddened/irritated stop using the CHG  and inform your nurse when you arrive at Short Stay. Do not shave (including legs and underarms) for at least 48 hours prior to the first CHG shower.  You may shave your face/neck. Please follow these instructions carefully:  1.  Shower with CHG Soap the night before surgery and the  morning of Surgery.  2.  If you choose to wash your hair, wash your hair first as usual with your  normal  shampoo.  3.  After you shampoo, rinse your hair and body thoroughly to remove the  shampoo.                           4.  Use CHG as you would any other liquid soap.  You can apply chg directly  to the skin and wash  Gently with a scrungie or clean washcloth.  5.  Apply the CHG Soap to your body ONLY FROM THE NECK DOWN.   Do not use on face/ open                           Wound or open sores. Avoid contact with eyes, ears mouth and genitals (private parts).                       Wash face,  Genitals (private parts) with your normal soap.             6.  Wash thoroughly, paying special attention to the area where your surgery  will be performed.  7.  Thoroughly rinse your body with warm water from the neck down.  8.  DO NOT shower/wash with your normal soap after using and rinsing off  the CHG Soap.                9.  Pat yourself dry with a clean towel.            10.  Wear clean pajamas.            11.  Place clean sheets on your bed the night of your first shower and do not  sleep with pets. Day of Surgery : Do not apply any lotions/deodorants the morning of surgery.  Please wear clean clothes to the hospital/surgery center.  FAILURE TO FOLLOW THESE INSTRUCTIONS MAY RESULT IN THE CANCELLATION OF YOUR SURGERY PATIENT SIGNATURE_________________________________  NURSE SIGNATURE__________________________________  ________________________________________________________________________   [name]  An incentive spirometer is a tool that can help keep your lungs clear and  active. This tool measures how well you are filling your lungs with each breath. Taking long deep breaths may help reverse or decrease the chance of developing breathing (pulmonary) problems (especially infection) following:  A long period of time when you are unable to move or be active. BEFORE THE PROCEDURE   If the spirometer includes an indicator to show your best effort, your nurse or respiratory therapist will set it to a desired goal.  If possible, sit up straight or lean slightly forward. Try not to slouch.  Hold the incentive spirometer in an upright position. INSTRUCTIONS FOR USE  1. Sit on the edge of your bed if possible, or sit up as far as you can in bed or on a chair. 2. Hold the incentive spirometer in an upright position. 3. Breathe out normally. 4. Place the mouthpiece in your mouth and seal your lips tightly around it. 5. Breathe in slowly and as deeply as possible, raising the piston or the ball toward the top of the column. 6. Hold your breath for 3-5 seconds or for as long as possible. Allow the piston or ball to fall to the bottom of the column. 7. Remove the mouthpiece from your mouth and breathe out normally. 8. Rest for a few seconds and repeat Steps 1 through 7 at least 10 times every 1-2 hours when you are awake. Take your time and take a few normal breaths between deep breaths. 9. The spirometer may include an indicator to show your best effort. Use the indicator as a goal to work toward during each repetition. 10. After each set of 10 deep breaths, practice coughing to be sure your lungs are clear. If you have an incision (the cut made at the time of surgery),  support your incision when coughing by placing a pillow or rolled up towels firmly against it. Once you are able to get out of bed, walk around indoors and cough well. You may stop using the incentive spirometer when instructed by your caregiver.  RISKS AND COMPLICATIONS  Take your time so you do not get  dizzy or light-headed.  If you are in pain, you may need to take or ask for pain medication before doing incentive spirometry. It is harder to take a deep breath if you are having pain. AFTER USE  Rest and breathe slowly and easily.  It can be helpful to keep track of a log of your progress. Your caregiver can provide you with a simple table to help with this. If you are using the spirometer at home, follow these instructions: [place] IF:   You are having difficultly using the spirometer.  You have trouble using the spirometer as often as instructed.  Your pain medication is not giving enough relief while using the spirometer.  You develop fever of 100.5 F (38.1 C) or higher. SEEK IMMEDIATE MEDICAL CARE IF:   You cough up bloody sputum that had not been present before.  You develop fever of 102 F (38.9 C) or greater.  You develop worsening pain at or near the incision site. MAKE SURE YOU:   Understand these instructions.  Will watch your condition.  Will get help right away if you are not doing well or get worse. Document Released: 07/06/2006 Document Revised: 05/18/2011 Document Reviewed: 09/06/2006 ExitCare Patient Information 2014 ExitCare, [place].   ________________________________________________________________________  WHAT IS A BLOOD TRANSFUSION? Blood Transfusion Information  A transfusion is the replacement of blood or some of its parts. Blood is made up of multiple cells which provide different functions.  Red blood cells carry oxygen and are used for blood loss replacement.  White blood cells fight against infection.  Platelets control bleeding.  Plasma helps clot blood.  Other blood products are available for specialized needs, such as hemophilia or other clotting disorders. BEFORE THE TRANSFUSION  Who gives blood for transfusions?   Healthy volunteers who are fully evaluated to make sure their blood is safe. This is blood bank  blood. Transfusion therapy is the safest it has ever been in the practice of medicine. Before blood is taken from a donor, a complete history is taken to make sure that person has no history of diseases nor engages in risky social behavior (examples are intravenous drug use or sexual activity with multiple partners). The donor's travel history is screened to minimize risk of transmitting infections, such as malaria. The donated blood is tested for signs of infectious diseases, such as HIV and hepatitis. The blood is then tested to be sure it is compatible with you in order to minimize the chance of a transfusion reaction. If you or a relative donates blood, this is often done in anticipation of surgery and is not appropriate for emergency situations. It takes many days to process the donated blood. RISKS AND COMPLICATIONS Although transfusion therapy is very safe and saves many lives, the main dangers of transfusion include:   Getting an infectious disease.  Developing a transfusion reaction. This is an allergic reaction to something in the blood you were given. Every precaution is taken to prevent this. The decision to have a blood transfusion has been considered carefully by your caregiver before blood is given. Blood is not given unless the benefits outweigh the risks. AFTER THE TRANSFUSION  Right after receiving a blood transfusion, you will usually feel much better and more energetic. This is especially true if your red blood cells have gotten low (anemic). The transfusion raises the level of the red blood cells which carry oxygen, and this usually causes an energy increase.  The nurse administering the transfusion will monitor you carefully for complications. HOME CARE INSTRUCTIONS  No special instructions are needed after a transfusion. You may find your energy is better. Speak with your caregiver about any limitations on activity for underlying diseases you may have. SEEK MEDICAL CARE IF:    Your condition is not improving after your transfusion.  You develop redness or irritation at the intravenous (IV) site. SEEK IMMEDIATE MEDICAL CARE IF:  Any of the following symptoms occur over the next 12 hours:  Shaking chills.  You have a temperature by mouth above 102 F (38.9 C), not controlled by medicine.  Chest, back, or muscle pain.  People around you feel you are not acting correctly or are confused.  Shortness of breath or difficulty breathing.  Dizziness and fainting.  You get a rash or develop hives.  You have a decrease in urine output.  Your urine turns a dark color or changes to pink, red, or brown. Any of the following symptoms occur over the next 10 days:  You have a temperature by mouth above 102 F (38.9 C), not controlled by medicine.  Shortness of breath.  Weakness after normal activity.  The white part of the eye turns yellow (jaundice).  You have a decrease in the amount of urine or are urinating less often.  Your urine turns a dark color or changes to pink, red, or brown. Document Released: 02/21/2000 Document Revised: 05/18/2011 Document Reviewed: 10/10/2007 [facility] Patient Information 2014 [place], [place].  _______________________________________________________________________

## 2018-10-19 NOTE — Progress Notes (Signed)
EKG 09-23-18 EPIC

## 2018-10-20 ENCOUNTER — Encounter (HOSPITAL_COMMUNITY): Payer: Self-pay

## 2018-10-20 ENCOUNTER — Other Ambulatory Visit: Payer: Self-pay

## 2018-10-20 ENCOUNTER — Encounter (HOSPITAL_COMMUNITY)
Admission: RE | Admit: 2018-10-20 | Discharge: 2018-10-20 | Disposition: A | Discharge status: Home / Self Care | Payer: Medicare Other | Source: Ambulatory Visit | Attending: Gynecologic Oncology | Admitting: Gynecologic Oncology

## 2018-10-20 DIAGNOSIS — Z20828 Contact with and (suspected) exposure to other viral communicable diseases: Secondary | ICD-10-CM | POA: Diagnosis not present

## 2018-10-20 DIAGNOSIS — Z01812 Encounter for preprocedural laboratory examination: Secondary | ICD-10-CM | POA: Diagnosis not present

## 2018-10-20 DIAGNOSIS — C519 Malignant neoplasm of vulva, unspecified: Secondary | ICD-10-CM | POA: Insufficient documentation

## 2018-10-20 HISTORY — DX: Other specified postprocedural states: R11.2

## 2018-10-20 HISTORY — DX: Pneumonia, unspecified organism: J18.9

## 2018-10-20 HISTORY — DX: Other specified postprocedural states: Z98.890

## 2018-10-20 LAB — CBC
HCT: 45.5 % (ref 36.0–46.0)
Hemoglobin: 14.3 g/dL (ref 12.0–15.0)
MCH: 28.9 pg (ref 26.0–34.0)
MCHC: 31.4 g/dL (ref 30.0–36.0)
MCV: 92.1 fL (ref 80.0–100.0)
Platelets: 150 10*3/uL (ref 150–400)
RBC: 4.94 MIL/uL (ref 3.87–5.11)
RDW: 14 % (ref 11.5–15.5)
WBC: 5.6 10*3/uL (ref 4.0–10.5)
nRBC: 0 % (ref 0.0–0.2)

## 2018-10-20 LAB — BASIC METABOLIC PANEL
Anion gap: 10 (ref 5–15)
BUN: 13 mg/dL (ref 8–23)
CO2: 23 mmol/L (ref 22–32)
Calcium: 10.3 mg/dL (ref 8.9–10.3)
Chloride: 105 mmol/L (ref 98–111)
Creatinine, Ser: 0.81 mg/dL (ref 0.44–1.00)
GFR calc Af Amer: >60 mL/min (ref >60)
GFR calc non Af Amer: >60 mL/min (ref >60)
Glucose, Bld: 101 mg/dL — ABNORMAL HIGH (ref 70–99)
Potassium: 3.8 mmol/L (ref 3.5–5.1)
Sodium: 138 mmol/L (ref 135–145)

## 2018-10-21 ENCOUNTER — Other Ambulatory Visit (HOSPITAL_COMMUNITY)
Admission: RE | Admit: 2018-10-21 | Discharge: 2018-10-21 | Disposition: A | Discharge status: Home / Self Care | Payer: Medicare Other | Source: Ambulatory Visit | Attending: Gynecologic Oncology | Admitting: Gynecologic Oncology

## 2018-10-21 DIAGNOSIS — Z01812 Encounter for preprocedural laboratory examination: Secondary | ICD-10-CM | POA: Diagnosis not present

## 2018-10-21 LAB — SARS CORONAVIRUS 2 (TAT 6-24 HRS): SARS Coronavirus 2: NEGATIVE

## 2018-10-24 ENCOUNTER — Telehealth: Payer: Self-pay | Admitting: *Deleted

## 2018-10-24 ENCOUNTER — Telehealth: Payer: Self-pay

## 2018-10-24 NOTE — Telephone Encounter (Signed)
I spoke with [name] this am to make sure she was set for surgery tomorrow, 10/25/2018.  She stated that she was.

## 2018-10-24 NOTE — Progress Notes (Signed)
Spoke with patient by phone npo after midnight arrive 1000 am [facility] admitting 10-25-18

## 2018-10-24 NOTE — Telephone Encounter (Signed)
Patient called to check her surgery time. Patient stated "they told me to arrive at 10 am." I explained that her surgery would be at 12:15 pm

## 2018-10-24 NOTE — Anesthesia Preprocedure Evaluation (Addendum)
Anesthesia Evaluation  Patient identified by MRN, date of birth, ID band Patient awake    Reviewed: Allergy & Precautions, NPO status , Patient's Chart, lab work & pertinent test results  History of Anesthesia Complications (+) PONV  Airway Mallampati: II  TM Distance: >3 FB Neck ROM: Full    Dental no notable dental hx. (+) Teeth Intact   Pulmonary former smoker,    Pulmonary exam normal breath sounds clear to auscultation       Cardiovascular Exercise Tolerance: Good hypertension, Pt. on medications negative cardio ROS Normal cardiovascular exam Rhythm:Regular Rate:Normal     Neuro/Psych negative neurological ROS  negative psych ROS   GI/Hepatic Neg liver ROS,   Endo/Other  Hypothyroidism   Renal/GU negative Renal ROS     Musculoskeletal   Abdominal   Peds  Hematology   Anesthesia Other Findings Hx of vulvar CA  Reproductive/Obstetrics                            Lab Results  Component Value Date   WBC 5.6 10/20/2018   HGB 14.3 10/20/2018   HCT 45.5 10/20/2018   MCV 92.1 10/20/2018   PLT 150 10/20/2018   Lab Results  Component Value Date   CREATININE 0.81 10/20/2018   BUN 13 10/20/2018   NA 138 10/20/2018   K 3.8 10/20/2018   CL 105 10/20/2018   CO2 23 10/20/2018     Anesthesia Physical Anesthesia Plan  ASA: III  Anesthesia Plan: General   Post-op Pain Management:    Induction: Intravenous  PONV Risk Score and Plan: 4 or greater and Treatment may vary due to age or medical condition, Dexamethasone, Ondansetron, TIVA and Midazolam  Airway Management Planned: LMA  Additional Equipment:   Intra-op Plan:   Post-operative Plan:   Informed Consent: I have reviewed the patients History and Physical, chart, labs and discussed the procedure including the risks, benefits and alternatives for the proposed anesthesia with the patient or authorized representative who  has indicated his/her understanding and acceptance.     Dental advisory given  Plan Discussed with: CRNA  Anesthesia Plan Comments:        Anesthesia Quick Evaluation

## 2018-10-25 ENCOUNTER — Ambulatory Visit (HOSPITAL_COMMUNITY): Payer: Medicare Other | Admitting: Certified Registered Nurse Anesthetist

## 2018-10-25 ENCOUNTER — Ambulatory Visit (HOSPITAL_COMMUNITY)
Admission: RE | Admit: 2018-10-25 | Discharge: 2018-10-26 | Disposition: A | Discharge status: Home / Self Care | Payer: Medicare Other | Attending: Gynecologic Oncology | Admitting: Gynecologic Oncology

## 2018-10-25 ENCOUNTER — Other Ambulatory Visit: Payer: Self-pay

## 2018-10-25 ENCOUNTER — Encounter (HOSPITAL_COMMUNITY): Payer: Self-pay | Admitting: *Deleted

## 2018-10-25 ENCOUNTER — Encounter (HOSPITAL_COMMUNITY)
Admission: RE | Disposition: A | Discharge status: Home / Self Care | Payer: Self-pay | Source: Home / Self Care | Attending: Gynecologic Oncology

## 2018-10-25 ENCOUNTER — Ambulatory Visit (HOSPITAL_COMMUNITY): Payer: Medicare Other | Admitting: Physician Assistant

## 2018-10-25 DIAGNOSIS — Z87891 Personal history of nicotine dependence: Secondary | ICD-10-CM | POA: Diagnosis not present

## 2018-10-25 DIAGNOSIS — I10 Essential (primary) hypertension: Secondary | ICD-10-CM | POA: Diagnosis not present

## 2018-10-25 DIAGNOSIS — Z7982 Long term (current) use of aspirin: Secondary | ICD-10-CM | POA: Diagnosis not present

## 2018-10-25 DIAGNOSIS — Z888 Allergy status to other drugs, medicaments and biological substances status: Secondary | ICD-10-CM | POA: Insufficient documentation

## 2018-10-25 DIAGNOSIS — Z808 Family history of malignant neoplasm of other organs or systems: Secondary | ICD-10-CM | POA: Insufficient documentation

## 2018-10-25 DIAGNOSIS — Z79899 Other long term (current) drug therapy: Secondary | ICD-10-CM | POA: Insufficient documentation

## 2018-10-25 DIAGNOSIS — Z8541 Personal history of malignant neoplasm of cervix uteri: Secondary | ICD-10-CM | POA: Insufficient documentation

## 2018-10-25 DIAGNOSIS — Z90710 Acquired absence of both cervix and uterus: Secondary | ICD-10-CM | POA: Insufficient documentation

## 2018-10-25 DIAGNOSIS — Z885 Allergy status to narcotic agent status: Secondary | ICD-10-CM | POA: Insufficient documentation

## 2018-10-25 DIAGNOSIS — E039 Hypothyroidism, unspecified: Secondary | ICD-10-CM | POA: Diagnosis not present

## 2018-10-25 DIAGNOSIS — C519 Malignant neoplasm of vulva, unspecified: Secondary | ICD-10-CM | POA: Diagnosis present

## 2018-10-25 HISTORY — PX: LYMPH NODE DISSECTION: SHX5087

## 2018-10-25 LAB — TYPE AND SCREEN
ABO/RH(D): O POS
Antibody Screen: NEGATIVE

## 2018-10-25 SURGERY — LYMPH NODE DISSECTION
Anesthesia: General | Laterality: Bilateral

## 2018-10-25 MED ORDER — LIDOCAINE 2% (20 MG/ML) 5 ML SYRINGE
INTRAMUSCULAR | Status: DC | PRN
  Administered 2018-10-25: 1.5 mg/kg/h via INTRAVENOUS

## 2018-10-25 MED ORDER — EPHEDRINE 5 MG/ML INJ
INTRAVENOUS | Status: AC
  Filled 2018-10-25: qty 10

## 2018-10-25 MED ORDER — KCL IN DEXTROSE-NACL 20-5-0.45 MEQ/L-%-% IV SOLN
INTRAVENOUS | Status: DC
  Administered 2018-10-25: 17:00:00 via INTRAVENOUS
  Filled 2018-10-25: qty 1000

## 2018-10-25 MED ORDER — CEFAZOLIN SODIUM-DEXTROSE 2-4 GM/100ML-% IV SOLN
2.0000 g | INTRAVENOUS | Status: AC
  Administered 2018-10-25: 2 g via INTRAVENOUS
  Filled 2018-10-25: qty 100

## 2018-10-25 MED ORDER — ONDANSETRON HCL 4 MG/2ML IJ SOLN: 4.0000 mg | Freq: Once | INTRAMUSCULAR | Status: DC | PRN

## 2018-10-25 MED ORDER — FENTANYL CITRATE (PF) 250 MCG/5ML IJ SOLN
INTRAMUSCULAR | Status: AC
  Filled 2018-10-25: qty 5

## 2018-10-25 MED ORDER — LACTATED RINGERS IV SOLN
INTRAVENOUS | Status: DC
  Administered 2018-10-25 (×2): via INTRAVENOUS

## 2018-10-25 MED ORDER — DEXAMETHASONE SODIUM PHOSPHATE 10 MG/ML IJ SOLN
INTRAMUSCULAR | Status: AC
  Filled 2018-10-25: qty 1

## 2018-10-25 MED ORDER — PHENYLEPHRINE 40 MCG/ML (10ML) SYRINGE FOR IV PUSH (FOR BLOOD PRESSURE SUPPORT)
PREFILLED_SYRINGE | INTRAVENOUS | Status: AC
  Filled 2018-10-25: qty 10

## 2018-10-25 MED ORDER — FENTANYL CITRATE (PF) 100 MCG/2ML IJ SOLN
INTRAMUSCULAR | Status: DC | PRN
  Administered 2018-10-25 (×3): 50 ug via INTRAVENOUS

## 2018-10-25 MED ORDER — ENOXAPARIN SODIUM 40 MG/0.4ML ~~LOC~~ SOLN
40.0000 mg | SUBCUTANEOUS | Status: AC
  Administered 2018-10-25: 11:00:00 40 mg via SUBCUTANEOUS
  Filled 2018-10-25: qty 0.4

## 2018-10-25 MED ORDER — GABAPENTIN 300 MG PO CAPS
300.0000 mg | ORAL_CAPSULE | ORAL | Status: AC
  Administered 2018-10-25: 11:00:00 300 mg via ORAL
  Filled 2018-10-25: qty 1

## 2018-10-25 MED ORDER — PROPOFOL 10 MG/ML IV BOLUS
INTRAVENOUS | Status: DC | PRN
  Administered 2018-10-25: 100 mg via INTRAVENOUS

## 2018-10-25 MED ORDER — SODIUM CHLORIDE (PF) 0.9 % IJ SOLN
INTRAMUSCULAR | Status: AC
  Filled 2018-10-25: qty 20

## 2018-10-25 MED ORDER — LIDOCAINE HCL (CARDIAC) PF 100 MG/5ML IV SOSY
PREFILLED_SYRINGE | INTRAVENOUS | Status: DC | PRN
  Administered 2018-10-25: 100 mg via INTRAVENOUS

## 2018-10-25 MED ORDER — SUCCINYLCHOLINE CHLORIDE 200 MG/10ML IV SOSY
PREFILLED_SYRINGE | INTRAVENOUS | Status: AC
  Filled 2018-10-25: qty 10

## 2018-10-25 MED ORDER — ALBUTEROL SULFATE (2.5 MG/3ML) 0.083% IN NEBU: 2.5000 mg | INHALATION_SOLUTION | Freq: Four times a day (QID) | RESPIRATORY_TRACT | Status: DC | PRN

## 2018-10-25 MED ORDER — ALBUTEROL SULFATE HFA 108 (90 BASE) MCG/ACT IN AERS: 2.0000 | INHALATION_SPRAY | Freq: Four times a day (QID) | RESPIRATORY_TRACT | Status: DC | PRN

## 2018-10-25 MED ORDER — 0.9 % SODIUM CHLORIDE (POUR BTL) OPTIME
TOPICAL | Status: DC | PRN
  Administered 2018-10-25: 12:00:00 1000 mL

## 2018-10-25 MED ORDER — ROCURONIUM BROMIDE 10 MG/ML (PF) SYRINGE
PREFILLED_SYRINGE | INTRAVENOUS | Status: AC
  Filled 2018-10-25: qty 10

## 2018-10-25 MED ORDER — ACETAMINOPHEN 500 MG PO TABS
1000.0000 mg | ORAL_TABLET | Freq: Two times a day (BID) | ORAL | Status: DC
  Administered 2018-10-26: 02:00:00 1000 mg via ORAL
  Filled 2018-10-25: qty 2

## 2018-10-25 MED ORDER — CELECOXIB 200 MG PO CAPS
400.0000 mg | ORAL_CAPSULE | ORAL | Status: AC
  Administered 2018-10-25: 11:00:00 400 mg via ORAL
  Filled 2018-10-25: qty 2

## 2018-10-25 MED ORDER — LEVOTHYROXINE SODIUM 75 MCG PO TABS
75.0000 ug | ORAL_TABLET | Freq: Every day | ORAL | Status: DC
  Administered 2018-10-26: 05:00:00 75 ug via ORAL
  Filled 2018-10-25: qty 1

## 2018-10-25 MED ORDER — SODIUM CHLORIDE (PF) 0.9 % IJ SOLN
INTRAMUSCULAR | Status: AC
  Filled 2018-10-25: qty 50

## 2018-10-25 MED ORDER — ASPIRIN 81 MG PO CHEW
81.0000 mg | CHEWABLE_TABLET | Freq: Every day | ORAL | Status: DC
  Administered 2018-10-26: 09:00:00 81 mg via ORAL
  Filled 2018-10-25: qty 1

## 2018-10-25 MED ORDER — ONDANSETRON HCL 4 MG/2ML IJ SOLN
INTRAMUSCULAR | Status: AC
  Filled 2018-10-25: qty 2

## 2018-10-25 MED ORDER — ONDANSETRON HCL 4 MG PO TABS
4.0000 mg | ORAL_TABLET | Freq: Four times a day (QID) | ORAL | Status: DC | PRN
  Administered 2018-10-26: 4 mg via ORAL
  Filled 2018-10-25: qty 1

## 2018-10-25 MED ORDER — DEXAMETHASONE SODIUM PHOSPHATE 4 MG/ML IJ SOLN
4.0000 mg | INTRAMUSCULAR | Status: AC
  Administered 2018-10-25: 10 mg via INTRAVENOUS

## 2018-10-25 MED ORDER — FENTANYL CITRATE (PF) 100 MCG/2ML IJ SOLN
INTRAMUSCULAR | Status: AC
  Filled 2018-10-25: qty 2

## 2018-10-25 MED ORDER — ONDANSETRON HCL 4 MG/2ML IJ SOLN
4.0000 mg | Freq: Four times a day (QID) | INTRAMUSCULAR | Status: DC | PRN
  Administered 2018-10-25: 17:00:00 4 mg via INTRAVENOUS
  Filled 2018-10-25: qty 2

## 2018-10-25 MED ORDER — ROSUVASTATIN CALCIUM 5 MG PO TABS
5.0000 mg | ORAL_TABLET | Freq: Every day | ORAL | Status: DC
  Administered 2018-10-25: 22:00:00 5 mg via ORAL
  Filled 2018-10-25: qty 1

## 2018-10-25 MED ORDER — PROPOFOL 500 MG/50ML IV EMUL
INTRAVENOUS | Status: DC | PRN
  Administered 2018-10-25: 150 ug/kg/min via INTRAVENOUS

## 2018-10-25 MED ORDER — ONDANSETRON HCL 4 MG/2ML IJ SOLN
INTRAMUSCULAR | Status: DC | PRN
  Administered 2018-10-25: 4 mg via INTRAVENOUS

## 2018-10-25 MED ORDER — ACETAMINOPHEN 500 MG PO TABS
1000.0000 mg | ORAL_TABLET | ORAL | Status: AC
  Administered 2018-10-25: 11:00:00 1000 mg via ORAL
  Filled 2018-10-25: qty 2

## 2018-10-25 MED ORDER — ROCURONIUM BROMIDE 100 MG/10ML IV SOLN
INTRAVENOUS | Status: DC | PRN
  Administered 2018-10-25: 50 mg via INTRAVENOUS

## 2018-10-25 MED ORDER — ACETAMINOPHEN 10 MG/ML IV SOLN: 1000.0000 mg | Freq: Once | INTRAVENOUS | Status: DC | PRN

## 2018-10-25 MED ORDER — BUPIVACAINE HCL (PF) 0.25 % IJ SOLN
INTRAMUSCULAR | Status: AC
  Filled 2018-10-25: qty 30

## 2018-10-25 MED ORDER — TRAMADOL HCL 50 MG PO TABS: 100.0000 mg | ORAL_TABLET | Freq: Two times a day (BID) | ORAL | Status: DC | PRN

## 2018-10-25 MED ORDER — FENTANYL CITRATE (PF) 100 MCG/2ML IJ SOLN
25.0000 ug | INTRAMUSCULAR | Status: DC | PRN
  Administered 2018-10-25: 15:00:00 50 ug via INTRAVENOUS

## 2018-10-25 MED ORDER — BUPIVACAINE LIPOSOME 1.3 % IJ SUSP
20.0000 mL | Freq: Once | INTRAMUSCULAR | Status: AC
  Administered 2018-10-25: 13:00:00 20 mL
  Filled 2018-10-25: qty 20

## 2018-10-25 MED ORDER — BUPIVACAINE-EPINEPHRINE 0.5% -1:200000 IJ SOLN
INTRAMUSCULAR | Status: DC | PRN
  Administered 2018-10-25: 20 mL

## 2018-10-25 MED ORDER — IBUPROFEN 200 MG PO TABS
400.0000 mg | ORAL_TABLET | Freq: Four times a day (QID) | ORAL | Status: DC
  Administered 2018-10-25 – 2018-10-26 (×4): 400 mg via ORAL
  Filled 2018-10-25 (×4): qty 2

## 2018-10-25 MED ORDER — PROPOFOL 10 MG/ML IV BOLUS
INTRAVENOUS | Status: AC
  Filled 2018-10-25: qty 20

## 2018-10-25 MED ORDER — LIDOCAINE 2% (20 MG/ML) 5 ML SYRINGE
INTRAMUSCULAR | Status: AC
  Filled 2018-10-25: qty 5

## 2018-10-25 MED ORDER — SUGAMMADEX SODIUM 200 MG/2ML IV SOLN
INTRAVENOUS | Status: DC | PRN
  Administered 2018-10-25: 200 mg via INTRAVENOUS

## 2018-10-25 SURGICAL SUPPLY — 37 items
ADH SKN CLS APL DERMABOND .7 (GAUZE/BANDAGES/DRESSINGS) ×1
BINDER ABDOMINAL 12 ML 46-62 (SOFTGOODS) IMPLANT
BLADE HEX COATED 2.75 (ELECTRODE) ×2 IMPLANT
COVER SURGICAL LIGHT HANDLE (MISCELLANEOUS) ×2 IMPLANT
COVER WAND RF STERILE (DRAPES) IMPLANT
DECANTER SPIKE VIAL GLASS SM (MISCELLANEOUS) ×1 IMPLANT
DERMABOND ADVANCED (GAUZE/BANDAGES/DRESSINGS) ×1
DERMABOND ADVANCED .7 DNX12 (GAUZE/BANDAGES/DRESSINGS) ×1 IMPLANT
DRAIN CHANNEL 15F RND FF 3/16 (WOUND CARE) ×2 IMPLANT
DRSG PAD ABDOMINAL 8X10 ST (GAUZE/BANDAGES/DRESSINGS) IMPLANT
DRSG TEGADERM 4X4.75 (GAUZE/BANDAGES/DRESSINGS) ×4 IMPLANT
ELECT REM PT RETURN 15FT ADLT (MISCELLANEOUS) ×2 IMPLANT
EVACUATOR SILICONE 100CC (DRAIN) ×4 IMPLANT
GAUZE 4X4 16PLY RFD (DISPOSABLE) ×1 IMPLANT
GAUZE SPONGE 4X4 12PLY STRL (GAUZE/BANDAGES/DRESSINGS) IMPLANT
GLOVE BIO SURGEON STRL SZ 6.5 (GLOVE) ×4 IMPLANT
GLOVE BIOGEL PI IND STRL 7.0 (GLOVE) ×1 IMPLANT
GLOVE BIOGEL PI INDICATOR 7.0 (GLOVE) ×1
KIT BASIN OR (CUSTOM PROCEDURE TRAY) ×2 IMPLANT
KIT TURNOVER KIT A (KITS) ×2 IMPLANT
NDL HYPO 25X1 1.5 SAFETY (NEEDLE) IMPLANT
NEEDLE HYPO 22GX1.5 SAFETY (NEEDLE) ×4 IMPLANT
NEEDLE HYPO 25X1 1.5 SAFETY (NEEDLE) IMPLANT
NS IRRIG 1000ML POUR BTL (IV SOLUTION) ×2 IMPLANT
PACK GENERAL/GYN (CUSTOM PROCEDURE TRAY) ×2 IMPLANT
SHEARS HARMONIC STRL 23CM (MISCELLANEOUS) ×2 IMPLANT
SHEET LAVH (DRAPES) ×1 IMPLANT
SPONGE LAP 4X18 RFD (DISPOSABLE) IMPLANT
STAPLER VISISTAT 35W (STAPLE) ×1 IMPLANT
SUT ETHILON 2 0 PS N (SUTURE) ×2 IMPLANT
SUT VIC AB 2-0 SH 27 (SUTURE) ×4
SUT VIC AB 2-0 SH 27X BRD (SUTURE) IMPLANT
SUT VIC AB 4-0 PS2 18 (SUTURE) ×2 IMPLANT
SYR 20ML LL LF (SYRINGE) ×2 IMPLANT
SYR 27GX1/2 1ML LL SAFETY (SYRINGE) ×1 IMPLANT
SYR CONTROL 10ML LL (SYRINGE) ×1 IMPLANT
TRAY FOLEY MTR SLVR 16FR STAT (SET/KITS/TRAYS/PACK) ×1 IMPLANT

## 2018-10-25 NOTE — Interval H&P Note (Signed)
History and Physical Interval Note:  10/25/2018 11:46 AM  [name]  has presented today for surgery, with the diagnosis of VULVAR CANCER.  The various methods of treatment have been discussed with the patient and family. After consideration of risks, benefits and other options for treatment, the patient has consented to  Procedure(s): LYMPH NODE DISSECTION,BILATERAL INGUINOFEMORAL LYMPHADECTOMY (Bilateral) as a surgical intervention.  The patient's history has been reviewed, patient examined, no change in status, stable for surgery.  I have reviewed the patient's chart and labs.  Questions were answered to the patient's satisfaction.     Judith [place] A.

## 2018-10-25 NOTE — Op Note (Signed)
PATIENT: Robinn Overholt DATE OF BIRTH: 1941/03/09 ENCOUNTER DATE: 10/25/2018   Preop Diagnosis: Stage I vulvar cancer  Postoperative Diagnosis: Same  Surgery: Bilateral superficial inguinal lymphadenectomy.  Surgeons:  Lucita Lora. Alycia Rossetti, MD; Lahoma Crocker, MD   Anesthesia: General   Estimated blood loss: 20 ml  IVF: 700 ml   Urine output: 30  ml   Complications: None   Pathology: Bilateral superficial inguinal nodes.  Operative findings: Well-healing vulva incision from prior surgery.  Some minimal separation as the sutures are dissolving.  No obvious groin lymphadenopathy.  Procedure: The patient was identified in the preoperative holding area. Informed consent was signed on the chart. Patient was seen history was reviewed and exam was performed.   The patient was then taken to the operating room and placed in the supine position with SCD hose on. General anesthesia was then induced without difficulty. She was then placed in the dorsolithotomy position. The lower abdomen and anterior thighs were prepped with chlor prep sponges per protocol. Perineum was prepped with Betadine. A Foley catheter was inserted into the bladder under sterile conditions.   The patient was then draped after the prep was dried. Timeout was performed the patient, procedure, antibiotic, allergy, and length of procedure.  The pubic tubercle and ACIS were marked.  The femoral triangle on the left was identified and a 9 cm incision was made parallel to the inguinal ligament approximately 2 cm below the ligament.  The subcutaneous tissues were separated using pinpoint cautery and Bovie cautery.  The camper's fascia was divided and the skin flaps are elevated with sharp dissection with ligation using the harmonic scalpel of the vessels were necessary.  The lymph node bundles were mobilized by incising the loose areolar tissue attachments to the fascia of the rectus abdominis.  This was carried down to the level of the  cribriform fascia.  As only superficial inguinal lymphadenectomy was performed, we did not divide the fascia around the sartorius muscle or the cribriform fascia.  After the specimen was removed and the area was noted to be hemostatic.  A stab wound was made in the lateral aspect of the incision.  A #8 Blake drain was placed into the nodal cavity.  The drain was sutured with a #1 nylon.  The subcu and deep pockets were irrigated.  Camper's fascia was closed using running suture of 2-0 Vicryl.  Exparel of 10 mL of Exparel, 15 mL of quarter percent plain Marcaine and 10 mL's of saline was injected for postoperative pain control.  The skin was closed using a 4-0 Vicryl in a running subcu fashion.  The same procedure was performed on the patient's right side. All instrument, suture, laparotomy, Ray-Tec, and needle counts were correct x2. The patient tolerated the procedure well and was taken recovery room in stable condition. This is [name] dictating an operative note on Julanne Schlueter.

## 2018-10-25 NOTE — Anesthesia Postprocedure Evaluation (Signed)
Anesthesia Post Note  Patient: [name]  Procedure(s) Performed: LYMPH NODE DISSECTION,BILATERAL INGUINOFEMORAL LYMPHADECTOMY (Bilateral )     Patient location during evaluation: PACU Anesthesia Type: General Level of consciousness: awake and alert Pain management: pain level controlled Vital Signs Assessment: post-procedure vital signs reviewed and stable Respiratory status: spontaneous breathing, nonlabored ventilation, respiratory function stable and patient connected to nasal cannula oxygen Cardiovascular status: blood pressure returned to baseline and stable Postop Assessment: no apparent nausea or vomiting Anesthetic complications: no    Last Vitals:  Vitals:   10/25/18 1540 10/25/18 1553  BP: 108/67 115/63  Pulse: 60 62  Resp: 12 14  Temp:  (!) 36.4 C  SpO2: 96% 99%    Last Pain:  Vitals:   10/25/18 1553  TempSrc: Oral  PainSc: 0-No pain                 Barnet Glasgow

## 2018-10-25 NOTE — Anesthesia Procedure Notes (Addendum)
Procedure Name: Intubation Date/Time: 10/25/2018 12:38 PM Performed by: West Pugh, CRNA Pre-anesthesia Checklist: Patient identified, Emergency Drugs available, Suction available, Patient being monitored and Timeout performed Patient Re-evaluated:Patient Re-evaluated prior to induction Oxygen Delivery Method: Circle system utilized Preoxygenation: Pre-oxygenation with 100% oxygen Induction Type: IV induction and Cricoid Pressure applied Ventilation: Mask ventilation without difficulty Laryngoscope Size: Miller and 3 Grade View: Grade II Tube size: 7.0 mm Number of attempts: 3 Airway Equipment and Method: Bougie stylet Placement Confirmation: ETT inserted through vocal cords under direct vision,  positive ETCO2,  CO2 detector and breath sounds checked- equal and bilateral Difficulty Due To: Difficulty was anticipated Future Recommendations: Recommend- induction with short-acting agent, and alternative techniques readily available Comments: Attempted x 3. SRNA attempted without success using glidescope, CRNA(L.A.) without success using glidescope.. DL attempted by Dr Valma Cava with miller 3 and bougie stylet. Patient intubated successfully

## 2018-10-25 NOTE — Transfer of Care (Signed)
Immediate Anesthesia Transfer of Care Note  Patient: [name]  Procedure(s) Performed: LYMPH NODE DISSECTION,BILATERAL INGUINOFEMORAL LYMPHADECTOMY (Bilateral )  Patient Location: PACU  Anesthesia Type:General  Level of Consciousness: awake and patient cooperative  Airway & Oxygen Therapy: Patient Spontanous Breathing and Patient connected to face mask  Post-op Assessment: Report given to RN and Post -op Vital signs reviewed and stable  Post vital signs: Reviewed and stable  Last Vitals:  Vitals Value Taken Time  BP 107/73 10/25/18 1416  Temp    Pulse 57 10/25/18 1417  Resp 27 10/25/18 1417  SpO2 97 % 10/25/18 1417  Vitals shown include unvalidated device data.  Last Pain:  Vitals:   10/25/18 1019  TempSrc: Oral         Complications: No apparent anesthesia complications

## 2018-10-26 ENCOUNTER — Encounter (HOSPITAL_COMMUNITY): Payer: Self-pay | Admitting: Gynecologic Oncology

## 2018-10-26 DIAGNOSIS — C519 Malignant neoplasm of vulva, unspecified: Secondary | ICD-10-CM | POA: Diagnosis not present

## 2018-10-26 LAB — BASIC METABOLIC PANEL
Anion gap: 5 (ref 5–15)
BUN: 14 mg/dL (ref 8–23)
CO2: 24 mmol/L (ref 22–32)
Calcium: 9.8 mg/dL (ref 8.9–10.3)
Chloride: 107 mmol/L (ref 98–111)
Creatinine, Ser: 0.94 mg/dL (ref 0.44–1.00)
GFR calc Af Amer: >60 mL/min (ref >60)
GFR calc non Af Amer: 59 mL/min — ABNORMAL LOW (ref >60)
Glucose, Bld: 145 mg/dL — ABNORMAL HIGH (ref 70–99)
Potassium: 4.4 mmol/L (ref 3.5–5.1)
Sodium: 136 mmol/L (ref 135–145)

## 2018-10-26 LAB — CBC
HCT: 39.4 % (ref 36.0–46.0)
Hemoglobin: 12.4 g/dL (ref 12.0–15.0)
MCH: 29 pg (ref 26.0–34.0)
MCHC: 31.5 g/dL (ref 30.0–36.0)
MCV: 92.1 fL (ref 80.0–100.0)
Platelets: 144 10*3/uL — ABNORMAL LOW (ref 150–400)
RBC: 4.28 MIL/uL (ref 3.87–5.11)
RDW: 13.7 % (ref 11.5–15.5)
WBC: 7.2 10*3/uL (ref 4.0–10.5)
nRBC: 0 % (ref 0.0–0.2)

## 2018-10-26 NOTE — Discharge Summary (Signed)
Physician Discharge Summary  Patient ID: [name] MRN: [id number] DOB/AGE: [birth year] 78 y.o.  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    Hospital Course: On 10/25/2018, the patient underwent the following: Procedure(s): LYMPH NODE DISSECTION, BILATERAL INGUINOFEMORAL LYMPHADECTOMY. The postoperative course was uneventful.  She was discharged to home on postoperative day 1 tolerating a regular diet, voiding, ambulating, minimal pain reported. She was instructed on drain care and given supplies.   Consults: None  Significant Diagnostic Studies: None  Treatments: surgery: see above  Discharge Exam: Blood pressure (!) 105/54, pulse 72, temperature 98 F (36.7 C), temperature source Oral, resp. rate 17, height 5\' 5"  (1.651 m), weight 156 lb (70.8 kg), SpO2 96 %. General appearance: alert, cooperative and no distress Resp: clear to auscultation bilaterally Cardio: regular rate and rhythm, S1, S2 normal, no murmur, click, rub or gallop GI: soft, non-tender; bowel sounds normal; no masses,  no organomegaly Extremities: extremities normal, atraumatic, no cyanosis or edema Incision/Wound:  Bilateral groin incisions with dermabond intact with no drainage, drains in place and charged with minimal serosanguinous drainage present.  Disposition:    Discharge Instructions    Call MD for:  difficulty breathing, headache or visual disturbances   Complete by: As directed    Call MD for:  extreme fatigue   Complete by: As directed    Call MD for:  hives   Complete by: As directed    Call MD for:  persistant dizziness or light-headedness   Complete by: As directed    Call MD for:  persistant nausea and vomiting   Complete by: As directed    Call MD for:   redness, tenderness, or signs of infection (pain, swelling, redness, odor or green/yellow discharge around incision site)   Complete by: As directed    Call MD for:  severe uncontrolled pain   Complete by: As directed    Call MD for:  temperature >100.4   Complete by: As directed    Diet - low sodium heart healthy   Complete by: As directed    Driving Restrictions   Complete by: As directed    No driving for 2 weeks.  Do not take narcotics and drive.   Increase activity slowly   Complete by: As directed    Lifting restrictions   Complete by: As directed    No lifting greater than 10 lbs.     Allergies as of 10/26/2018      Reactions   Prednisone Other (See Comments)   Feels Dizzy   Codeine Anxiety   "Jumpy inside"      Medication List    STOP taking these medications   sulfamethoxazole-trimethoprim 800-160 MG tablet Commonly known as: BACTRIM DS     TAKE these medications   albuterol 108 (90 Base) MCG/ACT inhaler Commonly known as: VENTOLIN HFA Inhale 2 puffs into the lungs every 6 (six) hours as needed for wheezing or shortness of breath.   aspirin 81 MG chewable tablet Chew 81 mg by mouth daily.   cholecalciferol 25 MCG (1000 UT) tablet Commonly known as: VITAMIN D3 Take 2,000 Units by mouth every evening.   Fish Oil 1200 MG Caps Take 1,200 mg by mouth 5 (five) times daily.   hydrochlorothiazide 25 MG tablet Commonly known as:  HYDRODIURIL Take 12.5 mg by mouth daily.   ibuprofen 200 MG tablet Commonly known as: ADVIL Take 400 mg by mouth every 6 (six) hours as needed for headache or moderate pain.   ibuprofen 600 MG tablet Commonly known as: ADVIL Take 1 tablet (600 mg total) by mouth every 6 (six) hours as needed. For AFTER surgery   levothyroxine 75 MCG tablet Commonly known as: SYNTHROID Take 75 mcg by mouth daily before breakfast.   Oxcarbazepine 300 MG tablet Commonly known as: TRILEPTAL Take 150 mg by mouth 2 (two) times daily as needed  (Trigeminal neuralgia).   rosuvastatin 5 MG tablet Commonly known as: CRESTOR Take 5 mg by mouth at bedtime.   senna-docusate 8.6-50 MG tablet Commonly known as: Senokot-S Take 2 tablets by mouth at bedtime. For AFTER surgery, do not take if having diarrhea   traMADol 50 MG tablet Commonly known as: ULTRAM Take 1 tablet (50 mg total) by mouth every 6 (six) hours as needed. For AFTER surgery, do not take and drive      Follow-up Information    Joylene John D, NP Follow up on 11/02/2018.   Specialty: Gynecologic Oncology Why: at 10:30am for drain check at the [facility] information: [place] [address] [phone]        [name], MD Follow up on 11/08/2018.   Specialty: Gynecologic Oncology Why: at 11:45 am in the [place] information: [address]  [place] [address] [phone]           Greater than thirty minutes were spend for face to face discharge instructions and discharge orders/summary in EPIC.   Signed: Dorothyann Gibbs 10/26/2018, 4:50 PM

## 2018-10-26 NOTE — Discharge Instructions (Signed)
10/26/2018  Empty the drain frequently during the day and record the output on your sheet. Change the dressing around the drain once a day.  Activity: 1. Be up and out of the bed during the day.  Take a nap if needed.  You may walk up steps but be careful and use the hand rail.  Stair climbing will tire you more than you think, you may need to stop part way and rest.   2. No lifting or straining for 6 weeks.  3. No driving for 2 week(s).  Do not drive if you are taking narcotic pain medicine.  4. Shower daily.  Use soap and water on your incision and pat dry; don't rub.  No tub baths until cleared by your surgeon.   5. You may experience a small amount of clear drainage from your incisions, which is normal.  If the drainage persists or increases, please call the office.  6. Take Tylenol or ibuprofen first for pain and only use tramadol for severe pain not relieved by the Tylenol or Ibuprofen.  Monitor your Tylenol intake to a max of 4,000 mg a day.  Diet: 1. Low sodium Heart Healthy Diet is recommended.  2. It is safe to use a laxative, such as Miralax or Colace, if you have difficulty moving your bowels. You can take Sennakot at bedtime every evening to keep bowel movements regular and to prevent constipation.    Wound Care: 1. Keep clean and dry.  Shower daily.  Reasons to call the Doctor:  Fever - Oral temperature greater than 100.4 degrees Fahrenheit  Foul-smelling vaginal discharge  Difficulty urinating  Nausea and vomiting  Increased pain at the site of the incision that is unrelieved with pain medicine.  Difficulty breathing with or without chest pain  New calf pain especially if only on one side  Sudden, continuing increased vaginal bleeding with or without clots.   Contacts: For questions or concerns you should contact:  Dr. [name] at [phone] or [phone]  Joylene John, NP at [phone]  After Hours: call [phone] and have the GYN  Oncologist paged/contacted   Surgical Drain Record Empty your surgical drain as told by your health care provider. Use this form to write down the amount of fluid that has collected in the drainage container. Bring this form with you to your follow-up visits. Surgical drain #1 location: ___________________  Date __________ Time __________ Amount __________ Date __________ Time __________ Amount __________ Date __________ Time __________ Amount __________ Date __________ Time __________ Amount __________ Date __________ Time __________ Amount __________ Date __________ Time __________ Amount __________ Date __________ Time __________ Amount __________ Date __________ Time __________ Amount __________ Date __________ Time __________ Amount __________ Date __________ Time __________ Amount __________ Date __________ Time __________ Amount __________ Date __________ Time __________ Amount __________ Date __________ Time __________ Amount __________ Date __________ Time __________ Amount __________ Date __________ Time __________ Amount __________ Date __________ Time __________ Amount __________ Date __________ Time __________ Amount __________ Date __________ Time __________ Amount __________ Date __________ Time __________ Amount __________ Date __________ Time __________ Amount __________ Date __________ Time __________ Amount __________ Surgical drain #2 location: ___________________ Date __________ Time __________ Amount __________ Date __________ Time __________ Amount __________ Date __________ Time __________ Amount __________ Date __________ Time __________ Amount __________ Date __________ Time __________ Amount __________ Date __________ Time __________ Amount __________ Date __________ Time __________ Amount __________ Date __________ Time __________ Amount __________ Date __________ Time __________ Amount __________ Date __________ Time __________  Amount  __________ Date __________ Time __________ Amount __________ Date __________ Time __________ Amount __________ Date __________ Time __________ Amount __________ Date __________ Time __________ Amount __________ Date __________ Time __________ Amount __________ Date __________ Time __________ Amount __________ Date __________ Time __________ Amount __________ Date __________ Time __________ Amount __________ Date __________ Time __________ Amount __________ Date __________ Time __________ Amount __________ Date __________ Time __________ Amount __________ This information is not intended to replace advice given to you by your health care provider. Make sure you discuss any questions you have with your health care provider. Document Released: 11/30/2016 Document Revised: 11/30/2016 Document Reviewed: 11/30/2016 Elsevier Patient Education  2020 [employer] Surgical drains are used to remove extra fluid that normally builds up in a surgical wound after surgery. A surgical drain helps to heal a surgical wound. Different kinds of surgical drains include:  Active drains. These drains use suction to pull drainage away from the surgical wound. Drainage flows through a tube to a container outside of the body. With these drains, you need to keep the bulb or the drainage container flat (compressed) at all times, except while you empty it. Flattening the bulb or container creates suction.  Passive drains. These drains allow fluid to drain naturally, by gravity. Drainage flows through a tube to a bandage (dressing) or a container outside of the body. Passive drains do not need to be emptied. A drain is placed during surgery. Right after surgery, drainage is usually bright red and a little thicker than water. The drainage may gradually turn yellow or pink and become thinner. It is likely that your health care provider will remove the drain when the drainage stops or when the  amount decreases to 1-2 Tbsp (15-30 mL) during a 24-hour period. Supplies needed:  Tape.  Germ-free cleaning solution (sterile saline).  Cotton swabs.  Split gauze drain sponge: 4 x 4 inches (10 x 10 cm).  Gauze square: 4 x 4 inches (10 x 10 cm). How to care for your surgical drain Care for your drain as told by your health care provider. This is important to help prevent infection. If your drain is placed at your back, or any other hard-to-reach area, ask another person to assist you in performing the following tasks: General care  Keep the skin around the drain dry and covered with a dressing at all times.  Check your drain area every day for signs of infection. Check for: ? Redness, swelling, or pain. ? Pus or a bad smell. ? Cloudy drainage. ? Tenderness or pressure at the drain exit site. Changing the dressing Follow instructions from your health care provider about how to change your dressing. Change your dressing at least once a day. Change it more often if needed to keep the dressing dry. Make sure you: 1. Gather your supplies. 2. Wash your hands with soap and water before you change your dressing. If soap and water are not available, use hand sanitizer. 3. Remove the old dressing. Avoid using scissors to do that. 4. Wash your hands with soap and water again after removing the old dressing. 5. Use sterile saline to clean your skin around the drain. You may need to use a cotton swab to clean the skin. 6. Place the tube through the slit in a drain sponge. Place the drain sponge so that it covers your wound. 7. Place the gauze square or another drain sponge on top of the drain sponge that is on  the wound. Make sure the tube is between those layers. 8. Tape the dressing to your skin. 9. Tape the drainage tube to your skin 1-2 inches (2.5-5 cm) below the place where the tube enters your body. Taping keeps the tube from pulling on any stitches (sutures) that you have. 10. Wash your  hands with soap and water. 11. Write down the color of your drainage and how often you change your dressing. How to empty your active drain  1. Make sure that you have a measuring cup that you can empty your drainage into. 2. Wash your hands with soap and water. If soap and water are not available, use hand sanitizer. 3. Loosen any pins or clips that hold the tube in place. 4. If your health care provider tells you to strip the tube to prevent clots and tube blockages: ? Hold the tube at the skin with one hand. Use your other hand to pinch the tubing with your thumb and first finger. ? Gently move your fingers down the tube while squeezing very lightly. This clears any drainage, clots, or tissue from the tube. ? You may need to do this several times each day to keep the tube clear. Do not pull on the tube. 5. Open the bulb cap or the drain plug. Do not touch the inside of the cap or the bottom of the plug. 6. Turn the device upside down and gently squeeze. 7. Empty all of the drainage into the measuring cup. 8. Compress the bulb or the container and replace the cap or the plug. To compress the bulb or the container, squeeze it firmly in the middle while you close the cap or plug the container. 9. Write down the amount of drainage that you have in each 24-hour period. If you have less than 2 Tbsp (30 mL) of drainage during 24 hours, contact your health care provider. 10. Flush the drainage down the toilet. 11. Wash your hands with soap and water. Contact a health care provider if:  You have redness, swelling, or pain around your drain area.  You have pus or a bad smell coming from your drain area.  You have a fever or chills.  The skin around your drain is warm to the touch.  The amount of drainage that you have is increasing instead of decreasing.  You have drainage that is cloudy.  There is a sudden stop or a sudden decrease in the amount of drainage that you have.  Your drain tube  falls out.  Your active drain does not stay compressed after you empty it. Summary  Surgical drains are used to remove extra fluid that normally builds up in a surgical wound after surgery.  Different kinds of surgical drains include active drains and passive drains. Active drains use suction to pull drainage away from the surgical wound, and passive drains allow fluid to drain naturally.  It is important to care for your drain to prevent infection. If your drain is placed at your back, or any other hard-to-reach area, ask another person to assist you.  Contact your health care provider if you have redness, swelling, or pain around your drain area. This information is not intended to replace advice given to you by your health care provider. Make sure you discuss any questions you have with your health care provider. Document Released: 02/21/2000 Document Revised: 03/30/2018 Document Reviewed: 03/30/2018 Elsevier Patient Education  2020 [employer].

## 2018-10-26 NOTE — Progress Notes (Signed)
Discharge instructions discussed with patient and family, verbalized agreement and understanding. Return demonstration of drain care complete

## 2018-10-27 ENCOUNTER — Telehealth: Payer: Self-pay | Admitting: Oncology

## 2018-10-27 NOTE — Telephone Encounter (Signed)
Called [name] to see how she is feeling since discharge.  She said that she is doing fine.  She denies having pain, nausea or trouble urinating.  She said she emptied the drain frequently yesterday and had about 10 mL out at a time.  She said the drainage is red but not a red as it was.  She hasn't changed the dressing yet today but her daughter is going to help her with soon.  She denies seeing any redness around the dressing and also denies having a fever.  Advised her to call if she has any questions or concerns.

## 2018-11-01 ENCOUNTER — Telehealth: Payer: Self-pay | Admitting: Gynecologic Oncology

## 2018-11-01 NOTE — Telephone Encounter (Signed)
TC to patient's daughter on 8/24. We discussed pathology and that there were no LN found on the tissue bundle from the left. Her mother is having constipation and she will try MoM. Drains only putting out about 5 mL/day. Will keep post-op check on Wednesday. All questions answered. PG

## 2018-11-02 ENCOUNTER — Inpatient Hospital Stay (HOSPITAL_BASED_OUTPATIENT_CLINIC_OR_DEPARTMENT_OTHER): Payer: Medicare Other | Admitting: Gynecologic Oncology

## 2018-11-02 ENCOUNTER — Encounter: Payer: Self-pay | Admitting: Gynecologic Oncology

## 2018-11-02 ENCOUNTER — Other Ambulatory Visit: Payer: Self-pay

## 2018-11-02 VITALS — BP 128/70 | HR 70 | Temp 98.0°F | Resp 18 | Ht 65.0 in | Wt 155.0 lb

## 2018-11-02 DIAGNOSIS — Z9889 Other specified postprocedural states: Secondary | ICD-10-CM

## 2018-11-02 DIAGNOSIS — C519 Malignant neoplasm of vulva, unspecified: Secondary | ICD-10-CM

## 2018-11-02 NOTE — Patient Instructions (Signed)
Doing well from a surgery standpoint.  Plan to keep a dry dressing over the drain sites until they scab over.  You can change the dressing once a day or more if it becomes wet or soiled. Plan to follow up as scheduled or sooner if needed.  Please call the office at [phone] for any questions, concerns, or new symptoms such as swelling in the groin, leg swelling.

## 2018-11-02 NOTE — Progress Notes (Signed)
Follow Up Post-Op Note: Gyn-Onc  [name] 78 y.o. female  CC:  Chief Complaint  Patient presents with  . Post op check, drain removal    follow up    HPI: [name] is a 78 year old female initially seen in consultation at the request of Dr. Modesta Messing for a vulvar mass. She noted a palpable lump in the winter of 2019 and 2020 on the anterior portion of her vulva.  She put off seeing a physician for this because she reported that she was busy.  It became more noticeable in April 2020 and she attempted to have an appointment with a gynecologist however she could not get an appointment due to the the coronavirus shut downs.  She was eventually able to see a physician on August 25, 2018 at which time evaluation of the vulva confirmed a vulvar mass.  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  She was taken to the OR on 09/27/18 with Dr Alycia Rossetti for a radical anterior vulvectomy.  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  The resection margins were negative for carcinoma with the closest being the lateral margin 1:00 at 0.8 cm.  The carcinoma invaded 1.3 cm depth.  There was no lymphovascular or perineural invasion identified.  Postoperatively a PET/CT was obtained on October 06, 2018.  This revealed postoperative changes to the vulva.  There was no hypermetabolic local regional lymphadenopathy.  There is a questionable subtle focus of hypermetabolism in the left lobe of the liver, indeterminate for liver metastases.  MRI abdomen could be performed for further work-up.  No other potential findings of hypermetabolic distant metastatic disease were seen.  On 10/25/2018, she underwent bilateral superficial inguinal lymphadenectomy with Dr. Alycia Rossetti. Final pathology resulted:  1. Lymph nodes, regional resection, left inguinal - FIBROADIPOSE TISSUE. - SEE COMMENT. 2.  Lymph nodes, regional resection, right inguinal - THERE IS NO EVIDENCE OF CARCINOMA IN 2 OF 2 LYMPH NODE (0/2) Microscopic Comment 1. The entire specimen has been submitted for histologic review. No lymph nodal tissue is identified.  Interval History: She presents today for post-op follow up and drain assessment.  She states she has been doing well at home.  Minimal pain reported.  Mild constipation post-op improved after taking milk of magnesia. Her drains have been draining less than 10 cc for the past two days.  She has not needed to take any pain medications. No concerns voiced about her vulvar incision.    Review of Systems: Constitutional: Feels well. No fever, chills.  Cardiovascular: No chest pain, shortness of breath, or edema.  Pulmonary: No cough or wheeze.  Gastrointestinal: No nausea, vomiting, or diarrhea. No bright red blood per rectum or change in bowel movement.  Genitourinary: No frequency, urgency, or dysuria. No vaginal bleeding or discharge.  Musculoskeletal: No myalgia or joint pain. Neurologic: No weakness, numbness, or change in gait.  Psychology: No depression, anxiety, or insomnia.  Current Meds:  Outpatient Encounter Medications as of 11/02/2018  Medication Sig  . aspirin 81 MG chewable tablet Chew 81 mg by mouth daily.  . cholecalciferol (VITAMIN D3) 25 MCG (1000 UT) tablet Take 2,000 Units by mouth every evening.  . hydrochlorothiazide (HYDRODIURIL) 25 MG tablet Take 12.5 mg by mouth daily.   Marland Kitchen levothyroxine (SYNTHROID) 75 MCG tablet Take 75 mcg by mouth daily before breakfast.  . magnesium hydroxide (MILK OF MAGNESIA) 400 MG/5ML suspension  Take 30 mLs by mouth daily as needed for constipation.  . Omega-3 Fatty Acids (FISH OIL) 1200 MG CAPS Take 1,200 mg by mouth 5 (five) times daily.  . Oxcarbazepine (TRILEPTAL) 300 MG tablet Take 150 mg by mouth 2 (two) times daily as needed (Trigeminal neuralgia).   . rosuvastatin (CRESTOR) 5 MG tablet Take 5 mg by mouth at  bedtime.  Marland Kitchen albuterol (VENTOLIN HFA) 108 (90 Base) MCG/ACT inhaler Inhale 2 puffs into the lungs every 6 (six) hours as needed for wheezing or shortness of breath.  Marland Kitchen ibuprofen (ADVIL) 200 MG tablet Take 400 mg by mouth every 6 (six) hours as needed for headache or moderate pain.  Marland Kitchen ibuprofen (ADVIL) 600 MG tablet Take 1 tablet (600 mg total) by mouth every 6 (six) hours as needed. For AFTER surgery (Patient not taking: Reported on 10/17/2018)  . traMADol (ULTRAM) 50 MG tablet Take 1 tablet (50 mg total) by mouth every 6 (six) hours as needed. For AFTER surgery, do not take and drive (Patient not taking: Reported on 10/17/2018)  . [DISCONTINUED] senna-docusate (SENOKOT-S) 8.6-50 MG tablet Take 2 tablets by mouth at bedtime. For AFTER surgery, do not take if having diarrhea (Patient not taking: Reported on 10/17/2018)   No facility-administered encounter medications on file as of 11/02/2018.     Allergy:  Allergies  Allergen Reactions  . Prednisone Other (See Comments)    Feels Dizzy  . Codeine Anxiety    "Jumpy inside"    Social Hx:   Social History   Socioeconomic History  . Marital status: Widowed    Spouse name: Not on file  . Number of children: Not on file  . Years of education: Not on file  . Highest education level: Not on file  Occupational History  . Not on file  Social Needs  . Financial resource strain: Not on file  . Food insecurity    Worry: Not on file    Inability: Not on file  . Transportation needs    Medical: Not on file    Non-medical: Not on file  Tobacco Use  . Smoking status: Former Smoker    Quit date: 12/08/2003    Years since quitting: 14.9  . Smokeless tobacco: Never Used  Substance and Sexual Activity  . Alcohol use: No  . Drug use: No  . Sexual activity: Not Currently  Lifestyle  . Physical activity    Days per week: Not on file    Minutes per session: Not on file  . Stress: Not on file  Relationships  . Social Herbalist on  phone: Not on file    Gets together: Not on file    Attends religious service: Not on file    Active member of club or organization: Not on file    Attends meetings of clubs or organizations: Not on file    Relationship status: Not on file  . Intimate partner violence    Fear of current or ex partner: Not on file    Emotionally abused: Not on file    Physically abused: Not on file    Forced sexual activity: Not on file  Other Topics Concern  . Not on file  Social History Narrative  . Not on file    Past Surgical Hx:  Past Surgical History:  Procedure Laterality Date  . ABDOMINAL HYSTERECTOMY     Pelvic lymphadenectomy  . BREAST SURGERY     left breast cyst in areolar area 46  years ago  . LYMPH NODE DISSECTION Bilateral 10/25/2018   Procedure: LYMPH NODE DISSECTION,BILATERAL INGUINOFEMORAL LYMPHADECTOMY;  Surgeon: [name], MD;  Location: [facility];  Service: Gynecology;  Laterality: Bilateral;  . RADICAL VULVECTOMY N/A 09/27/2018   Procedure: RADICAL VULVECTOMY;  Surgeon: [name], MD;  Location: [facility];  Service: Gynecology;  Laterality: N/A;  . SUPRAPUBIC CATHETER PLACEMENT      Past Medical Hx:  Past Medical History:  Diagnosis Date  . Complication of anesthesia    slow to wake up /  body temperature goes down   warm blankets  . Hyperlipidemia   . Hypertension   . Hypothyroidism   . Pneumonia   . PONV (postoperative nausea and vomiting)   . 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 Skin cancer Father     Vitals:  Blood pressure 128/70, pulse 70, temperature 98 F (36.7 C), temperature source Tympanic, resp. rate 18, height 5\' 5"  (1.651 m), weight 155 lb (70.3 kg), SpO2 98 %.  Physical Exam:  General: alert, cooperative and no distress Resp: clear to auscultation bilaterally Cardio: regular rate and rhythm, S1, S2 normal, no murmur, click, rub or gallop Extremities: non  pitting pedal edema noted on the left-pt states has been like that since fracture of ankle years ago  Bilateral groin incisions with dermabond in place, no erythema or drainage JP drains with minimal drainage present.  Serosanguinous drainage noted in the left drain, serous in the right.  Both drains removed without difficulty and patient tolerated well. Dry dressings placed.  Assessment/Plan: 78 year old female with Stage I vulvar cancer s/p bilateral superficial inguinal lymphadenectomy on 10/25/2018. She is doing well post-operatively.  Drain site care discussed and dressing supplies given. She is advised to follow up as scheduled on November 08, 2018 with Dr. Alycia Rossetti or sooner if needed.  Reportable signs and symptoms reviewed.  Dorothyann Gibbs, NP 11/02/2018, 11:05 AM

## 2018-11-06 NOTE — Progress Notes (Signed)
Consult Note: Gyn-Onc  Consult was requested by Dr. Modesta Messing for the evaluation of [name] 78 y.o. female  CC:  No chief complaint on file.   Assessment/Plan:  [name]  is a 78 y.o.  year old with clinical stage IB vulvar carcinoma.  HPI: [name] is a 78 year old P3 who was seen in consultation at the request of Dr. 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  The patient reported experiencing a palpable lump in the winter 2019 and 2020.  This is on the anterior portion of her vulva.  She put off seeing physician for this because she reported that she was busy.  It became more noticeable in April 2020 and she attempted to have an appointment with a gynecologist however she could not get an appointment due to the the coronavirus shot downs.  She was eventually able to see a physician on August 25, 2018 at which time evaluation of the vulva confirmed a vulvar mass.  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  The patient's medical history is significant for a remote history of cervical cancer diagnosed in 2008.  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 Marti Sleigh.  She has had no other prior abdominal surgeries.  She is an ex-smoker and quit in October 2004.  She is a nondrinker.  She denies urinary incontinence.  Her most significant medical comorbidities right now is trigeminal neuralgia.  She has had gamma knife radiation to treat this.  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 She was taken to the OR on 09/27/18 with Dr Alycia Rossetti for a radical anterior vulvectomy.  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  The resection margins were negative for carcinoma with the closest being the lateral margin 1:00 at 0.8 cm.  The carcinoma invaded 1.3 cm depth.  There was no lymphovascular or perineural invasion identified.  Postoperatively a  PET/CT was obtained on October 06, 2018.  This revealed postoperative changes to the vulva.  There was no hypermetabolic local regional lymphadenopathy.  There is a questionable subtle focus of hypermetabolism in the left lobe of the liver, indeterminate for liver metastases.  MRI abdomen could be performed for further work-up.  No other potential findings of hypermetabolic distant metastatic disease were seen.  Interval Hx: ENCOUNTER DATE: 10/25/2018 Preop Diagnosis: Stage I vulvar cancer Surgery: Bilateral superficial inguinal lymphadenectomy. Pathology: Bilateral superficial inguinal nodes. Operative findings: Well-healing vulva incision from prior surgery.  Some minimal separation as the sutures are dissolving.  No obvious groin lymphadenopathy.  Diagnosis 1. Lymph nodes, regional resection, left inguinal - FIBROADIPOSE TISSUE. - SEE COMMENT. 2. Lymph nodes, regional resection, right inguinal - THERE IS NO EVIDENCE OF CARCINOMA IN 2 OF 2 LYMPH NODE (0/2)  Drains removed 11/02/18.  She comes in today for follow-up.  She is overall doing well and really denies any significant complaints.  She states that she still has some vulvar discomfort but it is much better than it was before her surgery.  She has no discomfort in her groin.  She denies any bleeding, change in her bowel or bladder habits.  She has no ankle swelling.  She otherwise is well without complaints.  Current Meds:  Outpatient Encounter Medications as of 11/08/2018  Medication Sig  . albuterol (VENTOLIN HFA) 108 (90 Base) MCG/ACT inhaler Inhale 2 puffs into the lungs every 6 (six) hours as needed for wheezing or shortness  of breath.  Marland Kitchen aspirin 81 MG chewable tablet Chew 81 mg by mouth daily.  . cholecalciferol (VITAMIN D3) 25 MCG (1000 UT) tablet Take 2,000 Units by mouth every evening.  . hydrochlorothiazide (HYDRODIURIL) 25 MG tablet Take 12.5 mg by mouth daily.   Marland Kitchen levothyroxine (SYNTHROID) 75 MCG tablet Take 75 mcg by mouth  daily before breakfast.  . magnesium hydroxide (MILK OF MAGNESIA) 400 MG/5ML suspension Take 30 mLs by mouth daily as needed for constipation.  . Omega-3 Fatty Acids (FISH OIL) 1200 MG CAPS Take 1,200 mg by mouth 5 (five) times daily.  . Oxcarbazepine (TRILEPTAL) 300 MG tablet Take 150 mg by mouth 2 (two) times daily as needed (Trigeminal neuralgia).   . rosuvastatin (CRESTOR) 5 MG tablet Take 5 mg by mouth at bedtime.  . [DISCONTINUED] ibuprofen (ADVIL) 200 MG tablet Take 400 mg by mouth every 6 (six) hours as needed for headache or moderate pain.  . [DISCONTINUED] ibuprofen (ADVIL) 600 MG tablet Take 1 tablet (600 mg total) by mouth every 6 (six) hours as needed. For AFTER surgery (Patient not taking: Reported on 10/17/2018)  . [DISCONTINUED] traMADol (ULTRAM) 50 MG tablet Take 1 tablet (50 mg total) by mouth every 6 (six) hours as needed. For AFTER surgery, do not take and drive (Patient not taking: Reported on 10/17/2018)   No facility-administered encounter medications on file as of 11/08/2018.     Allergy:  Allergies  Allergen Reactions  . Prednisone Other (See Comments)    Feels Dizzy  . Codeine Anxiety    "Jumpy inside"    Social Hx:   Social History   Socioeconomic History  . Marital status: Widowed    Spouse name: Not on file  . Number of children: Not on file  . Years of education: Not on file  . Highest education level: Not on file  Occupational History  . Not on file  Social Needs  . Financial resource strain: Not on file  . Food insecurity    Worry: Not on file    Inability: Not on file  . Transportation needs    Medical: Not on file    Non-medical: Not on file  Tobacco Use  . Smoking status: Former Smoker    Quit date: 12/08/2003    Years since quitting: 14.9  . Smokeless tobacco: Never Used  Substance and Sexual Activity  . Alcohol use: No  . Drug use: No  . Sexual activity: Not Currently  Lifestyle  . Physical activity    Days per week: Not on file     Minutes per session: Not on file  . Stress: Not on file  Relationships  . Social Herbalist on phone: Not on file    Gets together: Not on file    Attends religious service: Not on file    Active member of club or organization: Not on file    Attends meetings of clubs or organizations: Not on file    Relationship status: Not on file  . Intimate partner violence    Fear of current or ex partner: Not on file    Emotionally abused: Not on file    Physically abused: Not on file    Forced sexual activity: Not on file  Other Topics Concern  . Not on file  Social History Narrative  . Not on file    Past Surgical Hx:  Past Surgical History:  Procedure Laterality Date  . ABDOMINAL HYSTERECTOMY     Pelvic  lymphadenectomy  . BREAST SURGERY     left breast cyst in areolar area 46 years ago  . LYMPH NODE DISSECTION Bilateral 10/25/2018   Procedure: LYMPH NODE DISSECTION,BILATERAL INGUINOFEMORAL LYMPHADECTOMY;  Surgeon: [name], MD;  Location: [facility];  Service: Gynecology;  Laterality: Bilateral;  . RADICAL VULVECTOMY N/A 09/27/2018   Procedure: RADICAL VULVECTOMY;  Surgeon: [name], MD;  Location: [facility];  Service: Gynecology;  Laterality: N/A;  . SUPRAPUBIC CATHETER PLACEMENT      Past Medical Hx:  Past Medical History:  Diagnosis Date  . Complication of anesthesia    slow to wake up /  body temperature goes down   warm blankets  . Hyperlipidemia   . Hypertension   . Hypothyroidism   . Pneumonia   . PONV (postoperative nausea and vomiting)   . 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  No LMP recorded. Patient is postmenopausal.  Family Hx:  Family History  Problem Relation Age of Onset  . Skin cancer Father     Vitals:  Blood pressure 110/75, pulse 80, temperature 98.7 F (37.1 C), temperature source  Temporal, resp. rate 18, height 5\' 5"  (1.651 m), weight 151 lb 7 oz (68.7 kg), SpO2 97 %.  Physical Exam: Well-nourished well-developed female in no acute distress.  Groins: Bilateral well-healing surgical incisions with no erythema.  Dermabond glue is still in place.  Drain sites are covered with Band-Aids.  Extremities: No edema.  Pelvic: Somewhat limited by patient tenderness.  The suture line is seen.  There is yellow exudate but no erythema.  The sutures are dissolving and there is only a small area measuring approximately a centimeter of separation on the very superior aspect of the incision.  There is a small amount of maceration of the left vulva secondary to moisture.  Assessment: 78 year old with a 1B vulvar cancer.  She is doing well postoperatively.  We discussed doing sitz bath and using a squirt bottle during the day to remove the small exudate from the vulva.  She has not been doing that instructions were provided as well as a sitz bath and squirt bottle today.  She will do this with each of her voids.  She states she usually empties her bladder 4 times during the day.  She will call us if she has any issues or problems.  Otherwise she will return to see me on November 10.  [name] A., MD  11/08/2018, 12:00 PM

## 2018-11-08 ENCOUNTER — Inpatient Hospital Stay: Payer: Medicare Other | Attending: Gynecologic Oncology | Admitting: Gynecologic Oncology

## 2018-11-08 ENCOUNTER — Other Ambulatory Visit: Payer: Self-pay

## 2018-11-08 VITALS — BP 110/75 | HR 80 | Temp 98.7°F | Resp 18 | Ht 65.0 in | Wt 151.4 lb

## 2018-11-08 DIAGNOSIS — Z79899 Other long term (current) drug therapy: Secondary | ICD-10-CM | POA: Insufficient documentation

## 2018-11-08 DIAGNOSIS — E039 Hypothyroidism, unspecified: Secondary | ICD-10-CM | POA: Insufficient documentation

## 2018-11-08 DIAGNOSIS — I10 Essential (primary) hypertension: Secondary | ICD-10-CM | POA: Insufficient documentation

## 2018-11-08 DIAGNOSIS — Z923 Personal history of irradiation: Secondary | ICD-10-CM | POA: Insufficient documentation

## 2018-11-08 DIAGNOSIS — E785 Hyperlipidemia, unspecified: Secondary | ICD-10-CM | POA: Insufficient documentation

## 2018-11-08 DIAGNOSIS — Z87891 Personal history of nicotine dependence: Secondary | ICD-10-CM | POA: Insufficient documentation

## 2018-11-08 DIAGNOSIS — Z90710 Acquired absence of both cervix and uterus: Secondary | ICD-10-CM | POA: Insufficient documentation

## 2018-11-08 DIAGNOSIS — Z7982 Long term (current) use of aspirin: Secondary | ICD-10-CM | POA: Insufficient documentation

## 2018-11-08 DIAGNOSIS — C519 Malignant neoplasm of vulva, unspecified: Secondary | ICD-10-CM | POA: Insufficient documentation

## 2018-11-08 NOTE — Patient Instructions (Addendum)
Please return to see me on November 10.  Please use the little spritz bottle to wash off the vulva every time you empty your bladder during the day.  You do not need to do this at night.  Please call us if you have any issues or problems before your follow-up visit.

## 2019-01-12 NOTE — Progress Notes (Signed)
Consult Note: Gyn-Onc  Consult was requested by Dr. Modesta Messing for the evaluation of [name] 78 y.o. female  CC:  Chief Complaint  Patient presents with  . Follow-up    Assessment/Plan:  [name]  is a 78 y.o.  year old with clinical stage IB vulvar carcinoma.  HPI: [name] is a 78 year old P3 who was seen in consultation at the request of Dr. 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  The patient reported experiencing a palpable lump in the winter 2019 and 2020.  This is on the anterior portion of her vulva.  She put off seeing physician for this because she reported that she was busy.  It became more noticeable in April 2020 and she attempted to have an appointment with a gynecologist however she could not get an appointment due to the the coronavirus shot downs.  She was eventually able to see a physician on August 25, 2018 at which time evaluation of the vulva confirmed a vulvar mass.  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  The patient's medical history is significant for a remote history of cervical cancer diagnosed in 2008.  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 Marti Sleigh.  She has had no other prior abdominal surgeries.  She is an ex-smoker and quit in October 2004.  She is a nondrinker.  She denies urinary incontinence.  Her most significant medical comorbidities right now is trigeminal neuralgia.  She has had gamma knife radiation to treat this.  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 She was taken to the OR on 09/27/18 with Dr Alycia Rossetti for a radical anterior vulvectomy.  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  The resection margins were negative for carcinoma with the closest being the lateral margin 1:00 at 0.8 cm.  The carcinoma invaded 1.3 cm depth.  There was no lymphovascular or perineural invasion  identified.  Postoperatively a PET/CT was obtained on October 06, 2018.  This revealed postoperative changes to the vulva.  There was no hypermetabolic local regional lymphadenopathy.  There is a questionable subtle focus of hypermetabolism in the left lobe of the liver, indeterminate for liver metastases.  MRI abdomen could be performed for further work-up.  No other potential findings of hypermetabolic distant metastatic disease were seen.  ENCOUNTER DATE: 10/25/2018 Preop Diagnosis: Stage I vulvar cancer Surgery: Bilateral superficial inguinal lymphadenectomy. Pathology: Bilateral superficial inguinal nodes. Operative findings: Well-healing vulva incision from prior surgery.  Some minimal separation as the sutures are dissolving.  No obvious groin lymphadenopathy.  Diagnosis 1. Lymph nodes, regional resection, left inguinal - FIBROADIPOSE TISSUE. - SEE COMMENT. 2. Lymph nodes, regional resection, right inguinal - THERE IS NO EVIDENCE OF CARCINOMA IN 2 OF 2 LYMPH NODE (0/2)  Interval History: I last saw her for a post op check 9/20. She comes in today for follow up.  She states that occasionally her vulva gets irritated about 2 times a week.  There is no particular pattern.  The irritation lasts only for a small while and then it goes away.  There is no bleeding, discharge or pain.  She states it feels nothing like her vulvar cancer did.  She thinks it secondary to the dissymmetry between the left and right side status post her vulvectomy.  Her review of systems is essentially negative.  She does complain of some occasional right ankle swelling but that is from  a sprain that she suffered when she was pregnant.  There are no new medical problems in her family.  Review of Systems  Constitutional: Denies fever. Skin: Vulvar irritation as above Cardiovascular: No chest pain, shortness of breath, slight edema as above Pulmonary: No cough Gastro Intestinal: No nausea, vomiting, or change in her  bowel or bladder habits.  She occasionally is bothered by hemorrhoids Genitourinary: Denies vaginal bleeding and discharge.  Musculoskeletal: No joint pain.  Psychology: No complaints  Current Meds:  Outpatient Encounter Medications as of 01/17/2019  Medication Sig  . albuterol (VENTOLIN HFA) 108 (90 Base) MCG/ACT inhaler Inhale 2 puffs into the lungs every 6 (six) hours as needed for wheezing or shortness of breath.  Marland Kitchen aspirin 81 MG chewable tablet Chew 81 mg by mouth daily.  . cholecalciferol (VITAMIN D3) 25 MCG (1000 UT) tablet Take 2,000 Units by mouth every evening.  . hydrochlorothiazide (HYDRODIURIL) 25 MG tablet Take 12.5 mg by mouth daily.   Marland Kitchen levothyroxine (SYNTHROID) 75 MCG tablet Take 75 mcg by mouth daily before breakfast.  . magnesium hydroxide (MILK OF MAGNESIA) 400 MG/5ML suspension Take 30 mLs by mouth daily as needed for constipation.  . Omega-3 Fatty Acids (FISH OIL) 1200 MG CAPS Take 1,200 mg by mouth 5 (five) times daily.  . Oxcarbazepine (TRILEPTAL) 300 MG tablet Take 150 mg by mouth 2 (two) times daily as needed (Trigeminal neuralgia).   . rosuvastatin (CRESTOR) 5 MG tablet Take 5 mg by mouth at bedtime.   No facility-administered encounter medications on file as of 01/17/2019.     Allergy:  Allergies  Allergen Reactions  . Prednisone Other (See Comments)    Feels Dizzy  . Codeine Anxiety    "Jumpy inside"    Social Hx:   Social History   Socioeconomic History  . Marital status: Widowed    Spouse name: Not on file  . Number of children: Not on file  . Years of education: Not on file  . Highest education level: Not on file  Occupational History  . Not on file  Social Needs  . Financial resource strain: Not on file  . Food insecurity    Worry: Not on file    Inability: Not on file  . Transportation needs    Medical: Not on file    Non-medical: Not on file  Tobacco Use  . Smoking status: Former Smoker    Quit date: 12/08/2003    Years since  quitting: 15.1  . Smokeless tobacco: Never Used  Substance and Sexual Activity  . Alcohol use: No  . Drug use: No  . Sexual activity: Not Currently  Lifestyle  . Physical activity    Days per week: Not on file    Minutes per session: Not on file  . Stress: Not on file  Relationships  . Social Herbalist on phone: Not on file    Gets together: Not on file    Attends religious service: Not on file    Active member of club or organization: Not on file    Attends meetings of clubs or organizations: Not on file    Relationship status: Not on file  . Intimate partner violence    Fear of current or ex partner: Not on file    Emotionally abused: Not on file    Physically abused: Not on file    Forced sexual activity: Not on file  Other Topics Concern  . Not on file  Social History  Narrative  . Not on file    Past Surgical Hx:  Past Surgical History:  Procedure Laterality Date  . ABDOMINAL HYSTERECTOMY     Pelvic lymphadenectomy  . BREAST SURGERY     left breast cyst in areolar area 46 years ago  . LYMPH NODE DISSECTION Bilateral 10/25/2018   Procedure: LYMPH NODE DISSECTION,BILATERAL INGUINOFEMORAL LYMPHADECTOMY;  Surgeon: [name], MD;  Location: [facility];  Service: Gynecology;  Laterality: Bilateral;  . RADICAL VULVECTOMY N/A 09/27/2018   Procedure: RADICAL VULVECTOMY;  Surgeon: [name], MD;  Location: [facility];  Service: Gynecology;  Laterality: N/A;  . SUPRAPUBIC CATHETER PLACEMENT      Past Medical Hx:  Past Medical History:  Diagnosis Date  . Complication of anesthesia    slow to wake up /  body temperature goes down   warm blankets  . Hyperlipidemia   . Hypertension   . Hypothyroidism   . Pneumonia   . PONV (postoperative nausea and vomiting)   . Squamous cell carcinoma of cervix ([place]) 04/2006   Stage IB1    Past Gynecological History:  Hx of SVD x 3, s/p rad hysterectomy for cervical cancer stage IB in 2008 - NED since that time.  No LMP  recorded. Patient is postmenopausal.  Family Hx:  Family History  Problem Relation Age of Onset  . Skin cancer Father     Vitals:  Blood pressure 119/72, pulse 71, temperature 98.5 F (36.9 C), temperature source Temporal, resp. rate 20, height 5\' 5"  (1.651 m), weight 159 lb (72.1 kg), SpO2 100 %.  Physical Exam: Well-nourished well-developed female in no acute distress.  Neck: Supple, no lymphadenopathy, no thyromegaly  Groins: Bilateral well-healed surgical incisions with no erythema.  There is still residual Dermabond that was removed.  There is no lymphadenopathy.  There is no lymphocyst  Extremities: No edema.  Pelvic: Somewhat limited by patient tenderness.  There is no evidence of vulvar cancer.  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  I do not believe that we need to move forward with a biopsy today.  As there is nothing exophytic or hyperkeratotic just diffuse erythematous changes.  The area on her left measures approximately 2 x 2 cm with a small approximately 1 cm area on the right side going near the urethra  Assessment: 78 year old with a 1B vulvar cancer s/p definitive surgery 10/2018.  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  I did not think a biopsy was necessary today.  We will treat her empirically with triamcinolone cream.  She was instructed to apply it twice a day and then when her symptoms improved to go down to once a day.  She will follow-up with me on December 16 to ensure continued improvement.   Jeany Seville A., MD  01/17/2019, 11:44 AM

## 2019-01-17 ENCOUNTER — Other Ambulatory Visit: Payer: Self-pay

## 2019-01-17 ENCOUNTER — Encounter: Payer: Self-pay | Admitting: Gynecologic Oncology

## 2019-01-17 ENCOUNTER — Other Ambulatory Visit: Payer: Self-pay | Admitting: Gynecologic Oncology

## 2019-01-17 ENCOUNTER — Inpatient Hospital Stay: Payer: Medicare Other | Attending: Gynecologic Oncology | Admitting: Gynecologic Oncology

## 2019-01-17 VITALS — BP 119/72 | HR 71 | Temp 98.5°F | Resp 20 | Ht 65.0 in | Wt 159.0 lb

## 2019-01-17 DIAGNOSIS — R590 Localized enlarged lymph nodes: Secondary | ICD-10-CM | POA: Insufficient documentation

## 2019-01-17 DIAGNOSIS — I10 Essential (primary) hypertension: Secondary | ICD-10-CM | POA: Diagnosis not present

## 2019-01-17 DIAGNOSIS — G500 Trigeminal neuralgia: Secondary | ICD-10-CM | POA: Insufficient documentation

## 2019-01-17 DIAGNOSIS — E039 Hypothyroidism, unspecified: Secondary | ICD-10-CM | POA: Insufficient documentation

## 2019-01-17 DIAGNOSIS — Z7982 Long term (current) use of aspirin: Secondary | ICD-10-CM | POA: Diagnosis not present

## 2019-01-17 DIAGNOSIS — Z79899 Other long term (current) drug therapy: Secondary | ICD-10-CM | POA: Diagnosis not present

## 2019-01-17 DIAGNOSIS — C519 Malignant neoplasm of vulva, unspecified: Secondary | ICD-10-CM | POA: Diagnosis present

## 2019-01-17 DIAGNOSIS — E785 Hyperlipidemia, unspecified: Secondary | ICD-10-CM | POA: Insufficient documentation

## 2019-01-17 DIAGNOSIS — Z90710 Acquired absence of both cervix and uterus: Secondary | ICD-10-CM

## 2019-01-17 DIAGNOSIS — Z87891 Personal history of nicotine dependence: Secondary | ICD-10-CM | POA: Diagnosis not present

## 2019-01-17 DIAGNOSIS — Z90722 Acquired absence of ovaries, bilateral: Secondary | ICD-10-CM | POA: Diagnosis not present

## 2019-01-17 MED ORDER — NYSTATIN-TRIAMCINOLONE 100000-0.1 UNIT/GM-% EX OINT: TOPICAL_OINTMENT | CUTANEOUS | 2 refills | Status: DC

## 2019-01-17 NOTE — Patient Instructions (Addendum)
Please return to see me 02/22/19.Triamcinolone skin cream, ointment, lotion, or aerosol What is this medicine? TRIAMCINOLONE (trye am SIN oh lone) is a corticosteroid. It is used on the skin to reduce swelling, redness, itching, and allergic reactions. This medicine may be used for other purposes; ask your health care provider or pharmacist if you have questions. COMMON BRAND NAME(S): Aristocort, Aristocort A, Aristocort HP, Cinalog, Cinolar, DERMASORB TA Complete, Flutex, Kenalog, Pediaderm TA, SP Rx 228, Triacet, Trianex, Triderm What should I tell my health care provider before I take this medicine? They need to know if you have any of these conditions:  any active infection  large areas of burned or damaged skin  skin wasting or thinning  an unusual or allergic reaction to triamcinolone, corticosteroids, other medicines, foods, dyes, or preservatives  pregnant or trying to get pregnant  breast-feeding How should I use this medicine? This medicine is for external use only. Do not take by mouth. Follow the directions on the prescription label. Wash your hands before and after use. Apply a thin film of medicine to the affected area. Do not cover with a bandage or dressing unless your doctor or health care professional tells you to. Do not use on healthy skin or over large areas of skin. Do not get this medicine in your eyes. If you do, rinse out with plenty of cool tap water. It is important not to use more medicine than prescribed. Do not use your medicine more often than directed. Talk to your pediatrician regarding the use of this medicine in children. Special care may be needed. Elderly patients are more likely to have damaged skin through aging, and this may increase side effects. This medicine should only be used for brief periods and infrequently in older patients. Overdosage: If you think you have taken too much of this medicine contact a poison control center or emergency room at  once. NOTE: This medicine is only for you. Do not share this medicine with others. What if I miss a dose? If you miss a dose, use it as soon as you can. If it is almost time for your next dose, use only that dose. Do not use double or extra doses. What may interact with this medicine? Interactions are not expected. This list may not describe all possible interactions. Give your health care provider a list of all the medicines, herbs, non-prescription drugs, or dietary supplements you use. Also tell them if you smoke, drink alcohol, or use illegal drugs. Some items may interact with your medicine. What should I watch for while using this medicine? Tell your doctor or health care professional if your symptoms do not start to get better within one week. Do not use for more than 14 days. Do not use on healthy skin or over large areas of skin. Tell your doctor or health care professional if you are exposed to anyone with measles or chickenpox, or if you develop sores or blisters that do not heal properly. Do not use an airtight bandage to cover the affected area unless your doctor or health care professional tells you to. If you are to cover the area, follow the instructions carefully. Covering the area where the medicine is applied can increase the amount that passes through the skin and increases the risk of side effects. If treating the diaper area of a child, avoid covering the treated area with tight-fitting diapers or plastic pants. This may increase the amount of medicine that passes through the skin and  increase the risk of serious side effects. What side effects may I notice from receiving this medicine? Side effects that you should report to your doctor or health care professional as soon as possible:  burning or itching of the skin  dark red spots on the skin  infection  painful, red, pus filled blisters in hair follicles  thinning of the skin, sunburn more likely especially on the  face Side effects that usually do not require medical attention (report to your doctor or health care professional if they continue or are bothersome):  dry skin, irritation  unusual increased growth of hair on the face or body This list may not describe all possible side effects. Call your doctor for medical advice about side effects. You may report side effects to FDA at 1-800-FDA-1088. Where should I keep my medicine? Keep out of the reach of children. Store at room temperature between 15 and 30 degrees C (59 and 86 degrees F). Do not freeze. Throw away any unused medicine after the expiration date. NOTE: This sheet is a summary. It may not cover all possible information. If you have questions about this medicine, talk to your doctor, pharmacist, or health care provider.  2020 Elsevier/Gold Standard (2017-11-25 10:50:30)

## 2019-02-20 NOTE — Progress Notes (Deleted)
Consult Note: Gyn-Onc  Consult was requested by Dr. Modesta Messing for the evaluation of [name] 78 y.o. female  CC:  No chief complaint on file.   Assessment/Plan:  [name]  is a 78 y.o.  year old with clinical stage IB vulvar carcinoma.  HPI: [name] is a 78 year old P3 who was seen in consultation at the request of Dr. 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  The patient reported experiencing a palpable lump in the winter 2019 and 2020.  This is on the anterior portion of her vulva.  She put off seeing physician for this because she reported that she was busy.  It became more noticeable in April 2020 and she attempted to have an appointment with a gynecologist however she could not get an appointment due to the the coronavirus shot downs.  She was eventually able to see a physician on August 25, 2018 at which time evaluation of the vulva confirmed a vulvar mass.  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  The patient's medical history is significant for a remote history of cervical cancer diagnosed in 2008.  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 Marti Sleigh.  She has had no other prior abdominal surgeries.  She is an ex-smoker and quit in October 2004.  She is a nondrinker.  She denies urinary incontinence.  Her most significant medical comorbidities right now is trigeminal neuralgia.  She has had gamma knife radiation to treat this.  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 She was taken to the OR on 09/27/18 with Dr Alycia Rossetti for a radical anterior vulvectomy.  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  The resection margins were negative for carcinoma with the closest being the lateral margin 1:00 at 0.8 cm.  The carcinoma invaded 1.3 cm depth.  There was no lymphovascular or perineural invasion identified.  Postoperatively a  PET/CT was obtained on October 06, 2018.  This revealed postoperative changes to the vulva.  There was no hypermetabolic local regional lymphadenopathy.  There is a questionable subtle focus of hypermetabolism in the left lobe of the liver, indeterminate for liver metastases.  MRI abdomen could be performed for further work-up.  No other potential findings of hypermetabolic distant metastatic disease were seen.  ENCOUNTER DATE: 10/25/2018 Preop Diagnosis: Stage I vulvar cancer Surgery: Bilateral superficial inguinal lymphadenectomy. Pathology: Bilateral superficial inguinal nodes. Operative findings: Well-healing vulva incision from prior surgery.  Some minimal separation as the sutures are dissolving.  No obvious groin lymphadenopathy.  Diagnosis 1. Lymph nodes, regional resection, left inguinal - FIBROADIPOSE TISSUE. - SEE COMMENT. 2. Lymph nodes, regional resection, right inguinal - THERE IS NO EVIDENCE OF CARCINOMA IN 2 OF 2 LYMPH NODE (0/2)  Interval History: I last saw her 11/20. 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  I do not believe that we need to move forward with a biopsy today.  As there is nothing exophytic or hyperkeratotic just diffuse erythematous changes.  The area on her left measures approximately 2 x 2 cm with a small approximately 1 cm area on the right side going near the urethra". She was instructed to come in for follow up s/p using steroid cream.  Review of Systems  Constitutional: Denies fever. Skin: Vulvar irritation as above Cardiovascular: No chest pain, shortness of breath, slight edema as above  Pulmonary: No cough Gastro Intestinal: No nausea, vomiting, or change in her bowel or bladder habits.  She occasionally is bothered by hemorrhoids Genitourinary: Denies vaginal bleeding and discharge.  Musculoskeletal: No joint pain.  Psychology: No complaints  Current Meds:  Outpatient Encounter  Medications as of 02/22/2019  Medication Sig  . albuterol (VENTOLIN HFA) 108 (90 Base) MCG/ACT inhaler Inhale 2 puffs into the lungs every 6 (six) hours as needed for wheezing or shortness of breath.  Marland Kitchen aspirin 81 MG chewable tablet Chew 81 mg by mouth daily.  . cholecalciferol (VITAMIN D3) 25 MCG (1000 UT) tablet Take 2,000 Units by mouth every evening.  . clotrimazole (LOTRIMIN) 1 % cream Apply to affected area twice a day and then decrease to once a day as irritation decreases.  . hydrochlorothiazide (HYDRODIURIL) 25 MG tablet Take 12.5 mg by mouth daily.   Marland Kitchen levothyroxine (SYNTHROID) 75 MCG tablet Take 75 mcg by mouth daily before breakfast.  . magnesium hydroxide (MILK OF MAGNESIA) 400 MG/5ML suspension Take 30 mLs by mouth daily as needed for constipation.  . Omega-3 Fatty Acids (FISH OIL) 1200 MG CAPS Take 1,200 mg by mouth 5 (five) times daily.  . Oxcarbazepine (TRILEPTAL) 300 MG tablet Take 150 mg by mouth 2 (two) times daily as needed (Trigeminal neuralgia).   . rosuvastatin (CRESTOR) 5 MG tablet Take 5 mg by mouth at bedtime.   No facility-administered encounter medications on file as of 02/22/2019.    Allergy:  Allergies  Allergen Reactions  . Prednisone Other (See Comments)    Feels Dizzy  . Codeine Anxiety    "Jumpy inside"    Social Hx:   Social History   Socioeconomic History  . Marital status: Widowed    Spouse name: Not on file  . Number of children: Not on file  . Years of education: Not on file  . Highest education level: Not on file  Occupational History  . Not on file  Tobacco Use  . Smoking status: Former Smoker    Quit date: 12/08/2003    Years since quitting: 15.2  . Smokeless tobacco: Never Used  Substance and Sexual Activity  . Alcohol use: No  . Drug use: No  . Sexual activity: Not Currently  Other Topics Concern  . Not on file  Social History Narrative  . Not on file   Social Determinants of Health   Financial Resource Strain:   .  Difficulty of Paying Living Expenses: Not on file  Food Insecurity:   . Worried About Charity fundraiser in the Last Year: Not on file  . Ran Out of Food in the Last Year: Not on file  Transportation Needs:   . Lack of Transportation (Medical): Not on file  . Lack of Transportation (Non-Medical): Not on file  Physical Activity:   . Days of Exercise per Week: Not on file  . Minutes of Exercise per Session: Not on file  Stress:   . Feeling of Stress : Not on file  Social Connections:   . Frequency of Communication with Friends and Family: Not on file  . Frequency of Social Gatherings with Friends and Family: Not on file  . Attends Religious Services: Not on file  . Active Member of Clubs or Organizations: Not on file  . Attends Archivist Meetings: Not on file  . Marital Status: Not on file  Intimate Partner Violence:   . Fear of Current or Ex-Partner: Not on file  . Emotionally Abused: Not  on file  . Physically Abused: Not on file  . Sexually Abused: Not on file    Past Surgical Hx:  Past Surgical History:  Procedure Laterality Date  . ABDOMINAL HYSTERECTOMY     Pelvic lymphadenectomy  . BREAST SURGERY     left breast cyst in areolar area 46 years ago  . LYMPH NODE DISSECTION Bilateral 10/25/2018   Procedure: LYMPH NODE DISSECTION,BILATERAL INGUINOFEMORAL LYMPHADECTOMY;  Surgeon: [name], MD;  Location: [facility];  Service: Gynecology;  Laterality: Bilateral;  . RADICAL VULVECTOMY N/A 09/27/2018   Procedure: RADICAL VULVECTOMY;  Surgeon: [name], MD;  Location: [facility];  Service: Gynecology;  Laterality: N/A;  . SUPRAPUBIC CATHETER PLACEMENT      Past Medical Hx:  Past Medical History:  Diagnosis Date  . Complication of anesthesia    slow to wake up /  body temperature goes down   warm blankets  . Hyperlipidemia   . Hypertension   . Hypothyroidism   . Pneumonia   . PONV (postoperative nausea and vomiting)   . Squamous cell carcinoma of cervix ([place])  04/2006   Stage IB1    Past Gynecological History:  Hx of SVD x 3, s/p rad hysterectomy for cervical cancer stage IB in 2008 - NED since that time.  No LMP recorded. Patient is postmenopausal.  Family Hx:  Family History  Problem Relation Age of Onset  . Skin cancer Father     Vitals:  There were no vitals taken for this visit.  Physical Exam: Well-nourished well-developed female in no acute distress.  Neck: Supple, no lymphadenopathy, no thyromegaly  Groins: Bilateral well-healed surgical incisions with no erythema.  There is still residual Dermabond that was removed.  There is no lymphadenopathy.  There is no lymphocyst  Extremities: No edema.  Pelvic: Somewhat limited by patient tenderness.  There is no evidence of vulvar cancer.  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  I do not believe that we need to move forward with a biopsy today.  As there is nothing exophytic or hyperkeratotic just diffuse erythematous changes.  The area on her left measures approximately 2 x 2 cm with a small approximately 1 cm area on the right side going near the urethra  Assessment: 78 year old with a 1B vulvar cancer s/p definitive surgery 10/2018.     [name] A., MD  02/20/2019, 5:11 PM

## 2019-02-21 ENCOUNTER — Telehealth: Payer: Self-pay | Admitting: *Deleted

## 2019-02-21 NOTE — Telephone Encounter (Signed)
Patient acalled and moved her appt from tomorrow to 12/30 due to weather

## 2019-02-22 ENCOUNTER — Ambulatory Visit: Payer: Medicare Other | Admitting: Gynecologic Oncology

## 2019-02-22 DIAGNOSIS — C519 Malignant neoplasm of vulva, unspecified: Secondary | ICD-10-CM

## 2019-03-06 NOTE — Progress Notes (Signed)
Gynecologic Oncology Return Clinic Visit  03/08/19   Reason for Visit: Follow-up vulvar symptoms  Treatment History: [name] is a 78 year old P3 who was seen in consultation at the request of Dr. 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  The patient reported experiencing a palpable lump in the winter 2019 and 2020.  This is on the anterior portion of her vulva.  She put off seeing physician for this because she reported that she was busy.  It became more noticeable in April 2020 and she attempted to have an appointment with a gynecologist however she could not get an appointment due to the the coronavirus shot downs.  She was eventually able to see a physician on August 25, 2018 at which time evaluation of the vulva confirmed a vulvar mass.  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  The patient's medical history is significant for a remote history of cervical cancer diagnosed in 2008.  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 Marti Sleigh.  She has had no other prior abdominal surgeries.  She is an ex-smoker and quit in October 2004.  She is a nondrinker.  She denies urinary incontinence.  Her most significant medical comorbidities right now is trigeminal neuralgia.  She has had gamma knife radiation to treat this.  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 She was taken to the OR on 09/27/18 with Dr Alycia Rossetti for a radical anterior vulvectomy.  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  The resection margins were negative for carcinoma with the closest being the lateral margin 1:00 at 0.8 cm.  The carcinoma invaded 1.3 cm depth.  There was no lymphovascular or perineural invasion identified.  Postoperatively a PET/CT was obtained on October 06, 2018.  This revealed postoperative changes to the vulva.  There was no hypermetabolic local regional  lymphadenopathy.  There is a questionable subtle focus of hypermetabolism in the left lobe of the liver, indeterminate for liver metastases.  MRI abdomen could be performed for further work-up.  No other potential findings of hypermetabolic distant metastatic disease were seen.  On 10/25/2018, the patient underwent bilateral superficial inguinal lymphadenectomy with fibroadipose tissue noted on the left and no carcinoma seen in 2 of 2 lymph nodes on the right.  Interval History: The patient reports doing overall well since her last visit.  She was not able to pick up the prescription that Dr. Alycia Rossetti sent in but was given a similar steroid cream.  She used it twice a day for a week and is now using it once a day.  She feels that overall her vulva is less tender although still red.  The irritation has improved some.  She denies any associated vaginal bleeding or discharge.  Past Medical/Surgical History: Past Medical History:  Diagnosis Date  . Complication of anesthesia    slow to wake up /  body temperature goes down   warm blankets  . Hyperlipidemia   . Hypertension   . Hypothyroidism   . Pneumonia   . PONV (postoperative nausea and vomiting)   . 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 ABDOMINAL HYSTERECTOMY     Pelvic lymphadenectomy  . BREAST SURGERY     left breast cyst in areolar area 46 years ago  . LYMPH NODE DISSECTION Bilateral 10/25/2018   Procedure: LYMPH  NODE DISSECTION,BILATERAL INGUINOFEMORAL LYMPHADECTOMY;  Surgeon: [name], MD;  Location: [facility];  Service: Gynecology;  Laterality: Bilateral;  . RADICAL VULVECTOMY N/A 09/27/2018   Procedure: RADICAL VULVECTOMY;  Surgeon: [name], MD;  Location: [facility];  Service: Gynecology;  Laterality: N/A;  . SUPRAPUBIC CATHETER PLACEMENT      Family History  Problem Relation Age of Onset  . Skin cancer Father     Social History    Socioeconomic History  . Marital status: Widowed    Spouse name: Not on file  . Number of children: Not on file  . Years of education: Not on file  . Highest education level: Not on file  Occupational History  . Not on file  Tobacco Use  . Smoking status: Former Smoker    Quit date: 12/08/2003    Years since quitting: 15.2  . Smokeless tobacco: Never Used  Substance and Sexual Activity  . Alcohol use: No  . Drug use: No  . Sexual activity: Not Currently  Other Topics Concern  . Not on file  Social History Narrative  . Not on file   Social Determinants of Health   Financial Resource Strain:   . Difficulty of Paying Living Expenses: Not on file  Food Insecurity:   . Worried About Charity fundraiser in the Last Year: Not on file  . Ran Out of Food in the Last Year: Not on file  Transportation Needs:   . Lack of Transportation (Medical): Not on file  . Lack of Transportation (Non-Medical): Not on file  Physical Activity:   . Days of Exercise per Week: Not on file  . Minutes of Exercise per Session: Not on file  Stress:   . Feeling of Stress : Not on file  Social Connections:   . Frequency of Communication with Friends and Family: Not on file  . Frequency of Social Gatherings with Friends and Family: Not on file  . Attends Religious Services: Not on file  . Active Member of Clubs or Organizations: Not on file  . Attends Archivist Meetings: Not on file  . Marital Status: Not on file    Current Medications:  Current Outpatient Medications:  .  albuterol (VENTOLIN HFA) 108 (90 Base) MCG/ACT inhaler, Inhale 2 puffs into the lungs every 6 (six) hours as needed for wheezing or shortness of breath., Disp: , Rfl:  .  aspirin 81 MG chewable tablet, Chew 81 mg by mouth daily., Disp: , Rfl:  .  cholecalciferol (VITAMIN D3) 25 MCG (1000 UT) tablet, Take 2,000 Units by mouth every evening., Disp: , Rfl:  .  clotrimazole (LOTRIMIN) 1 % cream, Apply to affected area  twice a day and then decrease to once a day as irritation decreases., Disp: 60 g, Rfl: 3 .  hydrochlorothiazide (HYDRODIURIL) 25 MG tablet, Take 12.5 mg by mouth daily. , Disp: , Rfl:  .  levothyroxine (SYNTHROID) 75 MCG tablet, Take 75 mcg by mouth daily before breakfast., Disp: , Rfl:  .  magnesium hydroxide (MILK OF MAGNESIA) 400 MG/5ML suspension, Take 30 mLs by mouth daily as needed for constipation., Disp: , Rfl:  .  Omega-3 Fatty Acids (FISH OIL) 1200 MG CAPS, Take 1,200 mg by mouth 5 (five) times daily., Disp: , Rfl:  .  Oxcarbazepine (TRILEPTAL) 300 MG tablet, Take 150 mg by mouth 2 (two) times daily as needed (Trigeminal neuralgia). , Disp: , Rfl:  .  rosuvastatin (CRESTOR) 5 MG tablet, Take 5 mg by mouth at  bedtime., Disp: , Rfl:   Review of Symptoms: Denies appetite changes, fevers, chills, fatigue, unexplained weight changes. Denies hearing loss, neck lumps or masses, mouth sores, ringing in ears or voice changes. Denies cough or wheezing.  Denies shortness of breath. Denies chest pain or palpitations. Denies leg swelling. Denies abdominal distention, pain, blood in stools, constipation, diarrhea, nausea, vomiting, or early satiety. Denies pain with intercourse, dysuria, frequency, hematuria or incontinence. Denies hot flashes, pelvic pain, vaginal bleeding or vaginal discharge.   Denies joint pain, back pain or muscle pain/cramps. Denies itching, rash, or wounds. Denies dizziness, headaches, numbness or seizures. Denies swollen lymph nodes or glands, denies easy bruising or bleeding. Denies anxiety, depression, confusion, or decreased concentration.  Physical Exam: BP 123/67 (BP Location: Left Arm, Patient Position: Sitting)   Pulse 77   Temp 98.3 F (36.8 C)   Resp 18   Ht 5\' 5"  (1.651 m)   Wt 159 lb 9 oz (72.4 kg)   BMI 26.55 kg/m  General: Alert, oriented, no acute distress. HEENT: sclera anicteric. Chest: Clear to auscultation bilaterally.   Cardiovascular:  Regular rate and rhythm, no murmurs. Abdomen: soft, nontender.  Normoactive bowel sounds.  No masses or hepatosplenomegaly appreciated.  Extremities: Grossly normal range of motion.  Warm, well perfused.  No edema bilaterally. Skin: No rashes or lesions noted. Lymphatics: No cervical, supraclavicular, or inguinal adenopathy. GU: External female genitalia and notable for erythema spanning the inner portion of the patient's labia on the left, measuring approximately 3 cm, and a smaller similarly erythematous portion on the right measuring 1-2 cm.  There is mild tenderness with palpation over this area.  No hyper pigmentation or lesion noted.  Laboratory & Radiologic Studies: none  Assessment & Plan: [name] is a 79 y.o. 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  The patient was Prescribed triamcinolone cream although it sounds like the pharmacy gave her hydrocortisone.  She has been using this as instructed and noted some improvement in her symptoms.  On exam today, she has what appears to be significant irritation of the vulva.  I have asked her to continue using the steroid cream and have also recommended that she start using Desitin as a barrier.  I will see her back in 6 weeks to reassess her symptoms as well as the appearance of her vulva.  Jeral Pinch, MD  Division of Gynecologic Oncology  Department of Obstetrics and Gynecology  [facility]

## 2019-03-08 ENCOUNTER — Other Ambulatory Visit: Payer: Self-pay

## 2019-03-08 ENCOUNTER — Inpatient Hospital Stay: Payer: Medicare Other | Attending: Gynecologic Oncology | Admitting: Gynecologic Oncology

## 2019-03-08 ENCOUNTER — Encounter: Payer: Self-pay | Admitting: Gynecologic Oncology

## 2019-03-08 VITALS — BP 123/67 | HR 77 | Temp 98.3°F | Resp 18 | Ht 65.0 in | Wt 159.6 lb

## 2019-03-08 DIAGNOSIS — E039 Hypothyroidism, unspecified: Secondary | ICD-10-CM | POA: Diagnosis not present

## 2019-03-08 DIAGNOSIS — C519 Malignant neoplasm of vulva, unspecified: Secondary | ICD-10-CM | POA: Diagnosis not present

## 2019-03-08 DIAGNOSIS — Z7982 Long term (current) use of aspirin: Secondary | ICD-10-CM | POA: Insufficient documentation

## 2019-03-08 DIAGNOSIS — Z87891 Personal history of nicotine dependence: Secondary | ICD-10-CM

## 2019-03-08 DIAGNOSIS — N762 Acute vulvitis: Secondary | ICD-10-CM

## 2019-03-08 DIAGNOSIS — I10 Essential (primary) hypertension: Secondary | ICD-10-CM | POA: Insufficient documentation

## 2019-03-08 NOTE — Patient Instructions (Signed)
I'd like you to decrease the topical steroid use to every other day and pick up desitin (or other zinc oxide based cream) to use daily (or multiple times as needed).

## 2019-04-17 NOTE — Progress Notes (Signed)
Gynecologic Oncology Return Clinic Visit  04/21/19  Reason for Visit: Follow-up of vulvar irritation  Treatment History: [name] is a 79 year old P3 who was seen in consultation at the request of Dr. 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  The patient reported experiencing a palpable lump in the winter 2019 and 2020.  This is on the anterior portion of her vulva.  She put off seeing physician for this because she reported that she was busy.  It became more noticeable in April 2020 and she attempted to have an appointment with a gynecologist however she could not get an appointment due to the the coronavirus shot downs.  She was eventually able to see a physician on August 25, 2018 at which time evaluation of the vulva confirmed a vulvar mass.  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  The patient's medical history is significant for a remote history of cervical cancer diagnosed in 2008.  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 Marti Sleigh.  She has had no other prior abdominal surgeries.  She is an ex-smoker and quit in October 2004.  She is a nondrinker.  She denies urinary incontinence.  Her most significant medical comorbidities right now is trigeminal neuralgia.  She has had gamma knife radiation to treat this.  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 She was taken to the OR on 09/27/18 with Dr Alycia Rossetti for a radical anterior vulvectomy.  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  The resection margins were negative for carcinoma with the closest being the lateral margin 1:00 at 0.8 cm.  The carcinoma invaded 1.3 cm depth.  There was no lymphovascular or perineural invasion identified.  Postoperatively a PET/CT was obtained on October 06, 2018.  This revealed postoperative changes to the vulva.  There was no hypermetabolic local regional  lymphadenopathy.  There is a questionable subtle focus of hypermetabolism in the left lobe of the liver, indeterminate for liver metastases.  MRI abdomen could be performed for further work-up.  No other potential findings of hypermetabolic distant metastatic disease were seen.  On 10/25/2018, the patient underwent bilateral superficial inguinal lymphadenectomy with fibroadipose tissue noted on the left and no carcinoma seen in 2 of 2 lymph nodes on the right.  Interval History: Reports overall no significant change since her last visit.  She continued using steroid cream and started using the Desitin, which she felt made her symptoms significantly worse.  For the last 3 or 4 days, she has not been using anything and thinks that the irritation has improved slightly.  She continues to note some burning after urination.  She is using a squirt bottle to rinse with water before she wipes.  She denies any irritation with bowel movements.  She denies any bleeding or discharge.  Past Medical/Surgical History: Past Medical History:  Diagnosis Date  . Complication of anesthesia    slow to wake up /  body temperature goes down   warm blankets  . Hyperlipidemia   . Hypertension   . Hypothyroidism   . Pneumonia   . PONV (postoperative nausea and vomiting)   . 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 ABDOMINAL HYSTERECTOMY     Pelvic lymphadenectomy  . BREAST SURGERY     left breast cyst in areolar area 46 years ago  .  LYMPH NODE DISSECTION Bilateral 10/25/2018   Procedure: LYMPH NODE DISSECTION,BILATERAL INGUINOFEMORAL LYMPHADECTOMY;  Surgeon: [name], MD;  Location: [facility];  Service: Gynecology;  Laterality: Bilateral;  . RADICAL VULVECTOMY N/A 09/27/2018   Procedure: RADICAL VULVECTOMY;  Surgeon: [name], MD;  Location: [facility];  Service: Gynecology;  Laterality: N/A;  . SUPRAPUBIC CATHETER PLACEMENT       Family History  Problem Relation Age of Onset  . Skin cancer Father     Social History   Socioeconomic History  . Marital status: Widowed    Spouse name: Not on file  . Number of children: Not on file  . Years of education: Not on file  . Highest education level: Not on file  Occupational History  . Not on file  Tobacco Use  . Smoking status: Former Smoker    Quit date: 12/08/2003    Years since quitting: 15.3  . Smokeless tobacco: Never Used  Substance and Sexual Activity  . Alcohol use: No  . Drug use: No  . Sexual activity: Not Currently  Other Topics Concern  . Not on file  Social History Narrative  . Not on file   Social Determinants of Health   Financial Resource Strain:   . Difficulty of Paying Living Expenses: Not on file  Food Insecurity:   . Worried About Charity fundraiser in the Last Year: Not on file  . Ran Out of Food in the Last Year: Not on file  Transportation Needs:   . Lack of Transportation (Medical): Not on file  . Lack of Transportation (Non-Medical): Not on file  Physical Activity:   . Days of Exercise per Week: Not on file  . Minutes of Exercise per Session: Not on file  Stress:   . Feeling of Stress : Not on file  Social Connections:   . Frequency of Communication with Friends and Family: Not on file  . Frequency of Social Gatherings with Friends and Family: Not on file  . Attends Religious Services: Not on file  . Active Member of Clubs or Organizations: Not on file  . Attends Archivist Meetings: Not on file  . Marital Status: Not on file    Current Medications:  Current Outpatient Medications:  .  albuterol (VENTOLIN HFA) 108 (90 Base) MCG/ACT inhaler, Inhale 2 puffs into the lungs every 6 (six) hours as needed for wheezing or shortness of breath., Disp: , Rfl:  .  aspirin 81 MG chewable tablet, Chew 81 mg by mouth daily., Disp: , Rfl:  .  cholecalciferol (VITAMIN D3) 25 MCG (1000 UT) tablet, Take 2,000 Units by  mouth every evening., Disp: , Rfl:  .  clotrimazole (LOTRIMIN) 1 % cream, Apply to affected area twice a day and then decrease to once a day as irritation decreases., Disp: 60 g, Rfl: 3 .  hydrochlorothiazide (HYDRODIURIL) 25 MG tablet, Take 12.5 mg by mouth daily. , Disp: , Rfl:  .  levothyroxine (SYNTHROID) 75 MCG tablet, Take 75 mcg by mouth daily before breakfast., Disp: , Rfl:  .  magnesium hydroxide (MILK OF MAGNESIA) 400 MG/5ML suspension, Take 30 mLs by mouth daily as needed for constipation., Disp: , Rfl:  .  Omega-3 Fatty Acids (FISH OIL) 1200 MG CAPS, Take 1,200 mg by mouth 5 (five) times daily., Disp: , Rfl:  .  Oxcarbazepine (TRILEPTAL) 300 MG tablet, Take 150 mg by mouth 2 (two) times daily as needed (Trigeminal neuralgia). , Disp: , Rfl:  .  rosuvastatin (CRESTOR)  5 MG tablet, Take 5 mg by mouth at bedtime., Disp: , Rfl:  .  Lidocaine HCl 2.5 % GEL, Apply 1 application topically 3 (three) times daily as needed (to the vulva)., Disp: 57 g, Rfl: 2  Review of Systems: Denies appetite changes, fevers, chills, fatigue, unexplained weight changes. Denies hearing loss, neck lumps or masses, mouth sores, ringing in ears or voice changes. Denies cough or wheezing.  Denies shortness of breath. Denies chest pain or palpitations. Denies leg swelling. Denies abdominal distention, pain, blood in stools, constipation, diarrhea, nausea, vomiting, or early satiety. Denies pain with intercourse, dysuria, frequency, hematuria or incontinence. Denies hot flashes, pelvic pain, vaginal bleeding or vaginal discharge.   Denies joint pain, back pain or muscle pain/cramps. Denies itching, rash, or wounds. Denies dizziness, headaches, numbness or seizures. Denies swollen lymph nodes or glands, denies easy bruising or bleeding. Denies anxiety, depression, confusion, or decreased concentration.  Physical Exam: BP 119/68 (BP Location: Left Arm, Patient Position: Sitting)   Pulse 93   Temp 97.9 F (36.6  C) (Temporal)   Resp 17   Ht 5\' 5"  (1.651 m)   Wt 154 lb 1.6 oz (69.9 kg)   SpO2 97%   BMI 25.64 kg/m  General: Alert, oriented, no acute distress. HEENT: Atraumatic, normocephalic, sclera anicteric. Chest: Unlabored breathing on room air. GU: External female genitalia and notable for erythema spanning the inner portion of the patient's labia on the left, measuring approximately 3 cm, and a smaller similarly erythematous portion on the right measuring 1-2 cm.  There is mild tenderness with palpation over this area.  No hyper pigmentation or lesions noted otherwise.  No significant change since last visit.  No discharge or bleeding.  Laboratory & Radiologic Studies: None new.  Assessment & Plan: [name] is a 79 y.o. 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    The patient's exam is unchanged since her last visit.  She has felt some improvement of her irritation since stopping both the Desitin and steroid cream.  I suggested that we use lidocaine jelly as needed for irritation but otherwise dose 6 weeks without using cream to treat this erythematous area.  When she comes back, if the irritation is persistent, I discussed recommendation to proceed with a biopsy.  If she has had some improvement, then we can continue with close surveillance.  20 minutes of total time was spent for this patient encounter, including preparation, face-to-face counseling with the patient and coordination of care, and documentation of the encounter.  Jeral Pinch, MD  Division of Gynecologic Oncology  Department of Obstetrics and Gynecology  [facility]

## 2019-04-21 ENCOUNTER — Inpatient Hospital Stay: Payer: Medicare Other | Attending: Gynecologic Oncology | Admitting: Gynecologic Oncology

## 2019-04-21 ENCOUNTER — Other Ambulatory Visit: Payer: Self-pay

## 2019-04-21 ENCOUNTER — Other Ambulatory Visit: Payer: Self-pay | Admitting: Gynecologic Oncology

## 2019-04-21 ENCOUNTER — Encounter: Payer: Self-pay | Admitting: Gynecologic Oncology

## 2019-04-21 VITALS — BP 119/68 | HR 93 | Temp 97.9°F | Resp 17 | Ht 65.0 in | Wt 154.1 lb

## 2019-04-21 DIAGNOSIS — N9089 Other specified noninflammatory disorders of vulva and perineum: Secondary | ICD-10-CM | POA: Insufficient documentation

## 2019-04-21 DIAGNOSIS — C519 Malignant neoplasm of vulva, unspecified: Secondary | ICD-10-CM | POA: Insufficient documentation

## 2019-04-21 DIAGNOSIS — Z90722 Acquired absence of ovaries, bilateral: Secondary | ICD-10-CM | POA: Insufficient documentation

## 2019-04-21 DIAGNOSIS — Z90710 Acquired absence of both cervix and uterus: Secondary | ICD-10-CM | POA: Diagnosis not present

## 2019-04-21 MED ORDER — LIDOCAINE HCL 2.5 % EX GEL: 1.0000 "application " | Freq: Three times a day (TID) | CUTANEOUS | 2 refills | Status: DC | PRN

## 2019-04-21 NOTE — Patient Instructions (Addendum)
I will see you in 6 weeks.  Use the lidocaine gel as needed after voiding.  Please call if your symptoms worsen prior to your next visit with me at [phone].

## 2019-05-01 ENCOUNTER — Other Ambulatory Visit: Payer: Self-pay

## 2019-05-01 DIAGNOSIS — I998 Other disorder of circulatory system: Secondary | ICD-10-CM

## 2019-05-15 ENCOUNTER — Ambulatory Visit
Admission: RE | Admit: 2019-05-15 | Discharge: 2019-05-15 | Disposition: A | Discharge status: Home / Self Care | Payer: Medicare Other | Source: Ambulatory Visit | Attending: Surgery | Admitting: Surgery

## 2019-05-15 ENCOUNTER — Other Ambulatory Visit: Payer: Self-pay

## 2019-05-15 DIAGNOSIS — I998 Other disorder of circulatory system: Secondary | ICD-10-CM

## 2019-05-15 MED ORDER — IOPAMIDOL (ISOVUE-370) INJECTION 76%
75.0000 mL | Freq: Once | INTRAVENOUS | Status: AC | PRN
  Administered 2019-05-15: 75 mL via INTRAVENOUS

## 2019-05-22 ENCOUNTER — Ambulatory Visit: Payer: Medicare Other | Admitting: Surgery

## 2019-05-29 NOTE — Progress Notes (Deleted)
Gynecologic Oncology Return Clinic Visit  ***  Reason for Visit: ***  Treatment History: [name] is a 79 year old P3 who was seen in consultation at the request of Dr. 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 The patient reported experiencing a palpable lump in the winter 2019 and 2020. This is on the anterior portion of her vulva. She put off seeing physician for this because she reported that she was busy. It became more noticeable in April 2020 and she attempted to have an appointment with a gynecologist however she could not get an appointment due to the the coronavirus shot downs. She was eventually able to see a physician on August 25, 2018 at which time evaluation of the vulva confirmed a vulvar mass. 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  The patient's medical history is significant for a remote history of cervical cancer diagnosed in 2008. 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 Marti Sleigh. She has had no other prior abdominal surgeries. She is an ex-smoker and quit in October 2004. She is a nondrinker. She denies urinary incontinence. Her most significant medical comorbidities right now is trigeminal neuralgia. She has had gamma knife radiation to treat this.  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 She was taken to the OR on 09/27/18 with Dr Alycia Rossetti for a radical anterior vulvectomy. 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 The resection margins were negative for carcinoma with the closest being the lateral margin 1:00 at 0.8 cm. The carcinoma invaded 1.3 cm depth. There was no lymphovascular or perineural invasion identified.  Postoperatively a PET/CT was obtained on October 06, 2018. This revealed postoperative changes to the vulva. There was no hypermetabolic local regional lymphadenopathy. There is a  questionable subtle focus of hypermetabolism in the left lobe of the liver, indeterminate for liver metastases. MRI abdomen could be performed for further work-up. No other potential findings of hypermetabolic distant metastatic disease were seen.  On 10/25/2018, the patient underwent bilateral superficial inguinal lymphadenectomy with fibroadipose tissue noted on the left and no carcinoma seen in 2 of 2 lymph nodes on the right.  Interval History: ***  Past Medical/Surgical History: Past Medical History:  Diagnosis Date  . Complication of anesthesia    slow to wake up /  body temperature goes down   warm blankets  . Hyperlipidemia   . Hypertension   . Hypothyroidism   . Pneumonia   . PONV (postoperative nausea and vomiting)   . 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 ABDOMINAL HYSTERECTOMY     Pelvic lymphadenectomy  . BREAST SURGERY     left breast cyst in areolar area 46 years ago  . LYMPH NODE DISSECTION Bilateral 10/25/2018   Procedure: LYMPH NODE DISSECTION,BILATERAL INGUINOFEMORAL LYMPHADECTOMY;  Surgeon: [name], MD;  Location: [facility];  Service: Gynecology;  Laterality: Bilateral;  . RADICAL VULVECTOMY N/A 09/27/2018   Procedure: RADICAL VULVECTOMY;  Surgeon: [name], MD;  Location: [facility];  Service: Gynecology;  Laterality: N/A;  . SUPRAPUBIC CATHETER PLACEMENT      Family History  Problem Relation Age of Onset  . Skin cancer Father     Social History   Socioeconomic History  . Marital status: Widowed    Spouse name: Not on file  . Number of children: Not on file  . Years  of education: Not on file  . Highest education level: Not on file  Occupational History  . Not on file  Tobacco Use  . Smoking status: Former Smoker    Quit date: 12/08/2003    Years since quitting: 15.4  . Smokeless tobacco: Never Used  Substance and Sexual Activity  . Alcohol use: No  . Drug use:  No  . Sexual activity: Not Currently  Other Topics Concern  . Not on file  Social History Narrative  . Not on file   Social Determinants of Health   Financial Resource Strain:   . Difficulty of Paying Living Expenses:   Food Insecurity:   . Worried About Charity fundraiser in the Last Year:   . Arboriculturist in the Last Year:   Transportation Needs:   . Film/video editor (Medical):   Marland Kitchen Lack of Transportation (Non-Medical):   Physical Activity:   . Days of Exercise per Week:   . Minutes of Exercise per Session:   Stress:   . Feeling of Stress :   Social Connections:   . Frequency of Communication with Friends and Family:   . Frequency of Social Gatherings with Friends and Family:   . Attends Religious Services:   . Active Member of Clubs or Organizations:   . Attends Archivist Meetings:   Marland Kitchen Marital Status:     Current Medications:  Current Outpatient Medications:  .  albuterol (VENTOLIN HFA) 108 (90 Base) MCG/ACT inhaler, Inhale 2 puffs into the lungs every 6 (six) hours as needed for wheezing or shortness of breath., Disp: , Rfl:  .  aspirin 81 MG chewable tablet, Chew 81 mg by mouth daily., Disp: , Rfl:  .  cholecalciferol (VITAMIN D3) 25 MCG (1000 UT) tablet, Take 2,000 Units by mouth every evening., Disp: , Rfl:  .  clotrimazole (LOTRIMIN) 1 % cream, Apply to affected area twice a day and then decrease to once a day as irritation decreases., Disp: 60 g, Rfl: 3 .  hydrochlorothiazide (HYDRODIURIL) 25 MG tablet, Take 12.5 mg by mouth daily. , Disp: , Rfl:  .  levothyroxine (SYNTHROID) 75 MCG tablet, Take 75 mcg by mouth daily before breakfast., Disp: , Rfl:  .  lidocaine (XYLOCAINE) 5 % ointment, APPLY TO AFFECTED AREA 3 TIMES A DAY AS NEEDED (TO VULVA), Disp: 1 g, Rfl: 0 .  magnesium hydroxide (MILK OF MAGNESIA) 400 MG/5ML suspension, Take 30 mLs by mouth daily as needed for constipation., Disp: , Rfl:  .  Omega-3 Fatty Acids (FISH OIL) 1200 MG CAPS,  Take 1,200 mg by mouth 5 (five) times daily., Disp: , Rfl:  .  Oxcarbazepine (TRILEPTAL) 300 MG tablet, Take 150 mg by mouth 2 (two) times daily as needed (Trigeminal neuralgia). , Disp: , Rfl:  .  rosuvastatin (CRESTOR) 5 MG tablet, Take 5 mg by mouth at bedtime., Disp: , Rfl:   Review of Systems: Denies appetite changes, fevers, chills, fatigue, unexplained weight changes. Denies hearing loss, neck lumps or masses, mouth sores, ringing in ears or voice changes. Denies cough or wheezing.  Denies shortness of breath. Denies chest pain or palpitations. Denies leg swelling. Denies abdominal distention, pain, blood in stools, constipation, diarrhea, nausea, vomiting, or early satiety. Denies pain with intercourse, dysuria, frequency, hematuria or incontinence. Denies hot flashes, pelvic pain, vaginal bleeding or vaginal discharge.   Denies joint pain, back pain or muscle pain/cramps. Denies itching, rash, or wounds. Denies dizziness, headaches, numbness or seizures. Denies swollen  lymph nodes or glands, denies easy bruising or bleeding. Denies anxiety, depression, confusion, or decreased concentration.  Physical Exam: There were no vitals taken for this visit. General: ***Alert, oriented, no acute distress. HEENT: ***Posterior oropharynx clear, sclera anicteric. Chest: ***Clear to auscultation bilaterally.  ***Port site clean. Cardiovascular: ***Regular rate and rhythm, no murmurs. Abdomen: ***Obese, soft, nontender.  Normoactive bowel sounds.  No masses or hepatosplenomegaly appreciated.  ***Well-healed scar. Extremities: ***Grossly normal range of motion.  Warm, well perfused.  No edema bilaterally. Skin: ***No rashes or lesions noted. Lymphatics: ***No cervical, supraclavicular, or inguinal adenopathy. GU: Normal appearing external genitalia without erythema, excoriation, or lesions.  Speculum exam reveals ***.  Bimanual exam reveals ***.  ***Rectovaginal exam  confirms  ___.  Laboratory & Radiologic Studies: ***  Assessment & Plan: Calvary Streett is a 79 y.o. woman with Stage *** who presents for ***.  ***  *** minutes of total time was spent for this patient encounter, including preparation, face-to-face counseling with the patient and coordination of care, and documentation of the encounter.  Jeral Pinch, MD  Division of Gynecologic Oncology  Department of Obstetrics and Gynecology  [facility]

## 2019-06-01 ENCOUNTER — Other Ambulatory Visit: Payer: Self-pay

## 2019-06-01 ENCOUNTER — Inpatient Hospital Stay: Payer: Medicare Other | Attending: Gynecologic Oncology | Admitting: Gynecologic Oncology

## 2019-06-01 ENCOUNTER — Ambulatory Visit: Payer: Medicare Other | Admitting: Gynecologic Oncology

## 2019-06-01 ENCOUNTER — Encounter: Payer: Self-pay | Admitting: Gynecologic Oncology

## 2019-06-01 VITALS — BP 116/71 | HR 70 | Temp 98.2°F | Resp 18 | Ht 65.0 in | Wt 160.5 lb

## 2019-06-01 DIAGNOSIS — L292 Pruritus vulvae: Secondary | ICD-10-CM | POA: Diagnosis not present

## 2019-06-01 DIAGNOSIS — Z87891 Personal history of nicotine dependence: Secondary | ICD-10-CM | POA: Diagnosis not present

## 2019-06-01 DIAGNOSIS — N9089 Other specified noninflammatory disorders of vulva and perineum: Secondary | ICD-10-CM | POA: Diagnosis present

## 2019-06-01 DIAGNOSIS — N9489 Other specified conditions associated with female genital organs and menstrual cycle: Secondary | ICD-10-CM | POA: Diagnosis not present

## 2019-06-01 DIAGNOSIS — E039 Hypothyroidism, unspecified: Secondary | ICD-10-CM | POA: Insufficient documentation

## 2019-06-01 DIAGNOSIS — D069 Carcinoma in situ of cervix, unspecified: Secondary | ICD-10-CM | POA: Diagnosis not present

## 2019-06-01 DIAGNOSIS — I10 Essential (primary) hypertension: Secondary | ICD-10-CM | POA: Diagnosis not present

## 2019-06-01 DIAGNOSIS — Z90710 Acquired absence of both cervix and uterus: Secondary | ICD-10-CM | POA: Diagnosis not present

## 2019-06-01 DIAGNOSIS — Z79899 Other long term (current) drug therapy: Secondary | ICD-10-CM | POA: Diagnosis not present

## 2019-06-01 DIAGNOSIS — E785 Hyperlipidemia, unspecified: Secondary | ICD-10-CM | POA: Insufficient documentation

## 2019-06-01 DIAGNOSIS — C519 Malignant neoplasm of vulva, unspecified: Secondary | ICD-10-CM | POA: Insufficient documentation

## 2019-06-01 DIAGNOSIS — Z90722 Acquired absence of ovaries, bilateral: Secondary | ICD-10-CM | POA: Insufficient documentation

## 2019-06-01 NOTE — Addendum Note (Signed)
Addended by: Joylene John D on: 06/01/2019 03:34 PM   Modules accepted: Orders

## 2019-06-01 NOTE — Patient Instructions (Signed)
I will call you on Monday with your biopsy results.  We will make a plan at that time depending on the findings.

## 2019-06-01 NOTE — Progress Notes (Signed)
Gynecologic Oncology Return Clinic Visit  3/25  Reason for Visit: follow-up vulvar symptoms  Treatment History: Ms. Melina Roskos is a 79 year old P3 who was seen in consultation at the request of Dr. 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 The patient reported experiencing a palpable lump in the winter 2019 and 2020. This is on the anterior portion of her vulva. She put off seeing physician for this because she reported that she was busy. It became more noticeable in April 2020 and she attempted to have an appointment with a gynecologist however she could not get an appointment due to the the coronavirus shot downs. She was eventually able to see a physician on August 25, 2018 at which time evaluation of the vulva confirmed a vulvar mass. 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  The patient's medical history is significant for a remote history of cervical cancer diagnosed in 2008. 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 Marti Sleigh. She has had no other prior abdominal surgeries. She is an ex-smoker and quit in October 2004. She is a nondrinker. She denies urinary incontinence. Her most significant medical comorbidities right now is trigeminal neuralgia. She has had gamma knife radiation to treat this.  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 She was taken to the OR on 09/27/18 with Dr Alycia Rossetti for a radical anterior vulvectomy. 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 The resection margins were negative for carcinoma with the closest being the lateral margin 1:00 at 0.8 cm. The carcinoma invaded 1.3 cm depth. There was no lymphovascular or perineural invasion identified.  Postoperatively a PET/CT was obtained on October 06, 2018. This revealed postoperative changes to the vulva. There was no hypermetabolic local regional  lymphadenopathy. There is a questionable subtle focus of hypermetabolism in the left lobe of the liver, indeterminate for liver metastases. MRI abdomen could be performed for further work-up. No other potential findings of hypermetabolic distant metastatic disease were seen.  On 10/25/2018, the patient underwent bilateral superficial inguinal lymphadenectomy with fibroadipose tissue noted on the left and no carcinoma seen in 2 of 2 lymph nodes on the right.  The patient was seen in November with vulvar irritation that was intermittent in nature.  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  She was started on triamcinolone twice a day until her symptoms improved and then transition to daily.  I saw her at the end of December and she felt some improvement in her vulvar tenderness but still had significant erythema and irritation of the vulva.  I asked her to add Desitin or similar barrier cream.  When I saw her again in February, she had no improvement and had had significant irritation with the Desitin.  We stopped all topical treatment together.  Interval History: The patient denies any significant change since I saw her in February.  She continues to have vulvar irritation and burning.  She has not used any topical treatment since her visit.  She continues to use a squirt bottle to rinse off after urinating.  She reports normal urinary and bowel function.  She denies any vaginal bleeding or discharge.  Past Medical/Surgical History: Past Medical History:  Diagnosis Date  . Complication of anesthesia    slow to wake up /  body temperature goes down   warm blankets  . Hyperlipidemia   . Hypertension   .  Hypothyroidism   . Pneumonia   . PONV (postoperative nausea and vomiting)   . 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 ABDOMINAL HYSTERECTOMY     Pelvic  lymphadenectomy  . BREAST SURGERY     left breast cyst in areolar area 46 years ago  . LYMPH NODE DISSECTION Bilateral 10/25/2018   Procedure: LYMPH NODE DISSECTION,BILATERAL INGUINOFEMORAL LYMPHADECTOMY;  Surgeon: [name], MD;  Location: [facility];  Service: Gynecology;  Laterality: Bilateral;  . RADICAL VULVECTOMY N/A 09/27/2018   Procedure: RADICAL VULVECTOMY;  Surgeon: [name], MD;  Location: [facility];  Service: Gynecology;  Laterality: N/A;  . SUPRAPUBIC CATHETER PLACEMENT      Family History  Problem Relation Age of Onset  . Skin cancer Father     Social History   Socioeconomic History  . Marital status: Widowed    Spouse name: Not on file  . Number of children: Not on file  . Years of education: Not on file  . Highest education level: Not on file  Occupational History  . Not on file  Tobacco Use  . Smoking status: Former Smoker    Quit date: 12/08/2003    Years since quitting: 15.4  . Smokeless tobacco: Never Used  Substance and Sexual Activity  . Alcohol use: No  . Drug use: No  . Sexual activity: Not Currently  Other Topics Concern  . Not on file  Social History Narrative  . Not on file   Social Determinants of Health   Financial Resource Strain:   . Difficulty of Paying Living Expenses:   Food Insecurity:   . Worried About Charity fundraiser in the Last Year:   . Arboriculturist in the Last Year:   Transportation Needs:   . Film/video editor (Medical):   Marland Kitchen Lack of Transportation (Non-Medical):   Physical Activity:   . Days of Exercise per Week:   . Minutes of Exercise per Session:   Stress:   . Feeling of Stress :   Social Connections:   . Frequency of Communication with Friends and Family:   . Frequency of Social Gatherings with Friends and Family:   . Attends Religious Services:   . Active Member of Clubs or Organizations:   . Attends Archivist Meetings:   Marland Kitchen Marital Status:     Current Medications:  Current Outpatient  Medications:  .  albuterol (VENTOLIN HFA) 108 (90 Base) MCG/ACT inhaler, Inhale 2 puffs into the lungs every 6 (six) hours as needed for wheezing or shortness of breath., Disp: , Rfl:  .  aspirin 81 MG chewable tablet, Chew 81 mg by mouth daily., Disp: , Rfl:  .  cholecalciferol (VITAMIN D3) 25 MCG (1000 UT) tablet, Take 2,000 Units by mouth every evening., Disp: , Rfl:  .  clotrimazole (LOTRIMIN) 1 % cream, Apply to affected area twice a day and then decrease to once a day as irritation decreases., Disp: 60 g, Rfl: 3 .  hydrochlorothiazide (HYDRODIURIL) 25 MG tablet, Take 12.5 mg by mouth daily. , Disp: , Rfl:  .  levothyroxine (SYNTHROID) 75 MCG tablet, Take 75 mcg by mouth daily before breakfast., Disp: , Rfl:  .  lidocaine (XYLOCAINE) 5 % ointment, APPLY TO AFFECTED AREA 3 TIMES A DAY AS NEEDED (TO VULVA), Disp: 1 g, Rfl: 0 .  magnesium hydroxide (MILK OF MAGNESIA) 400 MG/5ML suspension, Take 30 mLs by  mouth daily as needed for constipation., Disp: , Rfl:  .  Omega-3 Fatty Acids (FISH OIL) 1200 MG CAPS, Take 1,200 mg by mouth 5 (five) times daily., Disp: , Rfl:  .  Oxcarbazepine (TRILEPTAL) 300 MG tablet, Take 150 mg by mouth 2 (two) times daily as needed (Trigeminal neuralgia). , Disp: , Rfl:  .  rosuvastatin (CRESTOR) 5 MG tablet, Take 5 mg by mouth at bedtime., Disp: , Rfl:   Review of Systems: Denies appetite changes, fevers, chills, fatigue, unexplained weight changes. Denies hearing loss, neck lumps or masses, mouth sores, ringing in ears or voice changes. Denies cough or wheezing.  Denies shortness of breath. Denies chest pain or palpitations. Denies leg swelling. Denies abdominal distention, pain, blood in stools, constipation, diarrhea, nausea, vomiting, or early satiety. Denies pain with intercourse, dysuria, frequency, hematuria or incontinence. Denies hot flashes, pelvic pain, vaginal bleeding or vaginal discharge.   Denies joint pain, back pain or muscle pain/cramps. Denies  itching, rash, or wounds. Denies dizziness, headaches, numbness or seizures. Denies swollen lymph nodes or glands, denies easy bruising or bleeding. Denies anxiety, depression, confusion, or decreased concentration.  Physical Exam: BP 116/71 (BP Location: Left Arm, Patient Position: Sitting)   Pulse 70   Temp 98.2 F (36.8 C) (Temporal)   Resp 18   Ht 5\' 5"  (1.651 m)   Wt 160 lb 8 oz (72.8 kg)   SpO2 97%   BMI 26.71 kg/m  General: Alert, oriented, no acute distress. HEENT: Normocephalic, atraumatic, sclera anicteric. Chest: Unlabored breathing on room air. GU: External exam notable for erythema surrounding the vaginal introitus and extending into the external vagina, tender to the touch.  No associated bleeding or discharge.  Significantly more erythema noted on the left than the right with appearance of desquamation.  No significant friability or atypical vasculature.  Vulvar biopsy: With the patient's verbal consent, the vulva was cleansed with Betadine x3.  Approximately 2 cc of 1% lidocaine was injected for local anesthesia.  2 punch biopsies, 3 mm, were then taken at approximately 4:00 on the internal surface of the labia, at the junction between the erythematous and desquamative portion and the normal-appearing skin.  The patient tolerated the procedure well.  Laboratory & Radiologic Studies: None new  Assessment & Plan: Markaila Pecha is a 79 y.o. 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   Given continued symptoms as well as appearance of the vulva (picture taken today and in media), I recommended a vulvar biopsy which was performed today without difficulty.  The appearance of her vulva is not consistent with recurrent cancer, but she is not responded to treatment for presumed diagnosis.  I am hoping that the biopsy will definitively rule out recurrent cancer and may  give me some direction in terms of therapy.  I will call the patient back on Monday with biopsy results and next steps in treatment.  15 minutes of total time was spent for this patient encounter, including preparation, face-to-face counseling with the patient and coordination of care, and documentation of the encounter.  Jeral Pinch, MD  Division of Gynecologic Oncology  Department of Obstetrics and Gynecology  [facility]

## 2019-06-01 NOTE — H&P (View-Only) (Signed)
Gynecologic Oncology Return Clinic Visit  3/25  Reason for Visit: follow-up vulvar symptoms  Treatment History: [name] is a 79 year old P3 who was seen in consultation at the request of Dr. 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 The patient reported experiencing a palpable lump in the winter 2019 and 2020. This is on the anterior portion of her vulva. She put off seeing physician for this because she reported that she was busy. It became more noticeable in April 2020 and she attempted to have an appointment with a gynecologist however she could not get an appointment due to the the coronavirus shot downs. She was eventually able to see a physician on August 25, 2018 at which time evaluation of the vulva confirmed a vulvar mass. 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  The patient's medical history is significant for a remote history of cervical cancer diagnosed in 2008. 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 Marti Sleigh. She has had no other prior abdominal surgeries. She is an ex-smoker and quit in October 2004. She is a nondrinker. She denies urinary incontinence. Her most significant medical comorbidities right now is trigeminal neuralgia. She has had gamma knife radiation to treat this.  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 She was taken to the OR on 09/27/18 with Dr Alycia Rossetti for a radical anterior vulvectomy. 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 The resection margins were negative for carcinoma with the closest being the lateral margin 1:00 at 0.8 cm. The carcinoma invaded 1.3 cm depth. There was no lymphovascular or perineural invasion identified.  Postoperatively a PET/CT was obtained on October 06, 2018. This revealed postoperative changes to the vulva. There was no hypermetabolic local regional  lymphadenopathy. There is a questionable subtle focus of hypermetabolism in the left lobe of the liver, indeterminate for liver metastases. MRI abdomen could be performed for further work-up. No other potential findings of hypermetabolic distant metastatic disease were seen.  On 10/25/2018, the patient underwent bilateral superficial inguinal lymphadenectomy with fibroadipose tissue noted on the left and no carcinoma seen in 2 of 2 lymph nodes on the right.  The patient was seen in November with vulvar irritation that was intermittent in nature.  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  She was started on triamcinolone twice a day until her symptoms improved and then transition to daily.  I saw her at the end of December and she felt some improvement in her vulvar tenderness but still had significant erythema and irritation of the vulva.  I asked her to add Desitin or similar barrier cream.  When I saw her again in February, she had no improvement and had had significant irritation with the Desitin.  We stopped all topical treatment together.  Interval History: The patient denies any significant change since I saw her in February.  She continues to have vulvar irritation and burning.  She has not used any topical treatment since her visit.  She continues to use a squirt bottle to rinse off after urinating.  She reports normal urinary and bowel function.  She denies any vaginal bleeding or discharge.  Past Medical/Surgical History: Past Medical History:  Diagnosis Date  . Complication of anesthesia    slow to wake up /  body temperature goes down   warm blankets  . Hyperlipidemia   . Hypertension   .  Hypothyroidism   . Pneumonia   . PONV (postoperative nausea and vomiting)   . 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 ABDOMINAL HYSTERECTOMY     Pelvic  lymphadenectomy  . BREAST SURGERY     left breast cyst in areolar area 46 years ago  . LYMPH NODE DISSECTION Bilateral 10/25/2018   Procedure: LYMPH NODE DISSECTION,BILATERAL INGUINOFEMORAL LYMPHADECTOMY;  Surgeon: [name], MD;  Location: [facility];  Service: Gynecology;  Laterality: Bilateral;  . RADICAL VULVECTOMY N/A 09/27/2018   Procedure: RADICAL VULVECTOMY;  Surgeon: [name], MD;  Location: [facility];  Service: Gynecology;  Laterality: N/A;  . SUPRAPUBIC CATHETER PLACEMENT      Family History  Problem Relation Age of Onset  . Skin cancer Father     Social History   Socioeconomic History  . Marital status: Widowed    Spouse name: Not on file  . Number of children: Not on file  . Years of education: Not on file  . Highest education level: Not on file  Occupational History  . Not on file  Tobacco Use  . Smoking status: Former Smoker    Quit date: 12/08/2003    Years since quitting: 15.4  . Smokeless tobacco: Never Used  Substance and Sexual Activity  . Alcohol use: No  . Drug use: No  . Sexual activity: Not Currently  Other Topics Concern  . Not on file  Social History Narrative  . Not on file   Social Determinants of Health   Financial Resource Strain:   . Difficulty of Paying Living Expenses:   Food Insecurity:   . Worried About Charity fundraiser in the Last Year:   . Arboriculturist in the Last Year:   Transportation Needs:   . Film/video editor (Medical):   Marland Kitchen Lack of Transportation (Non-Medical):   Physical Activity:   . Days of Exercise per Week:   . Minutes of Exercise per Session:   Stress:   . Feeling of Stress :   Social Connections:   . Frequency of Communication with Friends and Family:   . Frequency of Social Gatherings with Friends and Family:   . Attends Religious Services:   . Active Member of Clubs or Organizations:   . Attends Archivist Meetings:   Marland Kitchen Marital Status:     Current Medications:  Current Outpatient  Medications:  .  albuterol (VENTOLIN HFA) 108 (90 Base) MCG/ACT inhaler, Inhale 2 puffs into the lungs every 6 (six) hours as needed for wheezing or shortness of breath., Disp: , Rfl:  .  aspirin 81 MG chewable tablet, Chew 81 mg by mouth daily., Disp: , Rfl:  .  cholecalciferol (VITAMIN D3) 25 MCG (1000 UT) tablet, Take 2,000 Units by mouth every evening., Disp: , Rfl:  .  clotrimazole (LOTRIMIN) 1 % cream, Apply to affected area twice a day and then decrease to once a day as irritation decreases., Disp: 60 g, Rfl: 3 .  hydrochlorothiazide (HYDRODIURIL) 25 MG tablet, Take 12.5 mg by mouth daily. , Disp: , Rfl:  .  levothyroxine (SYNTHROID) 75 MCG tablet, Take 75 mcg by mouth daily before breakfast., Disp: , Rfl:  .  lidocaine (XYLOCAINE) 5 % ointment, APPLY TO AFFECTED AREA 3 TIMES A DAY AS NEEDED (TO VULVA), Disp: 1 g, Rfl: 0 .  magnesium hydroxide (MILK OF MAGNESIA) 400 MG/5ML suspension, Take 30 mLs by  mouth daily as needed for constipation., Disp: , Rfl:  .  Omega-3 Fatty Acids (FISH OIL) 1200 MG CAPS, Take 1,200 mg by mouth 5 (five) times daily., Disp: , Rfl:  .  Oxcarbazepine (TRILEPTAL) 300 MG tablet, Take 150 mg by mouth 2 (two) times daily as needed (Trigeminal neuralgia). , Disp: , Rfl:  .  rosuvastatin (CRESTOR) 5 MG tablet, Take 5 mg by mouth at bedtime., Disp: , Rfl:   Review of Systems: Denies appetite changes, fevers, chills, fatigue, unexplained weight changes. Denies hearing loss, neck lumps or masses, mouth sores, ringing in ears or voice changes. Denies cough or wheezing.  Denies shortness of breath. Denies chest pain or palpitations. Denies leg swelling. Denies abdominal distention, pain, blood in stools, constipation, diarrhea, nausea, vomiting, or early satiety. Denies pain with intercourse, dysuria, frequency, hematuria or incontinence. Denies hot flashes, pelvic pain, vaginal bleeding or vaginal discharge.   Denies joint pain, back pain or muscle pain/cramps. Denies  itching, rash, or wounds. Denies dizziness, headaches, numbness or seizures. Denies swollen lymph nodes or glands, denies easy bruising or bleeding. Denies anxiety, depression, confusion, or decreased concentration.  Physical Exam: BP 116/71 (BP Location: Left Arm, Patient Position: Sitting)   Pulse 70   Temp 98.2 F (36.8 C) (Temporal)   Resp 18   Ht 5\' 5"  (1.651 m)   Wt 160 lb 8 oz (72.8 kg)   SpO2 97%   BMI 26.71 kg/m  General: Alert, oriented, no acute distress. HEENT: Normocephalic, atraumatic, sclera anicteric. Chest: Unlabored breathing on room air. GU: External exam notable for erythema surrounding the vaginal introitus and extending into the external vagina, tender to the touch.  No associated bleeding or discharge.  Significantly more erythema noted on the left than the right with appearance of desquamation.  No significant friability or atypical vasculature.  Vulvar biopsy: With the patient's verbal consent, the vulva was cleansed with Betadine x3.  Approximately 2 cc of 1% lidocaine was injected for local anesthesia.  2 punch biopsies, 3 mm, were then taken at approximately 4:00 on the internal surface of the labia, at the junction between the erythematous and desquamative portion and the normal-appearing skin.  The patient tolerated the procedure well.  Laboratory & Radiologic Studies: None new  Assessment & Plan: [name] is a 79 y.o. 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   Given continued symptoms as well as appearance of the vulva (picture taken today and in media), I recommended a vulvar biopsy which was performed today without difficulty.  The appearance of her vulva is not consistent with recurrent cancer, but she is not responded to treatment for presumed diagnosis.  I am hoping that the biopsy will definitively rule out recurrent cancer and may  give me some direction in terms of therapy.  I will call the patient back on Monday with biopsy results and next steps in treatment.  15 minutes of total time was spent for this patient encounter, including preparation, face-to-face counseling with the patient and coordination of care, and documentation of the encounter.  Jeral Pinch, MD  Division of Gynecologic Oncology  Department of Obstetrics and Gynecology  [facility]

## 2019-06-05 ENCOUNTER — Telehealth: Payer: Self-pay | Admitting: Gynecologic Oncology

## 2019-06-05 LAB — SURGICAL PATHOLOGY

## 2019-06-05 NOTE — Telephone Encounter (Signed)
Call the patient with her vulvar biopsy results from last week, which show high-grade dysplasia.  Discussed the possible treatment options including surgery (excision versus laser) or topical treatment with Aldara.  I am concerned that given the extent of erythema that excision would be difficult and that the patient may not be able to apply Aldara to the entire area.  Thus, I favor laser ablation.  The patient is amenable to this.  We will look to the morning of the sixth or the eighth (the patient has appointment on April 5 and 7) for exam under anesthesia, possible vulvar biopsies, vulvar laser ablation, possible vulvar excisions, and any other indicated procedures.  If there are any areas of the time of surgery that are concerning for cancer, then I will biopsy these and we will do a frozen section.  I have asked Melissa cross to call the patient tomorrow to set up final plan for date of surgery.  Jeral Pinch MD Gynecologic Oncology

## 2019-06-06 ENCOUNTER — Telehealth: Payer: Self-pay

## 2019-06-06 NOTE — Telephone Encounter (Signed)
Told Ms Castaneda that Dr. Berline Lopes will perform the surgery on 06-15-19 at [facility]. She will receive a call from the surgery to discuss pre-op instructions and covid testing. She can continue her daily ASA 81 mg. Recommended that she p/u frozen peas and put some in a snack bag and back into the freezer so she can toss them out after placing on her incision.   Pt has tramadol 50 mg tabs #10 from last procedure that she did not use so she will use these if needed for pain post Op.  Joylene John, NP notified.

## 2019-06-08 ENCOUNTER — Other Ambulatory Visit: Payer: Self-pay

## 2019-06-08 ENCOUNTER — Encounter (HOSPITAL_BASED_OUTPATIENT_CLINIC_OR_DEPARTMENT_OTHER): Payer: Self-pay | Admitting: Gynecologic Oncology

## 2019-06-08 NOTE — Progress Notes (Addendum)
Spoke w/ via phone for pre-op interview---[facility] needs dos----    none         Lab results------has lab appt for cbc, bmet 06-12-2019 at 100 pm, ekg 09-23-2019 epic, lov vascular sees dr Trula Slade 06-12-2019 COVID test ------06-12-2019 at 200 pm Arrive at -------650 am 06-15-2019 No food after midnight, clear liquids from midnight until 530 am then npo Medications to take morning of surgery -----levothyroxine, albuterol inhaler prn/bring inhaler Diabetic medication ----n/a- Patient Special Instructions -----patient staying on 81 mg aspirin per dr Berline Lopes pre-Op special Istructions -----none Patient verbalized understanding of instructions that were given at this phone interview. Patient denies shortness of breath, chest pain, fever, cough a this phone interview.

## 2019-06-12 ENCOUNTER — Other Ambulatory Visit (HOSPITAL_COMMUNITY)
Admission: RE | Admit: 2019-06-12 | Discharge: 2019-06-12 | Disposition: A | Discharge status: Home / Self Care | Payer: Medicare Other | Source: Ambulatory Visit | Attending: Gynecologic Oncology | Admitting: Gynecologic Oncology

## 2019-06-12 ENCOUNTER — Other Ambulatory Visit: Payer: Self-pay

## 2019-06-12 ENCOUNTER — Ambulatory Visit (INDEPENDENT_AMBULATORY_CARE_PROVIDER_SITE_OTHER): Payer: Medicare Other | Admitting: Surgery

## 2019-06-12 ENCOUNTER — Encounter: Payer: Self-pay | Admitting: Surgery

## 2019-06-12 ENCOUNTER — Encounter (HOSPITAL_COMMUNITY)
Admission: RE | Admit: 2019-06-12 | Discharge: 2019-06-12 | Disposition: A | Discharge status: Home / Self Care | Payer: Medicare Other | Source: Ambulatory Visit | Attending: Gynecologic Oncology | Admitting: Gynecologic Oncology

## 2019-06-12 DIAGNOSIS — Z20822 Contact with and (suspected) exposure to covid-19: Secondary | ICD-10-CM | POA: Diagnosis not present

## 2019-06-12 DIAGNOSIS — Z01812 Encounter for preprocedural laboratory examination: Secondary | ICD-10-CM | POA: Diagnosis not present

## 2019-06-12 DIAGNOSIS — I728 Aneurysm of other specified arteries: Secondary | ICD-10-CM | POA: Diagnosis not present

## 2019-06-12 LAB — BASIC METABOLIC PANEL
Anion gap: 7 (ref 5–15)
BUN: 15 mg/dL (ref 8–23)
CO2: 27 mmol/L (ref 22–32)
Calcium: 9.9 mg/dL (ref 8.9–10.3)
Chloride: 106 mmol/L (ref 98–111)
Creatinine, Ser: 0.77 mg/dL (ref 0.44–1.00)
GFR calc Af Amer: >60 mL/min (ref >60)
GFR calc non Af Amer: >60 mL/min (ref >60)
Glucose, Bld: 96 mg/dL (ref 70–99)
Potassium: 4.7 mmol/L (ref 3.5–5.1)
Sodium: 140 mmol/L (ref 135–145)

## 2019-06-12 LAB — CBC
HCT: 41.4 % (ref 36.0–46.0)
Hemoglobin: 13 g/dL (ref 12.0–15.0)
MCH: 29.3 pg (ref 26.0–34.0)
MCHC: 31.4 g/dL (ref 30.0–36.0)
MCV: 93.2 fL (ref 80.0–100.0)
Platelets: 154 10*3/uL (ref 150–400)
RBC: 4.44 MIL/uL (ref 3.87–5.11)
RDW: 14.6 % (ref 11.5–15.5)
WBC: 5.6 10*3/uL (ref 4.0–10.5)
nRBC: 0 % (ref 0.0–0.2)

## 2019-06-12 LAB — SARS CORONAVIRUS 2 (TAT 6-24 HRS): SARS Coronavirus 2: NEGATIVE

## 2019-06-12 NOTE — Progress Notes (Signed)
[facility]  Patient name: [name] MRN: [id number] DOB: [birth year] Sex: female      Virtual Visit via Telephone Note   This visit type was conducted due to national recommendations for restrictions regarding the COVID-19 Pandemic (e.g. social distancing) in an effort to limit this patient's exposure and mitigate transmission in our community.  Due to her co-morbid illnesses, this patient is at least at moderate risk for complications without adequate follow up.  This format is felt to be most appropriate for this patient at this time.  The patient did not have access to video technology/had technical difficulties with video requiring transitioning to audio format only (telephone).  All issues noted in this document were discussed and addressed.  No physical exam could be performed with this format.   Patient Location: Home Provider Location: Office     REASON FOR APPOINTMENT:    Follow up  HISTORY OF PRESENT ILLNESS:   [name] is a 79 y.o. 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  This was initially detected in 2012 during a workup for abdominal pain.  She had this imaged in 2014 where it measures 1.5 mm.  She denies having any abdominal pain.  Her visit was changed to a virtual visit today because she just got her Covid test as a preop test and was told to quarantine afterwards.  Patient suffers from hypertension which is medically managed.  She has hypercholesterolemia which is treated with fish oil and a statin.  She has hypothyroidism, on levothyroxine.  She suffers from prediabetes and is managing this with diet.   She is a former smoker    PAST MEDICAL HISTORY    Past Medical History:  Diagnosis Date  . Asthma    mild  . 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 Complication of anesthesia    slow to wake up /  body temperature goes down   warm blankets with 1st  surgery only  . Hyperlipidemia   . Hypertension   . Hypothyroidism   . Pneumonia last time 2000  . PONV (postoperative nausea and vomiting)   . 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 Skin cancer Father     SOCIAL HISTORY:   Social History   Socioeconomic History  . Marital status: Widowed    Spouse name: Not on file  . Number of children: Not on file  . Years of education: Not on file  . Highest education level: Not on file  Occupational History  . Not on file  Tobacco Use  . Smoking status: Former Smoker    Types: Cigarettes    Quit date: 12/08/2003    Years since quitting: 15.5  . Smokeless tobacco: Never Used  . Tobacco comment: social  Substance and Sexual Activity  . Alcohol use: No  . Drug use: No  . Sexual activity: Not Currently  Other Topics Concern  . Not on file  Social History Narrative  . Not on file   Social Determinants of Health   Financial Resource Strain:   . Difficulty of Paying Living Expenses:   Food Insecurity:   . Worried About Charity fundraiser in the Last Year:   . [employer]  of Food in the Last Year:   Transportation Needs:   . Film/video editor (Medical):   Marland Kitchen Lack of Transportation (Non-Medical):   Physical Activity:   . Days of Exercise per Week:   . Minutes of Exercise per Session:   Stress:   . Feeling of Stress :   Social Connections:   . Frequency of Communication with Friends and Family:   . Frequency of Social Gatherings with Friends and Family:   . Attends Religious Services:   . Active Member of Clubs or Organizations:   . Attends Archivist Meetings:   Marland Kitchen Marital Status:   Intimate Partner Violence:   . Fear of Current or Ex-Partner:   . Emotionally Abused:   Marland Kitchen Physically Abused:   . Sexually Abused:     ALLERGIES:    Allergies  Allergen Reactions  . Prednisone Other (See Comments)    Feels Dizzy  .  Codeine Anxiety    "Jumpy inside"  . Latex Rash    CURRENT MEDICATIONS:    Current Outpatient Medications  Medication Sig Dispense Refill  . albuterol (VENTOLIN HFA) 108 (90 Base) MCG/ACT inhaler Inhale 2 puffs into the lungs every 6 (six) hours as needed for wheezing or shortness of breath.    Marland Kitchen aspirin 81 MG chewable tablet Chew 81 mg by mouth daily.    . cholecalciferol (VITAMIN D3) 25 MCG (1000 UT) tablet Take 2,000 Units by mouth every evening.    . clotrimazole (LOTRIMIN) 1 % cream Apply to affected area twice a day and then decrease to once a day as irritation decreases. (Patient taking differently: Apply 1 application topically daily as needed (irritation). ) 60 g 3  . hydrochlorothiazide (HYDRODIURIL) 25 MG tablet Take 12.5 mg by mouth daily.     Marland Kitchen levothyroxine (SYNTHROID) 75 MCG tablet Take 75 mcg by mouth daily before breakfast.    . lidocaine (XYLOCAINE) 5 % ointment APPLY TO AFFECTED AREA 3 TIMES A DAY AS NEEDED (TO VULVA) (Patient taking differently: Apply 1 application topically daily as needed for mild pain. ) 1 g 0  . Omega-3 Fatty Acids (FISH OIL) 1200 MG CAPS Take 6,000 mg by mouth every evening.     . Oxcarbazepine (TRILEPTAL) 300 MG tablet Take 150 mg by mouth 2 (two) times daily as needed (Trigeminal neuralgia).     . rosuvastatin (CRESTOR) 5 MG tablet Take 5 mg by mouth at bedtime.     No current facility-administered medications for this visit.    REVIEW OF SYSTEMS:   Please see the history of present illness.     All other systems reviewed and are negative.  PHYSICAL EXAM:   There were no vitals filed for this visit.  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 Overall, the size and configuration remains unchanged dating to 2012, if not slightly smaller, with near complete circumferential calcification of  the small saccular outpouching inferior to the common hepatic artery.  Aortic Atherosclerosis (ICD10-I70.0).  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  The patient will follow-up in 1 year with an abdominal ultrasound    Time:   Today, I have spent 11 minutes with the patient with telehealth technology discussing the above problems.      Leia Alf, MD, FACS [facility] [phone] Pager [phone]

## 2019-06-14 ENCOUNTER — Other Ambulatory Visit: Payer: Self-pay | Admitting: *Deleted

## 2019-06-14 ENCOUNTER — Telehealth: Payer: Self-pay

## 2019-06-14 DIAGNOSIS — I728 Aneurysm of other specified arteries: Secondary | ICD-10-CM

## 2019-06-14 NOTE — Telephone Encounter (Signed)
Reviewed pre op instructions with Anne Leckner as noted by Zelphia Cairo on 06-08-19. [name] verbalized understanding.

## 2019-06-15 ENCOUNTER — Encounter (HOSPITAL_BASED_OUTPATIENT_CLINIC_OR_DEPARTMENT_OTHER): Payer: Self-pay | Admitting: Gynecologic Oncology

## 2019-06-15 ENCOUNTER — Encounter (HOSPITAL_BASED_OUTPATIENT_CLINIC_OR_DEPARTMENT_OTHER)
Admission: RE | Disposition: A | Discharge status: Home / Self Care | Payer: Self-pay | Source: Home / Self Care | Attending: Gynecologic Oncology

## 2019-06-15 ENCOUNTER — Ambulatory Visit (HOSPITAL_BASED_OUTPATIENT_CLINIC_OR_DEPARTMENT_OTHER): Payer: Medicare Other | Admitting: Anesthesiology

## 2019-06-15 ENCOUNTER — Ambulatory Visit (HOSPITAL_BASED_OUTPATIENT_CLINIC_OR_DEPARTMENT_OTHER)
Admission: RE | Admit: 2019-06-15 | Discharge: 2019-06-15 | Disposition: A | Discharge status: Home / Self Care | Payer: Medicare Other | Attending: Gynecologic Oncology | Admitting: Gynecologic Oncology

## 2019-06-15 ENCOUNTER — Other Ambulatory Visit: Payer: Self-pay

## 2019-06-15 DIAGNOSIS — G500 Trigeminal neuralgia: Secondary | ICD-10-CM | POA: Insufficient documentation

## 2019-06-15 DIAGNOSIS — E039 Hypothyroidism, unspecified: Secondary | ICD-10-CM | POA: Diagnosis not present

## 2019-06-15 DIAGNOSIS — Z79890 Hormone replacement therapy (postmenopausal): Secondary | ICD-10-CM | POA: Insufficient documentation

## 2019-06-15 DIAGNOSIS — Z79899 Other long term (current) drug therapy: Secondary | ICD-10-CM | POA: Diagnosis not present

## 2019-06-15 DIAGNOSIS — I10 Essential (primary) hypertension: Secondary | ICD-10-CM | POA: Diagnosis not present

## 2019-06-15 DIAGNOSIS — D071 Carcinoma in situ of vulva: Secondary | ICD-10-CM

## 2019-06-15 DIAGNOSIS — Z8541 Personal history of malignant neoplasm of cervix uteri: Secondary | ICD-10-CM | POA: Insufficient documentation

## 2019-06-15 DIAGNOSIS — Z808 Family history of malignant neoplasm of other organs or systems: Secondary | ICD-10-CM | POA: Diagnosis not present

## 2019-06-15 DIAGNOSIS — Z7982 Long term (current) use of aspirin: Secondary | ICD-10-CM | POA: Insufficient documentation

## 2019-06-15 DIAGNOSIS — E785 Hyperlipidemia, unspecified: Secondary | ICD-10-CM | POA: Diagnosis not present

## 2019-06-15 DIAGNOSIS — Z87891 Personal history of nicotine dependence: Secondary | ICD-10-CM | POA: Insufficient documentation

## 2019-06-15 DIAGNOSIS — C519 Malignant neoplasm of vulva, unspecified: Secondary | ICD-10-CM

## 2019-06-15 HISTORY — PX: CO2 LASER APPLICATION: SHX5778

## 2019-06-15 HISTORY — DX: Unspecified asthma, uncomplicated: J45.909

## 2019-06-15 HISTORY — DX: Aneurysm of other specified arteries: I72.8

## 2019-06-15 SURGERY — CO2 LASER APPLICATION
Anesthesia: General | Site: Vulva

## 2019-06-15 MED ORDER — LACTATED RINGERS IV SOLN
INTRAVENOUS | Status: DC
  Filled 2019-06-15: qty 1000

## 2019-06-15 MED ORDER — BUPIVACAINE HCL 0.25 % IJ SOLN
INTRAMUSCULAR | Status: DC | PRN
  Administered 2019-06-15: 30 mL

## 2019-06-15 MED ORDER — PROMETHAZINE HCL 25 MG/ML IJ SOLN
INTRAMUSCULAR | Status: DC | PRN
  Administered 2019-06-15: 6.25 mg via INTRAVENOUS

## 2019-06-15 MED ORDER — SENNOSIDES-DOCUSATE SODIUM 8.6-50 MG PO TABS: 2.0000 | ORAL_TABLET | Freq: Every day | ORAL | 1 refills | Status: DC

## 2019-06-15 MED ORDER — MEPERIDINE HCL 25 MG/ML IJ SOLN
6.2500 mg | INTRAMUSCULAR | Status: DC | PRN
  Filled 2019-06-15: qty 1

## 2019-06-15 MED ORDER — FENTANYL CITRATE (PF) 100 MCG/2ML IJ SOLN
INTRAMUSCULAR | Status: AC
  Filled 2019-06-15: qty 2

## 2019-06-15 MED ORDER — HEPARIN SODIUM (PORCINE) 5000 UNIT/ML IJ SOLN
5000.0000 [IU] | INTRAMUSCULAR | Status: AC
  Administered 2019-06-15: 5000 [IU] via SUBCUTANEOUS
  Filled 2019-06-15: qty 1

## 2019-06-15 MED ORDER — ACETAMINOPHEN 500 MG PO TABS
1000.0000 mg | ORAL_TABLET | ORAL | Status: AC
  Administered 2019-06-15: 1000 mg via ORAL
  Filled 2019-06-15: qty 2

## 2019-06-15 MED ORDER — MIDAZOLAM HCL 2 MG/2ML IJ SOLN
INTRAMUSCULAR | Status: AC
  Filled 2019-06-15: qty 2

## 2019-06-15 MED ORDER — PROPOFOL 10 MG/ML IV BOLUS
INTRAVENOUS | Status: DC | PRN
  Administered 2019-06-15: 150 mg via INTRAVENOUS

## 2019-06-15 MED ORDER — ONDANSETRON HCL 4 MG/2ML IJ SOLN
INTRAMUSCULAR | Status: DC | PRN
  Administered 2019-06-15: 4 mg via INTRAVENOUS

## 2019-06-15 MED ORDER — SODIUM CHLORIDE (PF) 0.9 % IJ SOLN
INTRAMUSCULAR | Status: AC
  Filled 2019-06-15: qty 10

## 2019-06-15 MED ORDER — IODINE STRONG (LUGOLS) 5 % PO SOLN
ORAL | Status: DC | PRN
  Administered 2019-06-15: 3 mL

## 2019-06-15 MED ORDER — ONDANSETRON HCL 4 MG/2ML IJ SOLN
INTRAMUSCULAR | Status: AC
  Filled 2019-06-15: qty 2

## 2019-06-15 MED ORDER — SCOPOLAMINE 1 MG/3DAYS TD PT72
MEDICATED_PATCH | TRANSDERMAL | Status: AC
  Filled 2019-06-15: qty 1

## 2019-06-15 MED ORDER — ONDANSETRON HCL 4 MG/2ML IJ SOLN
4.0000 mg | Freq: Once | INTRAMUSCULAR | Status: DC | PRN
  Filled 2019-06-15: qty 2

## 2019-06-15 MED ORDER — DIPHENHYDRAMINE HCL 50 MG/ML IJ SOLN
INTRAMUSCULAR | Status: AC
  Filled 2019-06-15: qty 1

## 2019-06-15 MED ORDER — PROMETHAZINE HCL 25 MG/ML IJ SOLN
INTRAMUSCULAR | Status: AC
  Filled 2019-06-15: qty 1

## 2019-06-15 MED ORDER — ONDANSETRON HCL 4 MG PO TABS
4.0000 mg | ORAL_TABLET | Freq: Four times a day (QID) | ORAL | Status: DC | PRN
  Filled 2019-06-15: qty 1

## 2019-06-15 MED ORDER — DIPHENHYDRAMINE HCL 50 MG/ML IJ SOLN
INTRAMUSCULAR | Status: DC | PRN
  Administered 2019-06-15: 12.5 mg via INTRAVENOUS

## 2019-06-15 MED ORDER — ACETIC ACID 5 % SOLN
Status: DC | PRN
  Administered 2019-06-15: 1 via TOPICAL

## 2019-06-15 MED ORDER — HEPARIN SODIUM (PORCINE) 5000 UNIT/ML IJ SOLN
INTRAMUSCULAR | Status: AC
  Filled 2019-06-15: qty 1

## 2019-06-15 MED ORDER — HYDROMORPHONE HCL 1 MG/ML IJ SOLN
0.2500 mg | INTRAMUSCULAR | Status: DC | PRN
  Filled 2019-06-15: qty 0.5

## 2019-06-15 MED ORDER — ONDANSETRON HCL 4 MG/2ML IJ SOLN
4.0000 mg | Freq: Four times a day (QID) | INTRAMUSCULAR | Status: DC | PRN
  Filled 2019-06-15: qty 2

## 2019-06-15 MED ORDER — ACETAMINOPHEN 500 MG PO TABS
ORAL_TABLET | ORAL | Status: AC
  Filled 2019-06-15: qty 2

## 2019-06-15 MED ORDER — SCOPOLAMINE 1 MG/3DAYS TD PT72
MEDICATED_PATCH | TRANSDERMAL | Status: DC | PRN
  Administered 2019-06-15: 1.5 mg via TRANSDERMAL

## 2019-06-15 MED ORDER — DEXAMETHASONE SODIUM PHOSPHATE 4 MG/ML IJ SOLN
4.0000 mg | INTRAMUSCULAR | Status: DC
  Filled 2019-06-15: qty 1

## 2019-06-15 MED ORDER — MIDAZOLAM HCL 5 MG/5ML IJ SOLN
INTRAMUSCULAR | Status: DC | PRN
  Administered 2019-06-15: 2 mg via INTRAVENOUS

## 2019-06-15 MED ORDER — LIDOCAINE 2% (20 MG/ML) 5 ML SYRINGE
INTRAMUSCULAR | Status: DC | PRN
  Administered 2019-06-15: 100 mg via INTRAVENOUS

## 2019-06-15 MED ORDER — OXYCODONE HCL 5 MG PO TABS
5.0000 mg | ORAL_TABLET | ORAL | Status: DC | PRN
  Filled 2019-06-15: qty 2

## 2019-06-15 MED ORDER — LIDOCAINE 2% (20 MG/ML) 5 ML SYRINGE
INTRAMUSCULAR | Status: AC
  Filled 2019-06-15: qty 5

## 2019-06-15 MED ORDER — PROPOFOL 10 MG/ML IV BOLUS
INTRAVENOUS | Status: AC
  Filled 2019-06-15: qty 20

## 2019-06-15 MED ORDER — HYDROMORPHONE HCL 1 MG/ML IJ SOLN
0.2000 mg | INTRAMUSCULAR | Status: DC | PRN
  Filled 2019-06-15: qty 1

## 2019-06-15 MED ORDER — FENTANYL CITRATE (PF) 100 MCG/2ML IJ SOLN
INTRAMUSCULAR | Status: DC | PRN
  Administered 2019-06-15 (×4): 25 ug via INTRAVENOUS

## 2019-06-15 MED ORDER — TRAMADOL HCL 50 MG PO TABS
50.0000 mg | ORAL_TABLET | Freq: Four times a day (QID) | ORAL | Status: DC | PRN
  Filled 2019-06-15: qty 1

## 2019-06-15 SURGICAL SUPPLY — 41 items
BLADE SURG 15 STRL LF DISP TIS (BLADE) IMPLANT
BLADE SURG 15 STRL SS (BLADE) ×2
CANISTER SUCT 1200ML W/VALVE (MISCELLANEOUS) IMPLANT
CANISTER SUCT 3000ML PPV (MISCELLANEOUS) IMPLANT
CATH ROBINSON RED A/P 16FR (CATHETERS) ×1 IMPLANT
COVER WAND RF STERILE (DRAPES) ×2 IMPLANT
DEPRESSOR TONGUE BLADE STERILE (MISCELLANEOUS) ×4 IMPLANT
DRSG TELFA 3X8 NADH (GAUZE/BANDAGES/DRESSINGS) ×4 IMPLANT
ELECT BALL LEEP 3MM BLK (ELECTRODE) IMPLANT
GAUZE 4X4 16PLY RFD (DISPOSABLE) ×1 IMPLANT
GLOVE BIO SURGEON STRL SZ 6 (GLOVE) IMPLANT
GLOVE BIOGEL PI IND STRL 7.0 (GLOVE) IMPLANT
GLOVE BIOGEL PI INDICATOR 7.0 (GLOVE) ×1
GLOVE EUDERMIC 6.5 POWDERFREE (GLOVE) ×2 IMPLANT
GLOVE INDICATOR 6.0 STRL GRN (GLOVE) ×1 IMPLANT
GOWN STRL REUS W/ TWL LRG LVL3 (GOWN DISPOSABLE) ×4 IMPLANT
GOWN STRL REUS W/TWL LRG LVL3 (GOWN DISPOSABLE) ×8
KIT TURNOVER CYSTO (KITS) ×1 IMPLANT
MANIFOLD NEPTUNE II (INSTRUMENTS) ×2 IMPLANT
NDL HYPO 25X1 1.5 SAFETY (NEEDLE) IMPLANT
NEEDLE HYPO 25X1 1.5 SAFETY (NEEDLE) ×2 IMPLANT
PAD DRESSING TELFA 3X8 NADH (GAUZE/BANDAGES/DRESSINGS) IMPLANT
PAD PREP 24X48 CUFFED NSTRL (MISCELLANEOUS) ×2 IMPLANT
PUNCH BIOPSY DERMAL 3 (INSTRUMENTS) IMPLANT
PUNCH BIOPSY DERMAL 3MM (INSTRUMENTS)
PUNCH BIOPSY DERMAL 4MM (INSTRUMENTS) ×1 IMPLANT
SUT VIC AB 0 CT1 36 (SUTURE) IMPLANT
SUT VIC AB 2-0 SH 27 (SUTURE) ×4
SUT VIC AB 2-0 SH 27XBRD (SUTURE) IMPLANT
SUT VIC AB 3-0 PS2 18 (SUTURE)
SUT VIC AB 3-0 PS2 18XBRD (SUTURE) IMPLANT
SUT VIC AB 3-0 SH 27 (SUTURE)
SUT VIC AB 3-0 SH 27X BRD (SUTURE) IMPLANT
SUT VIC AB 4-0 PS2 18 (SUTURE) IMPLANT
SUT VICRYL 0 UR6 27IN ABS (SUTURE) IMPLANT
SWAB OB GYN 8IN STERILE 2PK (MISCELLANEOUS) ×4 IMPLANT
TOWEL OR 17X26 10 PK STRL BLUE (TOWEL DISPOSABLE) ×3 IMPLANT
TUBE CONNECTING 12X1/4 (SUCTIONS) ×2 IMPLANT
VACUUM HOSE 7/8X10 W/ WAND (MISCELLANEOUS) IMPLANT
VACUUM HOSE/TUBING 7/8INX6FT (MISCELLANEOUS) ×2 IMPLANT
WATER STERILE IRR 1000ML POUR (IV SOLUTION) ×2 IMPLANT

## 2019-06-15 NOTE — Transfer of Care (Signed)
Immediate Anesthesia Transfer of Care Note  Patient: [name]  Procedure(s) Performed: C02 LASER OF VULVAR, VULVAR BIOPSIES,  SIMPLE VULVECTOMY (N/A Vulva)  Patient Location: PACU  Anesthesia Type:General  Level of Consciousness: awake, drowsy and responds to stimulation  Airway & Oxygen Therapy: Patient Spontanous Breathing and Patient connected to nasal cannula oxygen  Post-op Assessment: Report given to RN and Post -op Vital signs reviewed and stable  Post vital signs: Reviewed and stable  Last Vitals:  Vitals Value Taken Time  BP 130/78 06/15/19 1009  Temp    Pulse 66 06/15/19 1011  Resp 11 06/15/19 1011  SpO2 96 % 06/15/19 1011  Vitals shown include unvalidated device data.  Last Pain:  Vitals:   06/15/19 0740  TempSrc: Oral  PainSc: 0-No pain      Patients Stated Pain Goal: 5 ([id number])  Complications: No apparent anesthesia complications

## 2019-06-15 NOTE — Anesthesia Preprocedure Evaluation (Addendum)
Anesthesia Evaluation  Patient identified by MRN, date of birth, ID band Patient awake    Reviewed: Allergy & Precautions, NPO status , Patient's Chart, lab work & pertinent test results  History of Anesthesia Complications (+) PONV  Airway Mallampati: I  TM Distance: >3 FB Neck ROM: Full    Dental   Pulmonary former smoker,    Pulmonary exam normal        Cardiovascular hypertension, Pt. on medications Normal cardiovascular exam     Neuro/Psych    GI/Hepatic   Endo/Other    Renal/GU      Musculoskeletal   Abdominal   Peds  Hematology   Anesthesia Other Findings   Reproductive/Obstetrics                             Anesthesia Physical Anesthesia Plan  ASA: II  Anesthesia Plan: General   Post-op Pain Management:    Induction: Intravenous  PONV Risk Score and Plan: 4 or greater and Scopolamine patch - Pre-op, Midazolam, Ondansetron and Diphenhydramine  Airway Management Planned: LMA  Additional Equipment:   Intra-op Plan:   Post-operative Plan: Extubation in OR  Informed Consent: I have reviewed the patients History and Physical, chart, labs and discussed the procedure including the risks, benefits and alternatives for the proposed anesthesia with the patient or authorized representative who has indicated his/her understanding and acceptance.       Plan Discussed with: CRNA and Surgeon  Anesthesia Plan Comments:        Anesthesia Quick Evaluation

## 2019-06-15 NOTE — Op Note (Signed)
PATIENT: [name] DATE: 06/15/19   Preop Diagnosis: VIN III, h/o stage IB vulvar cancer  Postoperative Diagnosis: same as above  Surgery: Partial simple left vulvectomy, partial simple peri-clitoral vulvectomy, vulvar biopsies at 1, 5 and 10 o'clock, extensive CO2 laser ablation of the vulvar  Surgeons:  Jeral Pinch, MD Assistant: Joylene John, NP  Anesthesia: General, 30cc of .25% marcaine   Estimated blood loss: minimal  IVF:  700 ml   Urine output: 123XX123 ml   Complications: None apparent  Pathology: Left vulvar (3 o'c) with marking stitch at 12 o'clock, peri-clitoral vulva (superior/clitoral hood) with marking stitch at 12 o'clock, vulvar biopsies at 1, 5 and 10 o'clock  Operative findings: Erythema extending from introitus extending bilaterally (area of 6.5x2.5cm on the left and 6cmx1.5cm on the right), spanning the length of the vagina almost in a kissing lesion formation. Prior biopsy site at 3 o'clock on left vulva mildly ulcerated, excision performed with 0.5-1 cm margin around site. Some ulceration noted on clitoral hood, area excised. Vulvar biopsies taken around the erythematous area. No other lesions noted. Lugols used at the vaginal apex and along the length of the vagina with no decreased areas of uptake noted.  Procedure: The patient was identified in the preoperative holding area. Informed consent was signed on the chart. Patient was seen history was reviewed and exam was performed.   The patient was then taken to the operating room and placed in the supine position with SCD hose on. General anesthesia was then induced without difficulty. She was then placed in the dorsolithotomy position. The perineum was prepped with Betadine. The vagina was prepped with Betadine. The patient was then draped after the prep was dried.   The patient's surgical field was draped with wet towels. The staff and patient ensured laser-safe eyewear and masks were fitted.    Timeout was performed. The vulva was examined and a marking pen was used to circumscribe the two areas planned for excision  with appropriate surgical margins. The subcuticular tissues were infiltrated with 0.25% marcaine. The 15 blade scalpel was used to make an incision through the skin circumferentially as marked. The skin elipse was grasped and was separated from the underlying deep dermal tissues with the scalpel. After the specimens had been completely resected, it was oriented and marked at 12 o'clock with a 2-vicryl suture. The bovie was used to obtain hemostasis at the surgical beds. The subcutaneous tissues were irrigated and made hemostatic.   1mm punch biopsies were then taken with locations noted above.  The laser was set to 8 watts continuous. The laser was tested for accuracy on a tongue depressor. The laser was applied to the circumscribed area of the vulva that had been previously identified, including around the margins of all biopsy sites and the two vulvar excisions. The tissue was ablated to the desired depth and the eschar was removed with a moistened sponge. When the entire lesion had been ablated the procedure was complete.  The deep dermal layer of the two excision sites were approximated with 2-0 vicryl mattress sutures to bring the skin edges into approximation and off tension. The wound was closed following langher's lines. The cutaneous layer was closed with interrupted 4-0 vicryl stitches and mattress sutures to ensure a tension free and hemostatic closure. The perineum was again irrigated.   Silvadine cream was applied to the laser site.  A Foley catheter was inserted into the bladder under sterile conditions for an I&O catheterization.  All instrument,  suture, laparotomy, Ray-Tec, and needle counts were correct x3. The patient tolerated the procedure well and was taken recovery room in stable condition.  Jeral Pinch MD Gynecologic Oncology

## 2019-06-15 NOTE — Brief Op Note (Signed)
06/15/2019  10:05 AM  PATIENT:  [name]  79 y.o. female  PRE-OPERATIVE DIAGNOSIS:  VULVAR DYSPLASIA,HISTORY OF VULVAR CANCER  POST-OPERATIVE DIAGNOSIS:  * No post-op diagnosis entered *  PROCEDURE:  Procedure(s): C02 LASER OF VULVAR, VULVAR BIOPSIES,  SIMPLE VULVECTOMY (N/A)  SURGEON:  Surgeon(s) and Role:    * Lafonda Mosses, MD - Primary  ASSISTANTS: Joylene John, NP  ANESTHESIA:   general  EBL:  minimal  BLOOD ADMINISTERED:none  DRAINS: none   LOCAL MEDICATIONS USED:  MARCAINE     SPECIMEN:  Source of Specimen:  left vula (stitch at 12), peri-cliotral (stitch at 12), vulvar biopsies at 5, 10 and and 1 o'clock  DISPOSITION OF SPECIMEN:  PATHOLOGY  COUNTS:  YES  TOURNIQUET:  * No tourniquets in log *  DICTATION: .Note written in EPIC  PLAN OF CARE: Discharge to home after PACU  PATIENT DISPOSITION:  PACU - hemodynamically stable.   Delay start of Pharmacological VTE agent (>24hrs) due to surgical blood loss or risk of bleeding: not applicable

## 2019-06-15 NOTE — Discharge Instructions (Addendum)
06/15/2019  Return to work: 2-4 weeks if applicable  Activity: 1. Be up and out of the bed during the day.  Take a nap if needed.  You may walk up steps but be careful and use the hand rail.  Stair climbing will tire you more than you think, you may need to stop part way and rest.   2. No lifting or straining for 6 weeks.  3. No driving for 1 week(s).  Do not drive if you are taking narcotic pain medicine.  4. Shower daily.  Use soap and water on your incision and pat dry; don't rub.  No tub baths until cleared by your surgeon.   5. No sexual activity and nothing in the vagina for 4 weeks.  6. You may experience a small amount of clear drainage from your incision, which is normal.  If the drainage increases or changes in consistency, please call the office.   7. Take Tylenol or ibuprofen first for pain and only use narcotic pain medication for severe pain not relieved by the Tylenol or Ibuprofen.  Monitor your Tylenol intake to a max of 4,000 mg.  8. You can do a sitz bath twice a day and reapply silvadene cream to the vulva. Use a peri bottle after toileting and pat dry.  9. You can use frozen peas in a small ziploc bag to the vulva to help with discomfort and swelling.   Diet: 1. Low sodium Heart Healthy Diet is recommended.  2. It is safe to use a laxative, such as Miralax or Colace, if you have difficulty moving your bowels. You can take Sennakot at bedtime every evening to keep bowel movements regular and to prevent constipation.    Wound Care: 1. Keep clean and dry.  Shower daily.  Reasons to call the Doctor:  Fever - Oral temperature greater than 100.4 degrees Fahrenheit  Foul-smelling vaginal discharge  Difficulty urinating  Nausea and vomiting  Increased pain at the site of the incision that is unrelieved with pain medicine.  Difficulty breathing with or without chest pain  New calf pain especially if only on one side  Sudden, continuing increased vaginal  bleeding with or without clots.   Contacts: For questions or concerns you should contact:  Dr. Jeral Pinch at [phone]  Joylene John, NP at [phone]  After Hours: call [phone] and have the GYN Oncologist paged/contacted  Vulvectomy, Care After  This sheet gives you information about how to care for yourself after your procedure. Your health care provider may also give you more specific instructions. If you have problems or questions, contact your health care provider. What can I expect after the procedure? After the procedure, it is common to have:  Vaginal pain.  Vaginal numbness.  Vaginal swelling.  Bloody vaginal discharge. Follow these instructions at home: Activity   Rest as told by your health care provider.  Do not lift, push, or pull more than 10 lb (2.3 kg), or the limit that you are told, until your health care provider says that it is safe.  Avoid activities that take a lot of effort for as long as told by your health care provider. This includes any exercise.  Raise (elevate) your legs while sitting or lying down.  Avoid standing or sitting in one place for long periods of time.  Do not cross your legs, especially when sitting, until your health care provider approves.  Return to your normal activities as told by your health care provider. Ask  your health care provider what activities are safe for you. Bathing   Do not swim, or use a hot tub until your health care provider approves. Ask your health care provider if you can take showers. You may only be allowed to take sponge baths.  After passing urine or a bowel movement, wipe yourself by patting from front to back and clean your vaginal area using a spray bottle.  If told by your health care provider, take a sitz bath to help with discomfort. This is a warm water bath you take while sitting down. ? Do this 2 times a day, or as often as told by your health care provider. ? The water  should only come up to your hips and cover your buttocks. ? You may pat the area dry with a soft, clean towel. ? If needed, you may then gently dry the area with a hair dryer on a cool setting for 5-10 minutes. An enclosed box fan may also be used to gently dry the area. Incision care   Follow instructions from your health care provider about how to take care of your incision areas. Make sure you: ? Leave stitches (sutures), skin glue, adhesive strips, or surgical clips in place. These skin closures may need to stay in place for 2 weeks or longer. If adhesive strip edges start to loosen and curl up, you may trim the loose edges.   Check your incision areas every day for signs of infection. It may be helpful to use a handheld mirror to do this. Check for: ? Redness, swelling, or pain that has gotten worse. ? More fluid or blood. ? Warmth. ? Pus or a bad smell.  If you were sent home with a drain, take care of it as told by your health care provider. Lifestyle  Do not douche or use tampons until your health care provider approves.  Do not have sex until your health care provider approves. Tell your health care provider if you have pain or numbness when you return to sexual activity.  Wear cotton underwear and comfortable, loose-fitting clothing. Medicines  Take over-the-counter and prescription medicines only as told by your health care provider.  Ask your health care provider if the medicine prescribed to you: ? Requires you to avoid driving or using heavy machinery. ? Can cause constipation. You may need to take these actions to prevent or treat constipation:  Take over-the-counter or prescription medicines.  Eat foods that are high in fiber, such as beans, whole grains, and fresh fruits and vegetables.  Limit foods that are high in fat and processed sugars, such as fried or sweet foods. General instructions  Do not drive until your health care provider says that it is  safe.  Drink enough fluid to keep your urine pale yellow.  Keep all follow-up visits as told by your health care provider. This is important. Contact a health care provider if:  You have any of these problems in the incision area: ? More redness, swelling, or pain around the incision. ? More fluid or blood coming from the incision. ? Pus or a bad smell coming from the incision. ? The incision feels warm to the touch. ? The incision breaks open.  You have a fever.  You have painful or bloody urination.  You feel nauseous or you vomit.  You have diarrhea.  You develop constipation.  You develop a rash.  You feel dizzy or light-headed.  You have pain that does not get  better with medicine. Get help right away if you:  Faint.  Have leg or chest pain.  Have abdominal pain.  Have shortness of breath. Summary  After the procedure, it is common to have some pain, numbness, or swelling in the vaginal area. It is also common to have some bloody vaginal discharge.  Do not lift, push, or pull more than 5 lb (2.3 kg), or the limit that you are told, or engage in activities that take a lot of effort until your health care provider approves.  Follow instructions from your health care provider about how to take care of your incision.  Keep all follow-up visits as told by your health care provider. This is important. This information is not intended to replace advice given to you by your health care provider. Make sure you discuss any questions you have with your health care provider. Document Revised: 03/22/2018 Document Reviewed: 03/23/2018 Elsevier Patient Education  [place] Instructions  Activity: Get plenty of rest for the remainder of the day. A responsible individual must stay with you for 24 hours following the procedure.  For the next 24 hours, DO NOT: -Drive a car -Paediatric nurse -Drink alcoholic beverages -Take any  medication unless instructed by your physician -Make any legal decisions or sign important papers.  Meals: Start with liquid foods such as gelatin or soup. Progress to regular foods as tolerated. Avoid greasy, spicy, heavy foods. If nausea and/or vomiting occur, drink only clear liquids until the nausea and/or vomiting subsides. Call your physician if vomiting continues.  Special Instructions/Symptoms: Your throat may feel dry or sore from the anesthesia or the breathing tube placed in your throat during surgery. If this causes discomfort, gargle with warm salt water. The discomfort should disappear within 24 hours.  If you had a scopolamine patch placed behind your ear for the management of post- operative nausea and/or vomiting:  1. The medication in the patch is effective for 72 hours, after which it should be removed.  Wrap patch in a tissue and discard in the trash. Wash hands thoroughly with soap and water. 2. You may remove the patch earlier than 72 hours if you experience unpleasant side effects which may include dry mouth, dizziness or visual disturbances. 3. Avoid touching the patch. Wash your hands with soap and water after contact with the patch.

## 2019-06-15 NOTE — Anesthesia Postprocedure Evaluation (Signed)
Anesthesia Post Note  Patient: [name]  Procedure(s) Performed: C02 LASER OF VULVAR, VULVAR BIOPSIES,  SIMPLE VULVECTOMY (N/A Vulva)     Patient location during evaluation: PACU Anesthesia Type: General Level of consciousness: awake and alert Pain management: pain level controlled Vital Signs Assessment: post-procedure vital signs reviewed and stable Respiratory status: spontaneous breathing, nonlabored ventilation, respiratory function stable and patient connected to nasal cannula oxygen Cardiovascular status: blood pressure returned to baseline and stable Postop Assessment: no apparent nausea or vomiting Anesthetic complications: no    Last Vitals:  Vitals:   06/15/19 1115 06/15/19 1145  BP: 126/62 111/62  Pulse: (!) 57 62  Resp: 12 12  Temp: 36.6 C 36.7 C  SpO2: 93% 95%    Last Pain:  Vitals:   06/15/19 1145  TempSrc:   PainSc: 0-No pain                 Darely Becknell DAVID

## 2019-06-15 NOTE — Anesthesia Procedure Notes (Signed)
Procedure Name: LMA Insertion Date/Time: 06/15/2019 8:59 AM Performed by: Lollie Sails, CRNA Pre-anesthesia Checklist: Patient identified, Emergency Drugs available, Suction available, Patient being monitored and Timeout performed Patient Re-evaluated:Patient Re-evaluated prior to induction Oxygen Delivery Method: Circle system utilized Preoxygenation: Pre-oxygenation with 100% oxygen Induction Type: IV induction LMA: LMA inserted LMA Size: 4.0 Number of attempts: 1 Placement Confirmation: positive ETCO2 and breath sounds checked- equal and bilateral Tube secured with: Tape Dental Injury: Teeth and Oropharynx as per pre-operative assessment

## 2019-06-15 NOTE — Interval H&P Note (Signed)
History and Physical Interval Note:  06/15/2019 8:28 AM  [name]  has presented today for surgery, with the diagnosis of VULVAR DYSPLASIA,HISTORY OF VULVAR CANCER.  The various methods of treatment have been discussed with the patient and family. After consideration of risks, benefits and other options for treatment, the patient has consented to  Procedure(s): C02 LASER OF VULVAR, VULVAR BIOPSIES, POSSIBLE VULVAR EXCISION (N/A) as a surgical intervention.  The patient's history has been reviewed, patient examined, no change in status, stable for surgery.  I have reviewed the patient's chart and labs.  Questions were answered to the patient's satisfaction.     [name]

## 2019-06-16 ENCOUNTER — Telehealth: Payer: Self-pay

## 2019-06-16 ENCOUNTER — Telehealth: Payer: Self-pay | Admitting: Gynecologic Oncology

## 2019-06-16 ENCOUNTER — Ambulatory Visit: Payer: Medicare Other | Admitting: Gynecologic Oncology

## 2019-06-16 LAB — SURGICAL PATHOLOGY

## 2019-06-16 NOTE — Telephone Encounter (Signed)
Spoke with patient post-op, surgery 06/15/19.   Patient is eating, drinking well and urinating without issues. No temp noted per patient. Pain is at a two, patient not taking any pain meds at this time. A little bit of spotting noted per patient. Patient passing some gas but no BM yet.  She has not taken any senna S yet.  Told patient to go ahead and take senna now and if no BM today, increase senna S to 2 tablets twice a day.  Included that she could also take Miralax 1 capful twice a day. Patient knows to call with any issues that may arise.

## 2019-06-16 NOTE — Telephone Encounter (Signed)
Called patient with pathology results. Happy with news. 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 Patient feeling well.  Jeral Pinch MD Gynecologic Oncology

## 2019-07-25 NOTE — Progress Notes (Signed)
Gynecologic Oncology Return Clinic Visit  07/27/19  Reason for Visit: post-op follow-up  Treatment History: She had a palpable vulvar lump in the winter 2019 and 2020. It became more noticeable in April 2020 and she attempted to have an appointment with a gynecologist however she could not get an appointment due to the the coronavirus shot downs. She was eventually able to see a physician on August 25, 2018 at which time evaluation of the vulva confirmed a vulvar mass. 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  Remote history of cervical cancer diagnosed in 2008. 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 Marti Sleigh.   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 09/27/18: Dr Alycia Rossetti performed a radical anterior vulvectomy. 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 The resection margins were negative for carcinoma with the closest being the lateral margin 1:00 at 0.8 cm. The carcinoma invaded 1.3 cm depth. There was no lymphovascular or perineural invasion identified.  Postoperatively a PET/CT was obtained on October 06, 2018. This revealed postoperative changes to the vulva. There was no hypermetabolic local regional lymphadenopathy. There is a questionable subtle focus of hypermetabolism in the left lobe of the liver, indeterminate for liver metastases. MRI abdomen could be performed for further work-up. No other potential findings of hypermetabolic distant metastatic disease were seen.  On 10/25/2018, the patient underwent bilateral superficial inguinal lymphadenectomy with fibroadipose tissue noted on the left and no carcinoma seen in 2 of 2 lymph nodes on the right.  The patient was seen in November with vulvar irritation that was intermittent in nature.  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  She was started on triamcinolone twice a day until her symptoms improved and then transition to daily.  I saw her at the end of December and she felt some improvement in her vulvar tenderness but still had significant erythema and irritation of the vulva.  I asked her to add Desitin or similar barrier cream.  When I saw her again in February, she had no improvement and had had significant irritation with the Desitin.  We stopped all topical treatment altogether.  ON 3/25, given persistent skin changes and no improvement in symptoms, vulvar biopsied was performed. Biopsy - high grade dysplasia.  06/15/19: partial simple left vulvectomy, partial simple peri-clitoral vulvectomy, vulvar biopsies, extensive CO2 laser ablation of the vulva. VIN3 noted in two vulvectomy specimens, positive margins of left specimen. Biopsies showed VIN1.   Interval History: Patient reports that overall the burning and pain that she had immediately after surgery have improved and she only now intermittently has burning, still usually with urination.  Her symptoms are significantly improved since prior to surgery.  She notes occasional bleeding which she describes as brown discharge, denies any other discharge or bright red bleeding.  She reports regular bowel and bladder function.  She is continue to use the Silvadene cream as this has felt soothing.  Past Medical/Surgical History: Past Medical History:  Diagnosis Date  . Asthma    mild  . 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 Complication of anesthesia    slow to wake up /  body temperature goes down   warm blankets with 1st surgery only  . Hyperlipidemia   . Hypertension   . Hypothyroidism   . Pneumonia last time  2000  . PONV (postoperative nausea and vomiting)   . 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 ABDOMINAL HYSTERECTOMY   2008   Pelvic lymphadenectomy  . BREAST SURGERY     left breast cyst in areolar area 46 years ago  . CO2 LASER APPLICATION N/A [id number]   Procedure: C02 LASER OF VULVAR, VULVAR BIOPSIES,  SIMPLE VULVECTOMY;  Surgeon: Lafonda Mosses, MD;  Location: [facility];  Service: Gynecology;  Laterality: N/A;  . LYMPH NODE DISSECTION Bilateral 10/25/2018   Procedure: LYMPH NODE DISSECTION,BILATERAL INGUINOFEMORAL LYMPHADECTOMY;  Surgeon: [name], MD;  Location: [facility];  Service: Gynecology;  Laterality: Bilateral;  . RADICAL VULVECTOMY N/A 09/27/2018   Procedure: RADICAL VULVECTOMY;  Surgeon: [name], MD;  Location: [facility];  Service: Gynecology;  Laterality: N/A;  . SUPRAPUBIC CATHETER PLACEMENT     with 2008 surgery, later removed    Family History  Problem Relation Age of Onset  . Skin cancer Father     Social History   Socioeconomic History  . Marital status: Widowed    Spouse name: Not on file  . Number of children: Not on file  . Years of education: Not on file  . Highest education level: Not on file  Occupational History  . Not on file  Tobacco Use  . Smoking status: Former Smoker    Types: Cigarettes    Quit date: 12/08/2003    Years since quitting: 15.6  . Smokeless tobacco: Never Used  . Tobacco comment: social  Substance and Sexual Activity  . Alcohol use: No  . Drug use: No  . Sexual activity: Not Currently  Other Topics Concern  . Not on file  Social History Narrative  . Not on file   Social Determinants of Health   Financial Resource Strain:   . Difficulty of Paying Living Expenses:   Food Insecurity:   . Worried About Charity fundraiser in the Last Year:   . Arboriculturist in the Last Year:   Transportation Needs:   . Film/video editor (Medical):   Marland Kitchen Lack of Transportation (Non-Medical):   Physical Activity:   . Days of Exercise per Week:   . Minutes of Exercise per Session:   Stress:   . Feeling of Stress :   Social  Connections:   . Frequency of Communication with Friends and Family:   . Frequency of Social Gatherings with Friends and Family:   . Attends Religious Services:   . Active Member of Clubs or Organizations:   . Attends Archivist Meetings:   Marland Kitchen Marital Status:     Current Medications:  Current Outpatient Medications:  .  albuterol (VENTOLIN HFA) 108 (90 Base) MCG/ACT inhaler, Inhale 2 puffs into the lungs every 6 (six) hours as needed for wheezing or shortness of breath., Disp: , Rfl:  .  aspirin 81 MG chewable tablet, Chew 81 mg by mouth daily., Disp: , Rfl:  .  cholecalciferol (VITAMIN D3) 25 MCG (1000 UT) tablet, Take 2,000 Units by mouth every evening., Disp: , Rfl:  .  clotrimazole (LOTRIMIN) 1 % cream, Apply to affected area twice a day and then decrease to once a day as irritation decreases. (Patient taking differently: Apply 1 application topically daily as needed (irritation). ), Disp: 60 g, Rfl: 3 .  hydrochlorothiazide (HYDRODIURIL) 25 MG tablet, Take 12.5 mg by mouth daily. ,  Disp: , Rfl:  .  levothyroxine (SYNTHROID) 75 MCG tablet, Take 75 mcg by mouth daily before breakfast., Disp: , Rfl:  .  lidocaine (XYLOCAINE) 5 % ointment, APPLY TO AFFECTED AREA 3 TIMES A DAY AS NEEDED (TO VULVA) (Patient taking differently: Apply 1 application topically daily as needed for mild pain. ), Disp: 1 g, Rfl: 0 .  Omega-3 Fatty Acids (FISH OIL) 1200 MG CAPS, Take 6,000 mg by mouth every evening. , Disp: , Rfl:  .  Oxcarbazepine (TRILEPTAL) 300 MG tablet, Take 150 mg by mouth 2 (two) times daily as needed (Trigeminal neuralgia). , Disp: , Rfl:  .  rosuvastatin (CRESTOR) 5 MG tablet, Take 5 mg by mouth at bedtime., Disp: , Rfl:  .  senna-docusate (SENOKOT-S) 8.6-50 MG tablet, Take 2 tablets by mouth at bedtime. Do not take if having diarrhea, you can stop when on normal bowel routine, Disp: 30 tablet, Rfl: 1  Review of Systems: Denies appetite changes, fevers, chills, fatigue, unexplained  weight changes. Denies hearing loss, neck lumps or masses, mouth sores, ringing in ears or voice changes. Denies cough or wheezing.  Denies shortness of breath. Denies chest pain or palpitations. Denies leg swelling. Denies abdominal distention, pain, blood in stools, constipation, diarrhea, nausea, vomiting, or early satiety. Denies pain with intercourse, dysuria, frequency, hematuria or incontinence. Denies hot flashes, pelvic pain, vaginal bleeding.   Denies joint pain, back pain or muscle pain/cramps. Denies itching, rash, or wounds. Denies dizziness, headaches, numbness or seizures. Denies swollen lymph nodes or glands, denies easy bruising or bleeding. Denies anxiety, depression, confusion, or decreased concentration.  Physical Exam: BP 122/67 (BP Location: Left Arm, Patient Position: Sitting)   Pulse 72   Temp 97.9 F (36.6 C) (Oral)   Resp 16   Ht 5' 5.5" (1.664 m)   Wt 164 lb (74.4 kg)   SpO2 99%   BMI 26.88 kg/m  General: Alert, oriented, no acute distress. HEENT: Normocephalic, atraumatic, sclera anicteric. Chest: Unlabored breathing on room air. Extremities: Grossly normal range of motion.  Warm, well perfused.  No edema bilaterally. Lymphatics: No inguinal adenopathy. GU: There is moderate erythema still in the distribution of the areas I lasered. The left labial resection area is hyperpigmented with suture still visible. There is mild erythema near the introitus posteriorly and on the right with a mild exudate. There has been some agglutination of the superior aspect of the labia. One finger was able to be inserted into the vagina without difficulty or pain.  Laboratory & Radiologic Studies: None new  Assessment & Plan: Makai Cappa is a 79 y.o. woman with Stage 1B vulvar cancer treated surgically in 2020 now approximately 6 weeks s/p vulvar biopsies, vulvectomies and laser ablation of vulvar dysplasia.  Patient's symptoms have improved significantly but her exam  continues to be abnormal - it is difficult to tell if what I am see is still changes from procedure and her recovery or whether changes represent persistent or new dysplasia. I recommended that I see her back in one month. If her exam has improved, will continue with close surveillance. If concerning of dysplasia, we discussed trial of Imiquimod.   15 minutes of total time was spent for this patient encounter, including preparation, face-to-face counseling with the patient and coordination of care, and documentation of the encounter.  Jeral Pinch, MD  Division of Gynecologic Oncology  Department of Obstetrics and Gynecology  [facility]

## 2019-07-27 ENCOUNTER — Other Ambulatory Visit: Payer: Self-pay

## 2019-07-27 ENCOUNTER — Encounter: Payer: Self-pay | Admitting: Gynecologic Oncology

## 2019-07-27 ENCOUNTER — Inpatient Hospital Stay: Payer: Medicare Other | Attending: Gynecologic Oncology | Admitting: Gynecologic Oncology

## 2019-07-27 VITALS — BP 122/67 | HR 72 | Temp 97.9°F | Resp 16 | Ht 65.5 in | Wt 164.0 lb

## 2019-07-27 DIAGNOSIS — C519 Malignant neoplasm of vulva, unspecified: Secondary | ICD-10-CM

## 2019-07-27 NOTE — Patient Instructions (Signed)
I will see you back in a month.  If you are incisions have healed and you are still having symptoms or your exam is concerning for persistent precancerous changes, then we will start the cream we talked about today called Aldara.  If you have any questions or needs before your next visit with me, please call [phone].

## 2019-08-25 ENCOUNTER — Other Ambulatory Visit: Payer: Self-pay

## 2019-08-25 ENCOUNTER — Inpatient Hospital Stay: Payer: Medicare Other | Attending: Gynecologic Oncology | Admitting: Gynecologic Oncology

## 2019-08-25 ENCOUNTER — Encounter: Payer: Self-pay | Admitting: Gynecologic Oncology

## 2019-08-25 VITALS — BP 123/66 | HR 70 | Temp 98.2°F | Resp 16 | Ht 65.5 in | Wt 172.1 lb

## 2019-08-25 DIAGNOSIS — E785 Hyperlipidemia, unspecified: Secondary | ICD-10-CM | POA: Insufficient documentation

## 2019-08-25 DIAGNOSIS — Z9889 Other specified postprocedural states: Secondary | ICD-10-CM | POA: Insufficient documentation

## 2019-08-25 DIAGNOSIS — Z9079 Acquired absence of other genital organ(s): Secondary | ICD-10-CM

## 2019-08-25 DIAGNOSIS — Z8541 Personal history of malignant neoplasm of cervix uteri: Secondary | ICD-10-CM | POA: Insufficient documentation

## 2019-08-25 DIAGNOSIS — Z87891 Personal history of nicotine dependence: Secondary | ICD-10-CM | POA: Insufficient documentation

## 2019-08-25 DIAGNOSIS — I10 Essential (primary) hypertension: Secondary | ICD-10-CM | POA: Diagnosis not present

## 2019-08-25 DIAGNOSIS — Z79899 Other long term (current) drug therapy: Secondary | ICD-10-CM | POA: Diagnosis not present

## 2019-08-25 DIAGNOSIS — E039 Hypothyroidism, unspecified: Secondary | ICD-10-CM | POA: Diagnosis not present

## 2019-08-25 DIAGNOSIS — N900 Mild vulvar dysplasia: Secondary | ICD-10-CM | POA: Insufficient documentation

## 2019-08-25 DIAGNOSIS — J45909 Unspecified asthma, uncomplicated: Secondary | ICD-10-CM | POA: Diagnosis not present

## 2019-08-25 DIAGNOSIS — C519 Malignant neoplasm of vulva, unspecified: Secondary | ICD-10-CM

## 2019-08-25 DIAGNOSIS — Z7982 Long term (current) use of aspirin: Secondary | ICD-10-CM | POA: Insufficient documentation

## 2019-08-25 MED ORDER — ESTROGENS, CONJUGATED 0.625 MG/GM VA CREA: 1.0000 | TOPICAL_CREAM | VAGINAL | 1 refills | Status: DC

## 2019-08-25 NOTE — Patient Instructions (Signed)
Overall, I think your exam looks a little bit better.  I had like you to use the vaginal estrogen every night for 2 weeks and then 3 nights a week.  I will see you in 2 months and we will reevaluate how things look and whether you need to use any other creams to treat the precancerous areas.

## 2019-08-25 NOTE — Progress Notes (Signed)
Gynecologic Oncology Return Clinic Visit  6/18  Reason for Visit: surveillance after surgical treatment of recurrent vulvar dysplasia  Treatment History: She had a palpable vulvar lump in the winter 2019 and 2020. It became more noticeable in April 2020 and she attempted to have an appointment with a gynecologist however she could not get an appointment due to the the coronavirus shot downs. She was eventually able to see a physician on August 25, 2018 at which time evaluation of the vulva confirmed a vulvar mass. 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  Remote history of cervical cancer diagnosed in 2008. 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 Marti Sleigh.   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 09/27/18: Dr Alycia Rossetti performed a radical anterior vulvectomy. 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 The resection margins were negative for carcinoma with the closest being the lateral margin 1:00 at 0.8 cm. The carcinoma invaded 1.3 cm depth. There was no lymphovascular or perineural invasion identified.  Postoperatively a PET/CT was obtained on October 06, 2018. This revealed postoperative changes to the vulva. There was no hypermetabolic local regional lymphadenopathy. There is a questionable subtle focus of hypermetabolism in the left lobe of the liver, indeterminate for liver metastases. MRI abdomen could be performed for further work-up. No other potential findings of hypermetabolic distant metastatic disease were seen.  On 10/25/2018, the patient underwent bilateral superficial inguinal lymphadenectomy with fibroadipose tissue noted on the left and no carcinoma seen in 2 of 2 lymph nodes on the right.  The patient was seen in November with vulvar irritation that was intermittent in nature. 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 She was started on triamcinolone twice a day until her symptoms improved and then transition to daily. I saw her at the end of December and she felt some improvement in her vulvar tenderness but still had significant erythema and irritation of the vulva. I asked her to add Desitin or similar barrier cream. When I saw her again in February, she had no improvement and had had significant irritation with the Desitin. We stopped all topical treatment altogether.  ON 3/25, given persistent skin changes and no improvement in symptoms, vulvar biopsied was performed. Biopsy - high grade dysplasia.  06/15/19: partial simple left vulvectomy, partial simple peri-clitoral vulvectomy, vulvar biopsies, extensive CO2 laser ablation of the vulva. VIN3 noted in two vulvectomy specimens, positive margins of left specimen. Biopsies showed VIN1.   Interval History: Patient reports overall doing well since her last visit.  She continues to have some vulvar burning intermittently although less than previously.  This last for a couple of minutes when she has it.  She denies any association of the burning feeling with urination.  She denies any vaginal bleeding or discharge.  She reports normal bowel and bladder function.  Past Medical/Surgical History: Past Medical History:  Diagnosis Date  . Asthma    mild  . 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 Complication of anesthesia    slow to wake up /  body temperature goes down   warm blankets with 1st surgery only  . Hyperlipidemia   . Hypertension   . Hypothyroidism   . Pneumonia last time 2000  . PONV (postoperative nausea and vomiting)   . Squamous cell carcinoma of cervix ([place])  04/2006   Stage IB1    Past Surgical History:  Procedure Laterality Date  . ABDOMINAL HYSTERECTOMY  2008   Pelvic lymphadenectomy  . BREAST SURGERY     left breast cyst in  areolar area 46 years ago  . CO2 LASER APPLICATION N/A 03/10/4578   Procedure: C02 LASER OF VULVAR, VULVAR BIOPSIES,  SIMPLE VULVECTOMY;  Surgeon: Lafonda Mosses, MD;  Location: [facility];  Service: Gynecology;  Laterality: N/A;  . LYMPH NODE DISSECTION Bilateral 10/25/2018   Procedure: LYMPH NODE DISSECTION,BILATERAL INGUINOFEMORAL LYMPHADECTOMY;  Surgeon: [name], MD;  Location: [facility];  Service: Gynecology;  Laterality: Bilateral;  . RADICAL VULVECTOMY N/A 09/27/2018   Procedure: RADICAL VULVECTOMY;  Surgeon: [name], MD;  Location: [facility];  Service: Gynecology;  Laterality: N/A;  . SUPRAPUBIC CATHETER PLACEMENT     with 2008 surgery, later removed    Family History  Problem Relation Age of Onset  . Skin cancer Father     Social History   Socioeconomic History  . Marital status: Widowed    Spouse name: Not on file  . Number of children: Not on file  . Years of education: Not on file  . Highest education level: Not on file  Occupational History  . Not on file  Tobacco Use  . Smoking status: Former Smoker    Types: Cigarettes    Quit date: 12/08/2003    Years since quitting: 15.7  . Smokeless tobacco: Never Used  . Tobacco comment: social  Vaping Use  . Vaping Use: Never used  Substance and Sexual Activity  . Alcohol use: No  . Drug use: No  . Sexual activity: Not Currently  Other Topics Concern  . Not on file  Social History Narrative  . Not on file   Social Determinants of Health   Financial Resource Strain:   . Difficulty of Paying Living Expenses:   Food Insecurity:   . Worried About Charity fundraiser in the Last Year:   . Arboriculturist in the Last Year:   Transportation Needs:   . Film/video editor (Medical):   Marland Kitchen Lack of Transportation (Non-Medical):   Physical Activity:   . Days of Exercise per Week:   . Minutes of Exercise per Session:   Stress:   . Feeling of Stress :   Social Connections:   . Frequency of  Communication with Friends and Family:   . Frequency of Social Gatherings with Friends and Family:   . Attends Religious Services:   . Active Member of Clubs or Organizations:   . Attends Archivist Meetings:   Marland Kitchen Marital Status:     Current Medications:  Current Outpatient Medications:  .  albuterol (VENTOLIN HFA) 108 (90 Base) MCG/ACT inhaler, Inhale 2 puffs into the lungs every 6 (six) hours as needed for wheezing or shortness of breath., Disp: , Rfl:  .  aspirin 81 MG chewable tablet, Chew 81 mg by mouth daily., Disp: , Rfl:  .  cholecalciferol (VITAMIN D3) 25 MCG (1000 UT) tablet, Take 2,000 Units by mouth every evening., Disp: , Rfl:  .  clotrimazole (LOTRIMIN) 1 % cream, Apply to affected area twice a day and then decrease to once a day as irritation decreases. (Patient taking differently: Apply 1 application topically daily as needed (irritation). ), Disp: 60 g, Rfl: 3 .  hydrochlorothiazide (HYDRODIURIL) 25 MG tablet, Take 12.5 mg by mouth daily. , Disp: , Rfl:  .  levothyroxine (SYNTHROID)  75 MCG tablet, Take 75 mcg by mouth daily before breakfast., Disp: , Rfl:  .  lidocaine (XYLOCAINE) 5 % ointment, APPLY TO AFFECTED AREA 3 TIMES A DAY AS NEEDED (TO VULVA) (Patient taking differently: Apply 1 application topically daily as needed for mild pain. ), Disp: 1 g, Rfl: 0 .  Omega-3 Fatty Acids (FISH OIL) 1200 MG CAPS, Take 6,000 mg by mouth every evening. , Disp: , Rfl:  .  Oxcarbazepine (TRILEPTAL) 300 MG tablet, Take 150 mg by mouth 2 (two) times daily as needed (Trigeminal neuralgia). , Disp: , Rfl:  .  rosuvastatin (CRESTOR) 5 MG tablet, Take 5 mg by mouth at bedtime., Disp: , Rfl:  .  senna-docusate (SENOKOT-S) 8.6-50 MG tablet, Take 2 tablets by mouth at bedtime. Do not take if having diarrhea, you can stop when on normal bowel routine, Disp: 30 tablet, Rfl: 1 .  conjugated estrogens (PREMARIN) vaginal cream, Place 1 Applicatorful vaginally 3 (three) times a week., Disp:  42.5 g, Rfl: 1  Review of Systems: Pertinent positives as per HPI. Denies appetite changes, fevers, chills, fatigue, unexplained weight changes. Denies hearing loss, neck lumps or masses, mouth sores, ringing in ears or voice changes. Denies cough or wheezing.  Denies shortness of breath. Denies chest pain or palpitations. Denies leg swelling. Denies abdominal distention, pain, blood in stools, constipation, diarrhea, nausea, vomiting, or early satiety. Denies pain with intercourse, frequency, hematuria or incontinence. Denies hot flashes, pelvic pain, vaginal bleeding or vaginal discharge.   Denies joint pain, back pain or muscle pain/cramps. Denies itching, rash, or wounds. Denies dizziness, headaches, numbness or seizures. Denies swollen lymph nodes or glands, denies easy bruising or bleeding. Denies anxiety, depression, confusion, or decreased concentration.  Physical Exam: BP 123/66 (BP Location: Right Arm, Patient Position: Sitting)   Pulse 70   Temp 98.2 F (36.8 C) (Oral)   Resp 16   Ht 5' 5.5" (1.664 m)   Wt 172 lb 2 oz (78.1 kg)   SpO2 98%   BMI 28.21 kg/m  General: Alert, oriented, no acute distress. HEENT: Normocephalic, atraumatic, sclera anicteric. Chest: Unlabored breathing on room air. Extremities: Grossly normal range of motion.  Warm, well perfused.  No edema bilaterally. Lymphatics: No inguinal adenopathy. GU: Significant improvement of the erythema. No suture visible. Very mild erythema of the left upper labia and across the area of agglutination of the superior aspect of the labia. The patient identifies this area as the place with intermittent burning. One finger was able to be inserted into the vagina without difficulty or pain.  Laboratory & Radiologic Studies: None new  Assessment & Plan: Hema Lanza is a 79 y.o. woman with Stage 1B vulvar cancer treated surgically in 2020 now approximately 10 weeks s/p vulvar biopsies, vulvectomies and laser ablation of  vulvar dysplasia.  Patient's exam has significantly improved.  There are still some small areas that are consistent with low-grade dysplasia.  The area that she endorses having burning is where the skin has agglutinated between the labia anteriorly.  I discussed a trial of several months of vaginal estrogen both to help with her thinning tissue but also to treat low-grade dysplasia.  I will see her back in 3 months and will reevaluate at that time.  We will hold off on any sort of other topical treatment in the setting of vulvar dysplasia.  20 minutes of total time was spent for this patient encounter, including preparation, face-to-face counseling with the patient and coordination of care, and documentation of  the encounter.  Jeral Pinch, MD  Division of Gynecologic Oncology  Department of Obstetrics and Gynecology  [facility]

## 2019-10-23 ENCOUNTER — Inpatient Hospital Stay: Payer: Medicare Other | Attending: Gynecologic Oncology | Admitting: Gynecologic Oncology

## 2019-10-23 ENCOUNTER — Encounter: Payer: Self-pay | Admitting: Gynecologic Oncology

## 2019-10-23 ENCOUNTER — Other Ambulatory Visit: Payer: Self-pay

## 2019-10-23 VITALS — BP 158/91 | HR 72 | Temp 98.1°F | Resp 16 | Ht 65.5 in | Wt 171.4 lb

## 2019-10-23 DIAGNOSIS — Z9079 Acquired absence of other genital organ(s): Secondary | ICD-10-CM | POA: Diagnosis not present

## 2019-10-23 DIAGNOSIS — Z79899 Other long term (current) drug therapy: Secondary | ICD-10-CM | POA: Insufficient documentation

## 2019-10-23 DIAGNOSIS — Z87891 Personal history of nicotine dependence: Secondary | ICD-10-CM | POA: Insufficient documentation

## 2019-10-23 DIAGNOSIS — E039 Hypothyroidism, unspecified: Secondary | ICD-10-CM | POA: Insufficient documentation

## 2019-10-23 DIAGNOSIS — E785 Hyperlipidemia, unspecified: Secondary | ICD-10-CM | POA: Diagnosis not present

## 2019-10-23 DIAGNOSIS — Z90710 Acquired absence of both cervix and uterus: Secondary | ICD-10-CM | POA: Diagnosis not present

## 2019-10-23 DIAGNOSIS — J45909 Unspecified asthma, uncomplicated: Secondary | ICD-10-CM | POA: Diagnosis not present

## 2019-10-23 DIAGNOSIS — N893 Dysplasia of vagina, unspecified: Secondary | ICD-10-CM | POA: Insufficient documentation

## 2019-10-23 DIAGNOSIS — C519 Malignant neoplasm of vulva, unspecified: Secondary | ICD-10-CM | POA: Insufficient documentation

## 2019-10-23 DIAGNOSIS — Z7982 Long term (current) use of aspirin: Secondary | ICD-10-CM | POA: Diagnosis not present

## 2019-10-23 DIAGNOSIS — I10 Essential (primary) hypertension: Secondary | ICD-10-CM | POA: Insufficient documentation

## 2019-10-23 NOTE — Patient Instructions (Signed)
Things are looking much better!  Keep using the estrogen cream every other night as you have been.  I will see you in 3 months unless things start to head in the wrong direction.  Please call the clinic to be seen sooner if you develop bleeding, discharge, increasing pain, or any other concerning symptoms.

## 2019-10-23 NOTE — Progress Notes (Signed)
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 It became more noticeable in April 2020 and she attempted to have an appointment with a gynecologist however she could not get an appointment due to the the coronavirus shot downs. She was eventually able to see a physician on August 25, 2018 at which time evaluation of the vulva confirmed a vulvar mass. 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  Remote history of cervical cancer diagnosed in 2008. 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 Marti Sleigh.   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 09/27/18:Dr Gehrigperformed aradical anterior vulvectomy. 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 The resection margins were negative for carcinoma with the closest being the lateral margin 1:00 at 0.8 cm. The carcinoma invaded 1.3 cm depth. There was no lymphovascular or perineural invasion identified.  Postoperatively a PET/CT was obtained on October 06, 2018. This revealed postoperative changes to the vulva. There was no hypermetabolic local regional lymphadenopathy. There is a questionable subtle focus of hypermetabolism in the left lobe of the liver, indeterminate for liver metastases. MRI abdomen could be performed for further work-up. No other potential findings of hypermetabolic distant metastatic disease were seen.  On 10/25/2018, the patient underwent bilateral superficial inguinal lymphadenectomy with fibroadipose tissue noted on the left and no carcinoma seen in 2 of 2 lymph nodes on the right.  The patient was seen in November with vulvar irritation that was intermittent in nature. 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 She was started on triamcinolone twice a day until her symptoms improved and then transition to daily. I saw her at the end of December and she felt some improvement in her vulvar tenderness but still had significant erythema and irritation of the vulva. I asked her to add Desitin or similar barrier cream. When I saw her again in February, she had no improvement and had had significant irritation with the Desitin. We stopped all topical treatmentaltogether.  ON 3/25, given persistent skin changes and no improvement in symptoms, vulvar biopsied was performed. Biopsy - high grade dysplasia.  06/15/19: partial simple left vulvectomy, partial simple peri-clitoral vulvectomy, vulvar biopsies, extensive CO2 laser ablation of the vulva. VIN3 noted in two vulvectomy specimens, positive margins of left specimen. Biopsies showed VIN1.  Interval History: Patient presents today overall doing well.  She notes feeling less vulvar tenderness.  She has now been able to sit without having to shift due to discomfort.  She is using the estrogen cream every other evening.  Since her last visit with me, she denies any bleeding or discharge.  She denies any issues with urination including dysuria or hematuria.  She was struggling with some constipation but this has improved.  Past Medical/Surgical History: Past Medical History:  Diagnosis Date  . Asthma    mild  . 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 Complication of anesthesia    slow to wake up /  body temperature goes down   warm blankets with 1st surgery only  . Hyperlipidemia   . Hypertension   . Hypothyroidism   . Pneumonia last time 2000  . PONV (postoperative nausea and vomiting)   . 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 ABDOMINAL HYSTERECTOMY  2008   Pelvic  lymphadenectomy  . BREAST SURGERY     left breast cyst in areolar area 46 years ago  . CO2 LASER APPLICATION N/A 03/09/5724   Procedure: C02 LASER OF VULVAR, VULVAR BIOPSIES,  SIMPLE VULVECTOMY;  Surgeon: Lafonda Mosses, MD;  Location: [facility];  Service: Gynecology;  Laterality: N/A;  . LYMPH NODE DISSECTION Bilateral 10/25/2018   Procedure: LYMPH NODE DISSECTION,BILATERAL INGUINOFEMORAL LYMPHADECTOMY;  Surgeon: [name], MD;  Location: [facility];  Service: Gynecology;  Laterality: Bilateral;  . RADICAL VULVECTOMY N/A 09/27/2018   Procedure: RADICAL VULVECTOMY;  Surgeon: [name], MD;  Location: [facility];  Service: Gynecology;  Laterality: N/A;  . SUPRAPUBIC CATHETER PLACEMENT     with 2008 surgery, later removed    Family History  Problem Relation Age of Onset  . Skin cancer Father     Social History   Socioeconomic History  . Marital status: Widowed    Spouse name: Not on file  . Number of children: Not on file  . Years of education: Not on file  . Highest education level: Not on file  Occupational History  . Not on file  Tobacco Use  . Smoking status: Former Smoker    Types: Cigarettes    Quit date: 12/08/2003    Years since quitting: 15.8  . Smokeless tobacco: Never Used  . Tobacco comment: social  Vaping Use  . Vaping Use: Never used  Substance and Sexual Activity  . Alcohol use: No  . Drug use: No  . Sexual activity: Not Currently  Other Topics Concern  . Not on file  Social History Narrative  . Not on file   Social Determinants of Health   Financial Resource Strain:   . Difficulty of Paying Living Expenses:   Food Insecurity:   . Worried About Charity fundraiser in the Last Year:   . Arboriculturist in the Last Year:   Transportation Needs:   . Film/video editor (Medical):   Marland Kitchen Lack of Transportation (Non-Medical):   Physical Activity:   . Days of Exercise per Week:   . Minutes of Exercise per Session:   Stress:   .  Feeling of Stress :   Social Connections:   . Frequency of Communication with Friends and Family:   . Frequency of Social Gatherings with Friends and Family:   . Attends Religious Services:   . Active Member of Clubs or Organizations:   . Attends Archivist Meetings:   Marland Kitchen Marital Status:     Current Medications:  Current Outpatient Medications:  .  albuterol (VENTOLIN HFA) 108 (90 Base) MCG/ACT inhaler, Inhale 2 puffs into the lungs every 6 (six) hours as needed for wheezing or shortness of breath., Disp: , Rfl:  .  aspirin 81 MG chewable tablet, Chew 81 mg by mouth daily., Disp: , Rfl:  .  cholecalciferol (VITAMIN D3) 25 MCG (1000 UT) tablet, Take 2,000 Units by mouth every evening., Disp: , Rfl:  .  conjugated estrogens (PREMARIN) vaginal cream, Place 1 Applicatorful vaginally 3 (three) times a week., Disp: 42.5 g, Rfl: 1 .  hydrochlorothiazide (HYDRODIURIL) 25 MG tablet, Take 12.5 mg by mouth daily. , Disp: , Rfl:  .  levothyroxine (SYNTHROID) 75 MCG tablet, Take 75 mcg by mouth daily before breakfast., Disp: , Rfl:  .  lidocaine (XYLOCAINE) 5 % ointment, APPLY TO AFFECTED AREA 3 TIMES A DAY AS NEEDED (TO VULVA) (Patient taking differently: Apply 1 application topically daily as needed for mild pain. ), Disp: 1 g, Rfl: 0 .  Omega-3 Fatty Acids (FISH OIL) 1200 MG CAPS, Take 6,000 mg by mouth every evening. , Disp: , Rfl:  .  Oxcarbazepine (TRILEPTAL) 300 MG tablet, Take 150 mg by mouth 2 (two) times daily as needed (Trigeminal neuralgia). , Disp: , Rfl:  .  rosuvastatin (CRESTOR) 5 MG tablet, Take 5 mg by mouth at bedtime., Disp: , Rfl:  .  senna-docusate (SENOKOT-S) 8.6-50 MG tablet, Take 2 tablets by mouth at bedtime. Do not take if having diarrhea, you can stop when on normal bowel routine, Disp: 30 tablet, Rfl: 1  Review of Systems: Pertinent positives as per HPI.  Overall endorses intermittent vulvar irritation. Denies appetite changes, fevers, chills, fatigue, unexplained  weight changes. Denies hearing loss, neck lumps or masses, mouth sores, ringing in ears or voice changes. Denies cough or wheezing.  Denies shortness of breath. Denies chest pain or palpitations. Denies leg swelling. Denies abdominal distention, pain, blood in stools, constipation, diarrhea, nausea, vomiting, or early satiety. Denies pain with intercourse, dysuria, frequency, hematuria or incontinence. Denies hot flashes, pelvic pain, vaginal bleeding or vaginal discharge.   Denies joint pain, back pain or muscle pain/cramps. Denies itching, rash, or wounds. Denies dizziness, headaches, numbness or seizures. Denies swollen lymph nodes or glands, denies easy bruising or bleeding. Denies anxiety, depression, confusion, or decreased concentration.  Physical Exam: BP (!) 158/91 (BP Location: Left Arm, Patient Position: Sitting)   Pulse 72   Temp 98.1 F (36.7 C) (Tympanic)   Resp 16   Ht 5' 5.5" (1.664 m)   Wt 171 lb 6.4 oz (77.7 kg)   SpO2 97%   BMI 28.09 kg/m  General: Alert, oriented, no acute distress. HEENT: Normocephalic, atraumatic, sclera anicteric. Chest: Unlabored breathing on room air. Extremities: Grossly normal range of motion.  Warm, well perfused.  No edema bilaterally. Skin: No rashes or lesions noted. GU: Erythema continues to improve.  There are 2 areas with desquamation on the right upper labia just lateral to the area of agglutination.  No atypical vasculature seen.  No significant erythema seen on the vulva.  Laboratory & Radiologic Studies: None new  Assessment & Plan: [name] is a 79 y.o. woman with Stage 1B vulvar cancertreated surgically in 2020 now approximately s/p vulvar biopsies, simple vulvectomy and laser ablation in 06/2019 for vaginal dysplasia.  The patient's exam continues to improve now on vaginal estrogen for the last 2 months.  We are using this to treat low-grade dysplasia.  She is using the cream every other night which I have encouraged  her to continue doing.  We will plan to reevaluate in 3 months.  We discussed signs and symptoms that should prompt a phone call to be seen sooner than that.  We will plan to perform vulvoscopy as well as an internal exam at her next visit.  22 minutes of total time was spent for this patient encounter, including preparation, face-to-face counseling with the patient and coordination of care, and documentation of the encounter.  Jeral Pinch, MD  Division of Gynecologic Oncology  Department of Obstetrics and Gynecology  [facility]

## 2020-01-23 ENCOUNTER — Encounter: Payer: Self-pay | Admitting: Gynecologic Oncology

## 2020-01-23 ENCOUNTER — Inpatient Hospital Stay: Payer: Medicare Other | Attending: Gynecologic Oncology | Admitting: Gynecologic Oncology

## 2020-01-23 ENCOUNTER — Other Ambulatory Visit: Payer: Self-pay

## 2020-01-23 VITALS — BP 147/70 | HR 74 | Temp 96.4°F | Resp 18 | Wt 171.0 lb

## 2020-01-23 DIAGNOSIS — Z79899 Other long term (current) drug therapy: Secondary | ICD-10-CM | POA: Diagnosis not present

## 2020-01-23 DIAGNOSIS — Z7982 Long term (current) use of aspirin: Secondary | ICD-10-CM | POA: Diagnosis not present

## 2020-01-23 DIAGNOSIS — E785 Hyperlipidemia, unspecified: Secondary | ICD-10-CM | POA: Diagnosis not present

## 2020-01-23 DIAGNOSIS — Z87891 Personal history of nicotine dependence: Secondary | ICD-10-CM | POA: Diagnosis not present

## 2020-01-23 DIAGNOSIS — N893 Dysplasia of vagina, unspecified: Secondary | ICD-10-CM | POA: Insufficient documentation

## 2020-01-23 DIAGNOSIS — N903 Dysplasia of vulva, unspecified: Secondary | ICD-10-CM | POA: Diagnosis not present

## 2020-01-23 DIAGNOSIS — J45909 Unspecified asthma, uncomplicated: Secondary | ICD-10-CM | POA: Insufficient documentation

## 2020-01-23 DIAGNOSIS — Z8541 Personal history of malignant neoplasm of cervix uteri: Secondary | ICD-10-CM | POA: Insufficient documentation

## 2020-01-23 DIAGNOSIS — C519 Malignant neoplasm of vulva, unspecified: Secondary | ICD-10-CM

## 2020-01-23 DIAGNOSIS — Z9079 Acquired absence of other genital organ(s): Secondary | ICD-10-CM | POA: Insufficient documentation

## 2020-01-23 DIAGNOSIS — I10 Essential (primary) hypertension: Secondary | ICD-10-CM | POA: Insufficient documentation

## 2020-01-23 DIAGNOSIS — E039 Hypothyroidism, unspecified: Secondary | ICD-10-CM | POA: Insufficient documentation

## 2020-01-23 DIAGNOSIS — N900 Mild vulvar dysplasia: Secondary | ICD-10-CM | POA: Diagnosis not present

## 2020-01-23 MED ORDER — ESTROGENS, CONJUGATED 0.625 MG/GM VA CREA: TOPICAL_CREAM | VAGINAL | 1 refills | Status: DC

## 2020-01-23 NOTE — Patient Instructions (Signed)
Continue use of estrogen cream to the vulva. Plan to follow up in three months or sooner if needed. Please call for any worsening or new symptoms.

## 2020-01-23 NOTE — Progress Notes (Signed)
Gynecologic Oncology Return Clinic Visit  01/23/20  Reason for Visit: surveillance after surgical treatment of recurrent vulvar dysplasia  Treatment History: She had a palpablevulvarlump in the winter 2019 and 2020. It became more noticeable in April 2020 and she attempted to have an appointment with a gynecologist however she could not get an appointment due to the the coronavirus shot downs. She was eventually able to see a physician on August 25, 2018 at which time evaluation of the vulva confirmed a vulvar mass. 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  Remote history of cervical cancer diagnosed in 2008. 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 Marti Sleigh.   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 09/27/18:Dr Gehrigperformed aradical anterior vulvectomy. 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 The resection margins were negative for carcinoma with the closest being the lateral margin 1:00 at 0.8 cm. The carcinoma invaded 1.3 cm depth. There was no lymphovascular or perineural invasion identified.  Postoperatively a PET/CT was obtained on October 06, 2018. This revealed postoperative changes to the vulva. There was no hypermetabolic local regional lymphadenopathy. There is a questionable subtle focus of hypermetabolism in the left lobe of the liver, indeterminate for liver metastases. MRI abdomen could be performed for further work-up. No other potential findings of hypermetabolic distant metastatic disease were seen.  On 10/25/2018, the patient underwent bilateral superficial inguinal lymphadenectomy with fibroadipose tissue noted on the left and no carcinoma seen in 2 of 2 lymph nodes on the right.  The patient was seen in November with vulvar irritation that was intermittent in nature. 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 She was started on triamcinolone twice a day until her symptoms improved and then transition to daily. I saw her at the end of December and she felt some improvement in her vulvar tenderness but still had significant erythema and irritation of the vulva. I asked her to add Desitin or similar barrier cream. When I saw her again in February, she had no improvement and had had significant irritation with the Desitin. We stopped all topical treatmentaltogether.  ON 3/25, given persistent skin changes and no improvement in symptoms, vulvar biopsied was performed. Biopsy - high grade dysplasia.  06/15/19: partial simple left vulvectomy, partial simple peri-clitoral vulvectomy, vulvar biopsies, extensive CO2 laser ablation of the vulva. VIN3 noted in two vulvectomy specimens, positive margins of left specimen. Biopsies showed VIN1.  Interval History: Has intermittent burning on her vulva.  Denies any associated pruritus, pain, bleeding, or discharge.  He is using the vaginal estrogen cream every other night.  Reports regular bowel and bladder function.  Past Medical/Surgical History: Past Medical History:  Diagnosis Date  . Asthma    mild  . 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 Complication of anesthesia    slow to wake up /  body temperature goes down   warm blankets with 1st surgery only  . Hyperlipidemia   . Hypertension   . Hypothyroidism   . Pneumonia last time 2000  . PONV (postoperative nausea and vomiting)   . 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 ABDOMINAL HYSTERECTOMY  2008   Pelvic lymphadenectomy  . BREAST SURGERY     left breast  cyst in areolar area 46 years ago  . CO2 LASER APPLICATION N/A 09/14/3901   Procedure: C02 LASER OF VULVAR, VULVAR BIOPSIES,  SIMPLE VULVECTOMY;  Surgeon:  Lafonda Mosses, MD;  Location: [facility];  Service: Gynecology;  Laterality: N/A;  . LYMPH NODE DISSECTION Bilateral 10/25/2018   Procedure: LYMPH NODE DISSECTION,BILATERAL INGUINOFEMORAL LYMPHADECTOMY;  Surgeon: [name], MD;  Location: [facility];  Service: Gynecology;  Laterality: Bilateral;  . RADICAL VULVECTOMY N/A 09/27/2018   Procedure: RADICAL VULVECTOMY;  Surgeon: [name], MD;  Location: [facility];  Service: Gynecology;  Laterality: N/A;  . SUPRAPUBIC CATHETER PLACEMENT     with 2008 surgery, later removed    Family History  Problem Relation Age of Onset  . Skin cancer Father     Social History   Socioeconomic History  . Marital status: Widowed    Spouse name: Not on file  . Number of children: Not on file  . Years of education: Not on file  . Highest education level: Not on file  Occupational History  . Not on file  Tobacco Use  . Smoking status: Former Smoker    Types: Cigarettes    Quit date: 12/08/2003    Years since quitting: 16.1  . Smokeless tobacco: Never Used  . Tobacco comment: social  Vaping Use  . Vaping Use: Never used  Substance and Sexual Activity  . Alcohol use: No  . Drug use: No  . Sexual activity: Not Currently  Other Topics Concern  . Not on file  Social History Narrative  . Not on file   Social Determinants of Health   Financial Resource Strain:   . Difficulty of Paying Living Expenses: Not on file  Food Insecurity:   . Worried About Charity fundraiser in the Last Year: Not on file  . Ran Out of Food in the Last Year: Not on file  Transportation Needs:   . Lack of Transportation (Medical): Not on file  . Lack of Transportation (Non-Medical): Not on file  Physical Activity:   . Days of Exercise per Week: Not on file  . Minutes of Exercise per Session: Not on file  Stress:   . Feeling of Stress : Not on file  Social Connections:   . Frequency of Communication with Friends and Family: Not on file  .  Frequency of Social Gatherings with Friends and Family: Not on file  . Attends Religious Services: Not on file  . Active Member of Clubs or Organizations: Not on file  . Attends Archivist Meetings: Not on file  . Marital Status: Not on file    Current Medications:  Current Outpatient Medications:  .  albuterol (VENTOLIN HFA) 108 (90 Base) MCG/ACT inhaler, Inhale 2 puffs into the lungs every 6 (six) hours as needed for wheezing or shortness of breath., Disp: , Rfl:  .  aspirin 81 MG chewable tablet, Chew 81 mg by mouth daily., Disp: , Rfl:  .  cholecalciferol (VITAMIN D3) 25 MCG (1000 UT) tablet, Take 2,000 Units by mouth every evening., Disp: , Rfl:  .  conjugated estrogens (PREMARIN) vaginal cream, Apply dime size amount to the vulva three times a week, Disp: 42.5 g, Rfl: 1 .  hydrochlorothiazide (HYDRODIURIL) 25 MG tablet, Take 12.5 mg by mouth daily. , Disp: , Rfl:  .  levothyroxine (SYNTHROID) 75 MCG tablet, Take 75 mcg by mouth daily before breakfast., Disp: , Rfl:  .  lidocaine (XYLOCAINE) 5 % ointment, APPLY TO AFFECTED  AREA 3 TIMES A DAY AS NEEDED (TO VULVA) (Patient taking differently: Apply 1 application topically daily as needed for mild pain. ), Disp: 1 g, Rfl: 0 .  Omega-3 Fatty Acids (FISH OIL) 1200 MG CAPS, Take 6,000 mg by mouth every evening. , Disp: , Rfl:  .  Oxcarbazepine (TRILEPTAL) 300 MG tablet, Take 150 mg by mouth 2 (two) times daily as needed (Trigeminal neuralgia). , Disp: , Rfl:  .  rosuvastatin (CRESTOR) 5 MG tablet, Take 5 mg by mouth at bedtime., Disp: , Rfl:  .  senna-docusate (SENOKOT-S) 8.6-50 MG tablet, Take 2 tablets by mouth at bedtime. Do not take if having diarrhea, you can stop when on normal bowel routine, Disp: 30 tablet, Rfl: 1  Review of Systems: Denies appetite changes, fevers, chills, fatigue, unexplained weight changes. Denies hearing loss, neck lumps or masses, mouth sores, ringing in ears or voice changes. Denies cough or wheezing.   Denies shortness of breath. Denies chest pain or palpitations. Denies leg swelling. Denies abdominal distention, pain, blood in stools, constipation, diarrhea, nausea, vomiting, or early satiety. Denies pain with intercourse, dysuria, frequency, hematuria or incontinence. Denies hot flashes, pelvic pain, vaginal bleeding or vaginal discharge.   Denies joint pain, back pain or muscle pain/cramps. Denies itching, rash, or wounds. Denies dizziness, headaches, numbness or seizures. Denies swollen lymph nodes or glands, denies easy bruising or bleeding. Denies anxiety, depression, confusion, or decreased concentration.  Physical Exam: BP (!) 147/70 (BP Location: Right Arm, Patient Position: Sitting)   Pulse 74   Temp (!) 96.4 F (35.8 C) (Tympanic)   Resp 18   Wt 171 lb (77.6 kg)   SpO2 96%   BMI 28.02 kg/m  General: Alert, oriented, no acute distress. HEENT: Atraumatic, normocephalic, sclera anicteric. Chest: Unlabored breathing on room air.  Lymphatics: No inguinal adenopathy. GU: External genitalia with visible changes from patient's prior surgeries.  Erythema is overall improved.  She continues to have several small areas of desquamation, a 1.5 cm area on the right upper vulva, a 5 mm area at the apex of agglutinated tissue on the right, and a 5 mm to 10 mm area on the left superior vulva.  No leukoplakia or atypical vasculature noted  Laboratory & Radiologic Studies: None new  Assessment & Plan: Keleigh Kazee is a 79 y.o. woman with Stage 1B vulvar cancertreated surgically in 2020 now approximatelys/p vulvar biopsies, simple vulvectomy and laser ablation in 06/2019 for vaginal dysplasia.  The patient's exam appears stable over the last 3 months with continued vaginal estrogen use.  Pictures taken today.  She has several areas of desquamation that I suspect are low-grade dysplasia.  Depending on her symptoms and exam in 3 months, we discussed reconsideration of additional topical  therapy  versus laser ablation.  We discussed signs and symptoms that should prompt a phone call to be seen sooner than that.  We will plan to perform vulvoscopy as well as an internal exam at her next visit.  25 minutes of total time was spent for this patient encounter, including preparation, face-to-face counseling with the patient and coordination of care, and documentation of the encounter.  Jeral Pinch, MD  Division of Gynecologic Oncology  Department of Obstetrics and Gynecology  [facility]

## 2020-04-16 ENCOUNTER — Telehealth: Payer: Self-pay | Admitting: *Deleted

## 2020-04-16 NOTE — Telephone Encounter (Signed)
Called and moved the patient form 2/14 to 2/15

## 2020-04-22 ENCOUNTER — Ambulatory Visit: Payer: Medicare Other | Admitting: Gynecologic Oncology

## 2020-04-22 NOTE — H&P (View-Only) (Signed)
Gynecologic Oncology Return Clinic Visit  04/23/20  Reason for Visit: surveillance after surgical treatment of recurrent vulvar dysplasia  Treatment History: She had a palpablevulvarlump in the winter 2019 and 2020. It became more noticeable in April 2020 and she attempted to have an appointment with a gynecologist however she could not get an appointment due to the the coronavirus shot downs. She was eventually able to see a physician on August 25, 2018 at which time evaluation of the vulva confirmed a vulvar mass. 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  Remote history of cervical cancer diagnosed in 2008. 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 Marti Sleigh.   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 09/27/18:Dr Gehrigperformed aradical anterior vulvectomy. 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 The resection margins were negative for carcinoma with the closest being the lateral margin 1:00 at 0.8 cm. The carcinoma invaded 1.3 cm depth. There was no lymphovascular or perineural invasion identified.  Postoperatively a PET/CT was obtained on October 06, 2018. This revealed postoperative changes to the vulva. There was no hypermetabolic local regional lymphadenopathy. There is a questionable subtle focus of hypermetabolism in the left lobe of the liver, indeterminate for liver metastases. MRI abdomen could be performed for further work-up. No other potential findings of hypermetabolic distant metastatic disease were seen.  On 10/25/2018, the patient underwent bilateral superficial inguinal lymphadenectomy with fibroadipose tissue noted on the left and no carcinoma seen in 2 of 2 lymph nodes on the right.  The patient was seen in November with vulvar irritation that was intermittent in nature. 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 She was started on triamcinolone twice a day until her symptoms improved and then transition to daily. I saw her at the end of December and she felt some improvement in her vulvar tenderness but still had significant erythema and irritation of the vulva. I asked her to add Desitin or similar barrier cream. When I saw her again in February, she had no improvement and had had significant irritation with the Desitin. We stopped all topical treatmentaltogether.  ON 3/25, given persistent skin changes and no improvement in symptoms, vulvar biopsied was performed. Biopsy - high grade dysplasia.  06/15/19: partial simple left vulvectomy, partial simple peri-clitoral vulvectomy, vulvar biopsies, extensive CO2 laser ablation of the vulva. VIN3 noted in two vulvectomy specimens, positive margins of left specimen. Biopsies showed VIN1.  Treatment with vaginal/vulvar estrogen since surgery.  Interval History: Overall doing well.  Denies much change since her last visit.  Using vaginal estrogen cream every other day at night.  Has some burning on her vulva after she urinates.  She continues to use a squirt bottle to rinse off after urinating.  She denies any bleeding or discharge.  Reports baseline bowel and bladder function.  Past Medical/Surgical History: Past Medical History:  Diagnosis Date  . Asthma    mild  . 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 Complication of anesthesia    slow to wake up /  body temperature goes down   warm blankets with 1st surgery only  . Hyperlipidemia   . Hypertension   . Hypothyroidism   . Pneumonia last time 2000  . PONV (postoperative nausea and vomiting)   . 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 ABDOMINAL HYSTERECTOMY  2008   Pelvic lymphadenectomy  . BREAST SURGERY      left breast cyst in areolar area 46 years ago  . CO2 LASER APPLICATION N/A 0/03/7508   Procedure: C02 LASER OF VULVAR, VULVAR BIOPSIES,  SIMPLE VULVECTOMY;  Surgeon: Lafonda Mosses, MD;  Location: [facility];  Service: Gynecology;  Laterality: N/A;  . LYMPH NODE DISSECTION Bilateral 10/25/2018   Procedure: LYMPH NODE DISSECTION,BILATERAL INGUINOFEMORAL LYMPHADECTOMY;  Surgeon: [name], MD;  Location: [facility];  Service: Gynecology;  Laterality: Bilateral;  . RADICAL VULVECTOMY N/A 09/27/2018   Procedure: RADICAL VULVECTOMY;  Surgeon: [name], MD;  Location: [facility];  Service: Gynecology;  Laterality: N/A;  . SUPRAPUBIC CATHETER PLACEMENT     with 2008 surgery, later removed    Family History  Problem Relation Age of Onset  . Skin cancer Father     Social History   Socioeconomic History  . Marital status: Widowed    Spouse name: Not on file  . Number of children: Not on file  . Years of education: Not on file  . Highest education level: Not on file  Occupational History  . Not on file  Tobacco Use  . Smoking status: Former Smoker    Types: Cigarettes    Quit date: 12/08/2003    Years since quitting: 16.3  . Smokeless tobacco: Never Used  . Tobacco comment: social  Vaping Use  . Vaping Use: Never used  Substance and Sexual Activity  . Alcohol use: No  . Drug use: No  . Sexual activity: Not Currently  Other Topics Concern  . Not on file  Social History Narrative  . Not on file   Social Determinants of Health   Financial Resource Strain: Not on file  Food Insecurity: Not on file  Transportation Needs: Not on file  Physical Activity: Not on file  Stress: Not on file  Social Connections: Not on file    Current Medications:  Current Outpatient Medications:  .  aspirin 81 MG chewable tablet, Chew 81 mg by mouth daily., Disp: , Rfl:  .  cholecalciferol (VITAMIN D3) 25 MCG (1000 UT) tablet, Take 2,000 Units by mouth every evening., Disp: ,  Rfl:  .  conjugated estrogens (PREMARIN) vaginal cream, Apply dime size amount to the vulva three times a week, Disp: 42.5 g, Rfl: 1 .  hydrochlorothiazide (HYDRODIURIL) 25 MG tablet, Take 12.5 mg by mouth daily. , Disp: , Rfl:  .  levothyroxine (SYNTHROID) 75 MCG tablet, Take 75 mcg by mouth daily before breakfast., Disp: , Rfl:  .  Omega-3 Fatty Acids (FISH OIL) 1200 MG CAPS, Take 6,000 mg by mouth every evening. , Disp: , Rfl:  .  Oxcarbazepine (TRILEPTAL) 300 MG tablet, Take 150 mg by mouth 2 (two) times daily as needed (Trigeminal neuralgia). , Disp: , Rfl:  .  rosuvastatin (CRESTOR) 5 MG tablet, Take 5 mg by mouth at bedtime., Disp: , Rfl:  .  albuterol (VENTOLIN HFA) 108 (90 Base) MCG/ACT inhaler, Inhale 2 puffs into the lungs every 6 (six) hours as needed for wheezing or shortness of breath. (Patient not taking: Reported on 04/23/2020), Disp: , Rfl:  .  lidocaine (XYLOCAINE) 5 % ointment, APPLY TO AFFECTED AREA 3 TIMES A DAY AS NEEDED (TO VULVA) (Patient not taking: Reported on 04/23/2020), Disp: 1 g, Rfl: 0 .  senna-docusate (SENOKOT-S) 8.6-50 MG tablet, Take 2 tablets by  mouth at bedtime. Do not take if having diarrhea, you can stop when on normal bowel routine (Patient not taking: Reported on 04/23/2020), Disp: 30 tablet, Rfl: 1  Review of Systems: Denies appetite changes, fevers, chills, fatigue, unexplained weight changes. Denies hearing loss, neck lumps or masses, mouth sores, ringing in ears or voice changes. Denies cough or wheezing.  Denies shortness of breath. Denies chest pain or palpitations. Denies leg swelling. Denies abdominal distention, pain, blood in stools, constipation, diarrhea, nausea, vomiting, or early satiety. Denies pain with intercourse, frequency, hematuria or incontinence. Denies hot flashes, vaginal bleeding or vaginal discharge.   Denies joint pain, back pain or muscle pain/cramps. Denies itching, rash, or wounds. Denies dizziness, headaches, numbness or  seizures. Denies swollen lymph nodes or glands, denies easy bruising or bleeding. Denies anxiety, depression, confusion, or decreased concentration.  Physical Exam: BP 137/78 (BP Location: Right Arm, Patient Position: Sitting)   Pulse 82   Temp (!) 96.8 F (36 C) (Tympanic)   Resp 20   Ht 5\' 5"  (1.651 m)   Wt 173 lb (78.5 kg)   SpO2 97% Comment: RA  BMI 28.79 kg/m  General: Alert, oriented, no acute distress. HEENT: Normocephalic, atraumatic, sclera anicteric. Chest: Unlabored breathing on room air. Abdomen: soft, nontender.  Normoactive bowel sounds.  No masses or hepatosplenomegaly appreciated.  Periumbilical hernia. Extremities: Grossly normal range of motion.  Warm, well perfused.  No edema bilaterally. Lymphatics: No cervical, supraclavicular, or inguinal adenopathy. GU: External genitalia with some mild bilateral erythema on the vulva.  Be to areas of desquamation on the right vulva have now converged and are approximately 4 cm x 2 cm.  There is an additional 1 x 1 cm area of desquamation on the left.  No additional skin changes, leukoplakia or atypical vasculature noted.  Pictures taken and in media.  Laboratory & Radiologic Studies: None new  Assessment & Plan: [name] is a 80 y.o. woman with Stage 1B vulvar cancertreated surgically in 2020 now approximatelys/p vulvar biopsies,simplevulvectomyand laser ablation in 06/2019 for vaginal dysplasia now on vaginal estrogen therapy with increasing size of desquamative skin changes.  Given some increase in areas of desquamation as well as my inability to apply acetic acid (due to patient discomfort) and perform biopsies today, I recommend surgery in the next month or 2 for an exam under anesthesia and vulvar biopsies.  I also think that it may be worth performing laser ablation of the desquamative areas, that I suspect are low-grade dysplasia, to see if we can cause in immune reaction to help with healing.  We discussed plan  for exam under anesthesia, vulvar biopsies, and laser ablation of the vulva on March 3.  Risks of the procedure were discussed with the patient including bleeding, infection, need for blood transfusion, damage to surrounding organs, medical complications related to surgery and anesthesia including stroke, heart attack, pneumonia, and rarely death.  Postoperative medication sent to the patient's pharmacy.  Perioperative instructions reviewed today.  38 minutes of total time was spent for this patient encounter, including preparation, face-to-face counseling with the patient and coordination of care, and documentation of the encounter.  Jeral Pinch, MD  Division of Gynecologic Oncology  Department of Obstetrics and Gynecology  [facility]

## 2020-04-22 NOTE — Progress Notes (Signed)
Gynecologic Oncology Return Clinic Visit  04/23/20  Reason for Visit: surveillance after surgical treatment of recurrent vulvar dysplasia  Treatment History: She had a palpablevulvarlump in the winter 2019 and 2020. It became more noticeable in April 2020 and she attempted to have an appointment with a gynecologist however she could not get an appointment due to the the coronavirus shot downs. She was eventually able to see a physician on August 25, 2018 at which time evaluation of the vulva confirmed a vulvar mass. 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  Remote history of cervical cancer diagnosed in 2008. 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 Marti Sleigh.   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 09/27/18:Dr Gehrigperformed aradical anterior vulvectomy. 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 The resection margins were negative for carcinoma with the closest being the lateral margin 1:00 at 0.8 cm. The carcinoma invaded 1.3 cm depth. There was no lymphovascular or perineural invasion identified.  Postoperatively a PET/CT was obtained on October 06, 2018. This revealed postoperative changes to the vulva. There was no hypermetabolic local regional lymphadenopathy. There is a questionable subtle focus of hypermetabolism in the left lobe of the liver, indeterminate for liver metastases. MRI abdomen could be performed for further work-up. No other potential findings of hypermetabolic distant metastatic disease were seen.  On 10/25/2018, the patient underwent bilateral superficial inguinal lymphadenectomy with fibroadipose tissue noted on the left and no carcinoma seen in 2 of 2 lymph nodes on the right.  The patient was seen in November with vulvar irritation that was intermittent in nature. 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 She was started on triamcinolone twice a day until her symptoms improved and then transition to daily. I saw her at the end of December and she felt some improvement in her vulvar tenderness but still had significant erythema and irritation of the vulva. I asked her to add Desitin or similar barrier cream. When I saw her again in February, she had no improvement and had had significant irritation with the Desitin. We stopped all topical treatmentaltogether.  ON 3/25, given persistent skin changes and no improvement in symptoms, vulvar biopsied was performed. Biopsy - high grade dysplasia.  06/15/19: partial simple left vulvectomy, partial simple peri-clitoral vulvectomy, vulvar biopsies, extensive CO2 laser ablation of the vulva. VIN3 noted in two vulvectomy specimens, positive margins of left specimen. Biopsies showed VIN1.  Treatment with vaginal/vulvar estrogen since surgery.  Interval History: Overall doing well.  Denies much change since her last visit.  Using vaginal estrogen cream every other day at night.  Has some burning on her vulva after she urinates.  She continues to use a squirt bottle to rinse off after urinating.  She denies any bleeding or discharge.  Reports baseline bowel and bladder function.  Past Medical/Surgical History: Past Medical History:  Diagnosis Date  . Asthma    mild  . 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 Complication of anesthesia    slow to wake up /  body temperature goes down   warm blankets with 1st surgery only  . Hyperlipidemia   . Hypertension   . Hypothyroidism   . Pneumonia last time 2000  . PONV (postoperative nausea and vomiting)   . 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 ABDOMINAL HYSTERECTOMY  2008   Pelvic lymphadenectomy  . BREAST SURGERY      left breast cyst in areolar area 46 years ago  . CO2 LASER APPLICATION N/A 07/15/5636   Procedure: C02 LASER OF VULVAR, VULVAR BIOPSIES,  SIMPLE VULVECTOMY;  Surgeon: Lafonda Mosses, MD;  Location: [facility];  Service: Gynecology;  Laterality: N/A;  . LYMPH NODE DISSECTION Bilateral 10/25/2018   Procedure: LYMPH NODE DISSECTION,BILATERAL INGUINOFEMORAL LYMPHADECTOMY;  Surgeon: [name], MD;  Location: [facility];  Service: Gynecology;  Laterality: Bilateral;  . RADICAL VULVECTOMY N/A 09/27/2018   Procedure: RADICAL VULVECTOMY;  Surgeon: [name], MD;  Location: [facility];  Service: Gynecology;  Laterality: N/A;  . SUPRAPUBIC CATHETER PLACEMENT     with 2008 surgery, later removed    Family History  Problem Relation Age of Onset  . Skin cancer Father     Social History   Socioeconomic History  . Marital status: Widowed    Spouse name: Not on file  . Number of children: Not on file  . Years of education: Not on file  . Highest education level: Not on file  Occupational History  . Not on file  Tobacco Use  . Smoking status: Former Smoker    Types: Cigarettes    Quit date: 12/08/2003    Years since quitting: 16.3  . Smokeless tobacco: Never Used  . Tobacco comment: social  Vaping Use  . Vaping Use: Never used  Substance and Sexual Activity  . Alcohol use: No  . Drug use: No  . Sexual activity: Not Currently  Other Topics Concern  . Not on file  Social History Narrative  . Not on file   Social Determinants of Health   Financial Resource Strain: Not on file  Food Insecurity: Not on file  Transportation Needs: Not on file  Physical Activity: Not on file  Stress: Not on file  Social Connections: Not on file    Current Medications:  Current Outpatient Medications:  .  aspirin 81 MG chewable tablet, Chew 81 mg by mouth daily., Disp: , Rfl:  .  cholecalciferol (VITAMIN D3) 25 MCG (1000 UT) tablet, Take 2,000 Units by mouth every evening., Disp: ,  Rfl:  .  conjugated estrogens (PREMARIN) vaginal cream, Apply dime size amount to the vulva three times a week, Disp: 42.5 g, Rfl: 1 .  hydrochlorothiazide (HYDRODIURIL) 25 MG tablet, Take 12.5 mg by mouth daily. , Disp: , Rfl:  .  levothyroxine (SYNTHROID) 75 MCG tablet, Take 75 mcg by mouth daily before breakfast., Disp: , Rfl:  .  Omega-3 Fatty Acids (FISH OIL) 1200 MG CAPS, Take 6,000 mg by mouth every evening. , Disp: , Rfl:  .  Oxcarbazepine (TRILEPTAL) 300 MG tablet, Take 150 mg by mouth 2 (two) times daily as needed (Trigeminal neuralgia). , Disp: , Rfl:  .  rosuvastatin (CRESTOR) 5 MG tablet, Take 5 mg by mouth at bedtime., Disp: , Rfl:  .  albuterol (VENTOLIN HFA) 108 (90 Base) MCG/ACT inhaler, Inhale 2 puffs into the lungs every 6 (six) hours as needed for wheezing or shortness of breath. (Patient not taking: Reported on 04/23/2020), Disp: , Rfl:  .  lidocaine (XYLOCAINE) 5 % ointment, APPLY TO AFFECTED AREA 3 TIMES A DAY AS NEEDED (TO VULVA) (Patient not taking: Reported on 04/23/2020), Disp: 1 g, Rfl: 0 .  senna-docusate (SENOKOT-S) 8.6-50 MG tablet, Take 2 tablets by  mouth at bedtime. Do not take if having diarrhea, you can stop when on normal bowel routine (Patient not taking: Reported on 04/23/2020), Disp: 30 tablet, Rfl: 1  Review of Systems: Denies appetite changes, fevers, chills, fatigue, unexplained weight changes. Denies hearing loss, neck lumps or masses, mouth sores, ringing in ears or voice changes. Denies cough or wheezing.  Denies shortness of breath. Denies chest pain or palpitations. Denies leg swelling. Denies abdominal distention, pain, blood in stools, constipation, diarrhea, nausea, vomiting, or early satiety. Denies pain with intercourse, frequency, hematuria or incontinence. Denies hot flashes, vaginal bleeding or vaginal discharge.   Denies joint pain, back pain or muscle pain/cramps. Denies itching, rash, or wounds. Denies dizziness, headaches, numbness or  seizures. Denies swollen lymph nodes or glands, denies easy bruising or bleeding. Denies anxiety, depression, confusion, or decreased concentration.  Physical Exam: BP 137/78 (BP Location: Right Arm, Patient Position: Sitting)   Pulse 82   Temp (!) 96.8 F (36 C) (Tympanic)   Resp 20   Ht 5\' 5"  (1.651 m)   Wt 173 lb (78.5 kg)   SpO2 97% Comment: RA  BMI 28.79 kg/m  General: Alert, oriented, no acute distress. HEENT: Normocephalic, atraumatic, sclera anicteric. Chest: Unlabored breathing on room air. Abdomen: soft, nontender.  Normoactive bowel sounds.  No masses or hepatosplenomegaly appreciated.  Periumbilical hernia. Extremities: Grossly normal range of motion.  Warm, well perfused.  No edema bilaterally. Lymphatics: No cervical, supraclavicular, or inguinal adenopathy. GU: External genitalia with some mild bilateral erythema on the vulva.  Be to areas of desquamation on the right vulva have now converged and are approximately 4 cm x 2 cm.  There is an additional 1 x 1 cm area of desquamation on the left.  No additional skin changes, leukoplakia or atypical vasculature noted.  Pictures taken and in media.  Laboratory & Radiologic Studies: None new  Assessment & Plan: [name] is a 80 y.o. woman with Stage 1B vulvar cancertreated surgically in 2020 now approximatelys/p vulvar biopsies,simplevulvectomyand laser ablation in 06/2019 for vaginal dysplasia now on vaginal estrogen therapy with increasing size of desquamative skin changes.  Given some increase in areas of desquamation as well as my inability to apply acetic acid (due to patient discomfort) and perform biopsies today, I recommend surgery in the next month or 2 for an exam under anesthesia and vulvar biopsies.  I also think that it may be worth performing laser ablation of the desquamative areas, that I suspect are low-grade dysplasia, to see if we can cause in immune reaction to help with healing.  We discussed plan  for exam under anesthesia, vulvar biopsies, and laser ablation of the vulva on March 3.  Risks of the procedure were discussed with the patient including bleeding, infection, need for blood transfusion, damage to surrounding organs, medical complications related to surgery and anesthesia including stroke, heart attack, pneumonia, and rarely death.  Postoperative medication sent to the patient's pharmacy.  Perioperative instructions reviewed today.  38 minutes of total time was spent for this patient encounter, including preparation, face-to-face counseling with the patient and coordination of care, and documentation of the encounter.  Jeral Pinch, MD  Division of Gynecologic Oncology  Department of Obstetrics and Gynecology  [facility] of [facility]

## 2020-04-23 ENCOUNTER — Encounter: Payer: Self-pay | Admitting: Gynecologic Oncology

## 2020-04-23 ENCOUNTER — Other Ambulatory Visit: Payer: Self-pay

## 2020-04-23 ENCOUNTER — Inpatient Hospital Stay: Payer: Medicare Other | Attending: Gynecologic Oncology | Admitting: Gynecologic Oncology

## 2020-04-23 VITALS — BP 137/78 | HR 82 | Temp 96.8°F | Resp 20 | Ht 65.0 in | Wt 173.0 lb

## 2020-04-23 DIAGNOSIS — Z90710 Acquired absence of both cervix and uterus: Secondary | ICD-10-CM | POA: Insufficient documentation

## 2020-04-23 DIAGNOSIS — C519 Malignant neoplasm of vulva, unspecified: Secondary | ICD-10-CM

## 2020-04-23 DIAGNOSIS — I10 Essential (primary) hypertension: Secondary | ICD-10-CM | POA: Diagnosis not present

## 2020-04-23 DIAGNOSIS — Z7982 Long term (current) use of aspirin: Secondary | ICD-10-CM | POA: Diagnosis not present

## 2020-04-23 DIAGNOSIS — N903 Dysplasia of vulva, unspecified: Secondary | ICD-10-CM | POA: Diagnosis not present

## 2020-04-23 DIAGNOSIS — Z87891 Personal history of nicotine dependence: Secondary | ICD-10-CM | POA: Insufficient documentation

## 2020-04-23 DIAGNOSIS — E039 Hypothyroidism, unspecified: Secondary | ICD-10-CM | POA: Diagnosis not present

## 2020-04-23 DIAGNOSIS — Z9079 Acquired absence of other genital organ(s): Secondary | ICD-10-CM | POA: Diagnosis not present

## 2020-04-23 DIAGNOSIS — Z79899 Other long term (current) drug therapy: Secondary | ICD-10-CM | POA: Diagnosis not present

## 2020-04-23 DIAGNOSIS — Z8541 Personal history of malignant neoplasm of cervix uteri: Secondary | ICD-10-CM | POA: Diagnosis not present

## 2020-04-23 NOTE — Patient Instructions (Signed)
Preparing for your Surgery  Plan for surgery on May 09, 2020 with Dr. Jeral Pinch at [facility]. You will be scheduled for a examination under anesthesia, vulvar biopsies, vulvar laser.   Pre-operative Testing -You will receive a phone call from presurgical testing at [facility] to discuss pre-operative instructions, arrange for labs if needed, and COVID test. The COVID test normally happens 3 days prior to the surgery and they ask that you self quarantine after the test up until surgery to decrease chance of exposure.  -Bring your insurance card, copy of an advanced directive if applicable, medication list  Day Before Surgery at [place] will be advised you can have clear liquids up until 3 hours before your surgery.    Your role in recovery Your role is to become active as soon as directed by your doctor, while still giving yourself time to heal.  Rest when you feel tired. You will be asked to do the following in order to speed your recovery:  - Cough and breathe deeply. This helps to clear and expand your lungs and can prevent pneumonia after surgery.  - [place]. Do mild physical activity. Walking or moving your legs help your circulation and body functions return to normal. Do not try to get up or walk alone the first time after surgery.   -If you develop swelling on one leg or the other, pain in the back of your leg, redness/warmth in one of your legs, please call the office or go to the Emergency Room to have a doppler to rule out a blood clot. For shortness of breath, chest pain-seek care in the Emergency Room as soon as possible. - Actively manage your pain. Managing your pain lets you move in comfort. We will ask you to rate your pain on a scale of zero to 10. It is your responsibility to tell your doctor or nurse where and how much you hurt so your pain can be treated.  Pain Management After Surgery  -Make sure  that you have Tylenol at home to use on a regular basis after surgery for pain control.   Bowel Regimen - It is important to prevent constipation and drink adequate amounts of liquids.   Risks of Surgery Risks of surgery are low but include bleeding, infection, damage to surrounding structures, re-operation, blood clots, and very rarely death.  Post Operative Instructions Following Laser Surgery  Laser treatment of condyloma (warts) is used to vaporize or eliminate the wart.  The laser actually creates a burn effect on the skin to accomplish this.  The following instructions will help in you comfort postoperatively:  First 24 h  Sit in a tub or sitz bath of COOL water for 20 minutes 1-3 times a day.  After the bath, carefully blot the area dry and place Silvadene over vulva.  You may sit on a covered ice pack between the baths as needed or on a circular pillow.     If you become uncomfortable, call the office for instructions.   Take your pain medications as prescribed  You may eat whatever you feel like.  Start with a light meal and gradually advance your diet.    Beginning the day after your surgery  Sit in a tub of cool to warm water for 15 minutes at least 3 times a day and after bowel movements  After the bowel movement, clean and dry the area.  Apply Silvadene  to the vulva after your baths.    Drainage is usually a pink to tan color and is normal for the following 2-3 weeks after surgery. You can use a peripad to help collect any drainage.  Diet  Eat a regular diet.  Avoid foods that may constipate you or give you diarrhea.  Avoid foods with seeds, nuts, corn or popcorn.  Beginning the day after surgery, drink 6-8 glasses of water a day in addition to your meals.  Limit you caffeine intake to 1-2 servings per day.  Medication  Take a fiber supplement (Metamucil, Citrucel, FiberCon) twice a day.  Take a stool softener like Colace twice daily  Take your pain medication  as directed.  You may use Extra Strength Tylenol instead of your prescribed pain medication to relieve mild discomfort.  Avoid products containing aspirin.  Bowel Habits  You should have a bowel movement at least every other day.  If you are constipated, you may take a Fleet enema or 2 Dulcolax tablets.  Call the office if no results occur.  You may bear down a normal amount to have a bowel movement without hurting your tissues after the operation.  Activity  Walking is encouraged.  You may drive when you are no longer on prescription pain medication.  You may go up and down stairs carefully.  No heavy lifting or strenuous activity until after your first post operative appointment  Do NOT sit on a rubber ring; instead use a soft pillow if needed.  Call the office if you have any questions or concerns.  Call IMMEDIATELY if you develop:  Fever greater than 100 F  Difficulty urinating  Reasons to call the Doctor:  Fever - Oral temperature greater than 100.4 degrees Fahrenheit  Foul-smelling vaginal discharge  Difficulty urinating  Nausea and vomiting  Increased pain at the site of the incision that is unrelieved with pain medicine.  Difficulty breathing with or without chest pain  New calf pain especially if only on one side  Sudden, continuing increased vaginal bleeding with or without clots.   Contacts: For questions or concerns you should contact:  Dr. Jeral Pinch at [phone]  Joylene John, NP at [phone]  After Hours: call [phone] and have the GYN Oncologist paged/contacted (after 5 pm or on the weekends).  Messages sent via mychart are for non-urgent matters and are not responded to after hours so for urgent needs, please call the after hours number.

## 2020-05-03 ENCOUNTER — Encounter (HOSPITAL_BASED_OUTPATIENT_CLINIC_OR_DEPARTMENT_OTHER): Payer: Self-pay | Admitting: Gynecologic Oncology

## 2020-05-06 ENCOUNTER — Other Ambulatory Visit: Payer: Self-pay

## 2020-05-06 ENCOUNTER — Other Ambulatory Visit (HOSPITAL_COMMUNITY)
Admission: RE | Admit: 2020-05-06 | Discharge: 2020-05-06 | Disposition: A | Discharge status: Home / Self Care | Payer: Medicare Other | Source: Ambulatory Visit | Attending: Gynecologic Oncology | Admitting: Gynecologic Oncology

## 2020-05-06 ENCOUNTER — Encounter (HOSPITAL_BASED_OUTPATIENT_CLINIC_OR_DEPARTMENT_OTHER): Payer: Self-pay | Admitting: Gynecologic Oncology

## 2020-05-06 ENCOUNTER — Encounter (HOSPITAL_COMMUNITY)
Admission: RE | Admit: 2020-05-06 | Discharge: 2020-05-06 | Disposition: A | Discharge status: Home / Self Care | Payer: Medicare Other | Source: Ambulatory Visit | Attending: Gynecologic Oncology | Admitting: Gynecologic Oncology

## 2020-05-06 DIAGNOSIS — Z20822 Contact with and (suspected) exposure to covid-19: Secondary | ICD-10-CM | POA: Insufficient documentation

## 2020-05-06 DIAGNOSIS — I444 Left anterior fascicular block: Secondary | ICD-10-CM | POA: Insufficient documentation

## 2020-05-06 DIAGNOSIS — Z01818 Encounter for other preprocedural examination: Secondary | ICD-10-CM | POA: Insufficient documentation

## 2020-05-06 LAB — CBC
HCT: 45 % (ref 36.0–46.0)
Hemoglobin: 14.5 g/dL (ref 12.0–15.0)
MCH: 29.6 pg (ref 26.0–34.0)
MCHC: 32.2 g/dL (ref 30.0–36.0)
MCV: 91.8 fL (ref 80.0–100.0)
Platelets: 169 10*3/uL (ref 150–400)
RBC: 4.9 MIL/uL (ref 3.87–5.11)
RDW: 13.8 % (ref 11.5–15.5)
WBC: 5 10*3/uL (ref 4.0–10.5)
nRBC: 0 % (ref 0.0–0.2)

## 2020-05-06 LAB — BASIC METABOLIC PANEL
Anion gap: 11 (ref 5–15)
BUN: 14 mg/dL (ref 8–23)
CO2: 26 mmol/L (ref 22–32)
Calcium: 10.1 mg/dL (ref 8.9–10.3)
Chloride: 103 mmol/L (ref 98–111)
Creatinine, Ser: 0.84 mg/dL (ref 0.44–1.00)
GFR, Estimated: >60 mL/min (ref >60)
Glucose, Bld: 119 mg/dL — ABNORMAL HIGH (ref 70–99)
Potassium: 4.1 mmol/L (ref 3.5–5.1)
Sodium: 140 mmol/L (ref 135–145)

## 2020-05-06 LAB — SARS CORONAVIRUS 2 (TAT 6-24 HRS): SARS Coronavirus 2: NEGATIVE

## 2020-05-06 NOTE — Progress Notes (Signed)
Spoke w/ via phone for pre-op interview---PT Lab needs dos----  NONE             Lab results------cbc bmp, ekg done 05-06-2020 epic COVID test -----05-06-2020 1100- Arrive at -------1015 am 05-09-2020 NPO after MN NO Solid Food.  Clear liquids from MN until---915 am then npo Medications to take morning of surgery ----levothyroxine, oxycarbazenpine, albuterol inhaler prn and bring inhaler- Diabetic medication -----n/a Patient Special Instructions -----none Pre-Op special Istructions -----none Patient verbalized understanding of instructions that were given at this phone interview. Patient denies shortness of breath, chest pain, fever, cough at this phone interview.

## 2020-05-08 ENCOUNTER — Telehealth: Payer: Self-pay

## 2020-05-08 NOTE — Telephone Encounter (Signed)
Reviewed pre-op instructions with [name] as noted in notes by Zelphia Cairo on 05-06-20. Pt verbalized understanding.  No questions or concerns noted.

## 2020-05-09 ENCOUNTER — Ambulatory Visit (HOSPITAL_BASED_OUTPATIENT_CLINIC_OR_DEPARTMENT_OTHER): Payer: Medicare Other | Admitting: Certified Registered Nurse Anesthetist

## 2020-05-09 ENCOUNTER — Ambulatory Visit (HOSPITAL_BASED_OUTPATIENT_CLINIC_OR_DEPARTMENT_OTHER)
Admission: RE | Admit: 2020-05-09 | Discharge: 2020-05-09 | Disposition: A | Discharge status: Home / Self Care | Payer: Medicare Other | Attending: Gynecologic Oncology | Admitting: Gynecologic Oncology

## 2020-05-09 ENCOUNTER — Encounter (HOSPITAL_BASED_OUTPATIENT_CLINIC_OR_DEPARTMENT_OTHER)
Admission: RE | Disposition: A | Discharge status: Home / Self Care | Payer: Self-pay | Source: Home / Self Care | Attending: Gynecologic Oncology

## 2020-05-09 ENCOUNTER — Encounter (HOSPITAL_BASED_OUTPATIENT_CLINIC_OR_DEPARTMENT_OTHER): Payer: Self-pay | Admitting: Gynecologic Oncology

## 2020-05-09 DIAGNOSIS — N901 Moderate vulvar dysplasia: Secondary | ICD-10-CM | POA: Diagnosis not present

## 2020-05-09 DIAGNOSIS — Z79899 Other long term (current) drug therapy: Secondary | ICD-10-CM | POA: Insufficient documentation

## 2020-05-09 DIAGNOSIS — C519 Malignant neoplasm of vulva, unspecified: Secondary | ICD-10-CM

## 2020-05-09 DIAGNOSIS — Z8544 Personal history of malignant neoplasm of other female genital organs: Secondary | ICD-10-CM | POA: Insufficient documentation

## 2020-05-09 DIAGNOSIS — Z8541 Personal history of malignant neoplasm of cervix uteri: Secondary | ICD-10-CM | POA: Insufficient documentation

## 2020-05-09 DIAGNOSIS — Z7982 Long term (current) use of aspirin: Secondary | ICD-10-CM | POA: Insufficient documentation

## 2020-05-09 DIAGNOSIS — L988 Other specified disorders of the skin and subcutaneous tissue: Secondary | ICD-10-CM | POA: Diagnosis present

## 2020-05-09 DIAGNOSIS — N9089 Other specified noninflammatory disorders of vulva and perineum: Secondary | ICD-10-CM

## 2020-05-09 DIAGNOSIS — Z79890 Hormone replacement therapy (postmenopausal): Secondary | ICD-10-CM | POA: Insufficient documentation

## 2020-05-09 DIAGNOSIS — Z90710 Acquired absence of both cervix and uterus: Secondary | ICD-10-CM | POA: Insufficient documentation

## 2020-05-09 DIAGNOSIS — Z87891 Personal history of nicotine dependence: Secondary | ICD-10-CM | POA: Insufficient documentation

## 2020-05-09 DIAGNOSIS — Z808 Family history of malignant neoplasm of other organs or systems: Secondary | ICD-10-CM | POA: Insufficient documentation

## 2020-05-09 HISTORY — PX: CO2 LASER APPLICATION: SHX5778

## 2020-05-09 HISTORY — PX: VULVA /PERINEUM BIOPSY: SHX319

## 2020-05-09 SURGERY — EXAM UNDER ANESTHESIA
Anesthesia: General | Site: Vulva

## 2020-05-09 MED ORDER — SILVER SULFADIAZINE 1 % EX CREA
TOPICAL_CREAM | CUTANEOUS | Status: DC | PRN
  Administered 2020-05-09: 1 via TOPICAL

## 2020-05-09 MED ORDER — ONDANSETRON HCL 4 MG/2ML IJ SOLN
INTRAMUSCULAR | Status: DC | PRN
  Administered 2020-05-09: 4 mg via INTRAVENOUS

## 2020-05-09 MED ORDER — BUPIVACAINE HCL 0.25 % IJ SOLN
INTRAMUSCULAR | Status: DC | PRN
  Administered 2020-05-09: 20 mL

## 2020-05-09 MED ORDER — DIPHENHYDRAMINE HCL 50 MG/ML IJ SOLN
INTRAMUSCULAR | Status: AC
  Filled 2020-05-09: qty 1

## 2020-05-09 MED ORDER — ACETAMINOPHEN 500 MG PO TABS
ORAL_TABLET | ORAL | Status: AC
  Filled 2020-05-09: qty 2

## 2020-05-09 MED ORDER — ACETAMINOPHEN 500 MG PO TABS
1000.0000 mg | ORAL_TABLET | ORAL | Status: AC
  Administered 2020-05-09: 1000 mg via ORAL

## 2020-05-09 MED ORDER — APREPITANT 40 MG PO CAPS
40.0000 mg | ORAL_CAPSULE | Freq: Once | ORAL | Status: AC
  Administered 2020-05-09: 40 mg via ORAL

## 2020-05-09 MED ORDER — FENTANYL CITRATE (PF) 100 MCG/2ML IJ SOLN
INTRAMUSCULAR | Status: DC | PRN
  Administered 2020-05-09: 50 ug via INTRAVENOUS

## 2020-05-09 MED ORDER — TRAMADOL HCL 50 MG PO TABS: 50.0000 mg | ORAL_TABLET | Freq: Four times a day (QID) | ORAL | 0 refills | Status: DC | PRN

## 2020-05-09 MED ORDER — LIDOCAINE 2% (20 MG/ML) 5 ML SYRINGE
INTRAMUSCULAR | Status: DC | PRN
  Administered 2020-05-09: 70 mg via INTRAVENOUS

## 2020-05-09 MED ORDER — AMISULPRIDE (ANTIEMETIC) 5 MG/2ML IV SOLN: 10.0000 mg | Freq: Once | INTRAVENOUS | Status: DC

## 2020-05-09 MED ORDER — IODINE STRONG (LUGOLS) 5 % PO SOLN
ORAL | Status: DC | PRN
  Administered 2020-05-09: 0.1 mL

## 2020-05-09 MED ORDER — ONDANSETRON HCL 4 MG/2ML IJ SOLN
INTRAMUSCULAR | Status: AC
  Filled 2020-05-09: qty 2

## 2020-05-09 MED ORDER — LACTATED RINGERS IV SOLN: INTRAVENOUS | Status: DC

## 2020-05-09 MED ORDER — APREPITANT 40 MG PO CAPS
ORAL_CAPSULE | ORAL | Status: AC
  Filled 2020-05-09: qty 1

## 2020-05-09 MED ORDER — ORAL CARE MOUTH RINSE: 15.0000 mL | Freq: Once | OROMUCOSAL | Status: DC

## 2020-05-09 MED ORDER — PROPOFOL 10 MG/ML IV BOLUS
INTRAVENOUS | Status: DC | PRN
  Administered 2020-05-09: 110 mg via INTRAVENOUS

## 2020-05-09 MED ORDER — FENTANYL CITRATE (PF) 100 MCG/2ML IJ SOLN: 25.0000 ug | INTRAMUSCULAR | Status: DC | PRN

## 2020-05-09 MED ORDER — DEXAMETHASONE SODIUM PHOSPHATE 10 MG/ML IJ SOLN
INTRAMUSCULAR | Status: AC
  Filled 2020-05-09: qty 1

## 2020-05-09 MED ORDER — CHLORHEXIDINE GLUCONATE 0.12 % MT SOLN: 15.0000 mL | Freq: Once | OROMUCOSAL | Status: DC

## 2020-05-09 MED ORDER — FENTANYL CITRATE (PF) 100 MCG/2ML IJ SOLN
INTRAMUSCULAR | Status: AC
  Filled 2020-05-09: qty 2

## 2020-05-09 MED ORDER — EPHEDRINE 5 MG/ML INJ
INTRAVENOUS | Status: AC
  Filled 2020-05-09: qty 10

## 2020-05-09 MED ORDER — ONDANSETRON HCL 4 MG/2ML IJ SOLN: 4.0000 mg | Freq: Once | INTRAMUSCULAR | Status: DC | PRN

## 2020-05-09 MED ORDER — EPHEDRINE SULFATE-NACL 50-0.9 MG/10ML-% IV SOSY
PREFILLED_SYRINGE | INTRAVENOUS | Status: DC | PRN
  Administered 2020-05-09 (×2): 10 mg via INTRAVENOUS

## 2020-05-09 MED ORDER — DEXAMETHASONE SODIUM PHOSPHATE 10 MG/ML IJ SOLN
INTRAMUSCULAR | Status: DC | PRN
  Administered 2020-05-09: 10 mg via INTRAVENOUS

## 2020-05-09 MED ORDER — PROPOFOL 10 MG/ML IV BOLUS
INTRAVENOUS | Status: AC
  Filled 2020-05-09: qty 20

## 2020-05-09 MED ORDER — DIPHENHYDRAMINE HCL 50 MG/ML IJ SOLN
INTRAMUSCULAR | Status: DC | PRN
  Administered 2020-05-09: 12.5 mg via INTRAVENOUS

## 2020-05-09 SURGICAL SUPPLY — 46 items
BLADE CLIPPER SENSICLIP SURGIC (BLADE) IMPLANT
BLADE SURG 11 STRL SS (BLADE) IMPLANT
BLADE SURG 15 STRL LF DISP TIS (BLADE) ×1 IMPLANT
BLADE SURG 15 STRL SS (BLADE) ×2
CANISTER SUCT 1200ML W/VALVE (MISCELLANEOUS) IMPLANT
CANISTER SUCT 3000ML PPV (MISCELLANEOUS) ×1 IMPLANT
CATH ROBINSON RED A/P 16FR (CATHETERS) ×1 IMPLANT
CATH SILICONE 14FRX5CC (CATHETERS) ×1 IMPLANT
COVER WAND RF STERILE (DRAPES) ×2 IMPLANT
DEPRESSOR TONGUE BLADE STERILE (MISCELLANEOUS) ×2 IMPLANT
DRSG TELFA 3X8 NADH (GAUZE/BANDAGES/DRESSINGS) IMPLANT
ELECT BALL LEEP 3MM BLK (ELECTRODE) IMPLANT
GAUZE 4X4 16PLY RFD (DISPOSABLE) ×2 IMPLANT
GLOVE SURG ENC MOIS LTX SZ6 (GLOVE) ×4 IMPLANT
GOWN STRL REUS W/TWL LRG LVL3 (GOWN DISPOSABLE) ×4 IMPLANT
KIT TURNOVER CYSTO (KITS) ×2 IMPLANT
NDL HYPO 25X1 1.5 SAFETY (NEEDLE) ×1 IMPLANT
NEEDLE HYPO 22GX1.5 SAFETY (NEEDLE) ×2 IMPLANT
NEEDLE HYPO 25X1 1.5 SAFETY (NEEDLE) ×2 IMPLANT
NS IRRIG 1000ML POUR BTL (IV SOLUTION) ×1 IMPLANT
NS IRRIG 500ML POUR BTL (IV SOLUTION) ×1 IMPLANT
PACK PERINEAL COLD (PAD) ×2 IMPLANT
PACK VAGINAL WOMENS (CUSTOM PROCEDURE TRAY) ×2 IMPLANT
PAD DRESSING TELFA 3X8 NADH (GAUZE/BANDAGES/DRESSINGS) IMPLANT
PAD PREP 24X48 CUFFED NSTRL (MISCELLANEOUS) ×2 IMPLANT
PUNCH BIOPSY DERMAL 3 (INSTRUMENTS) IMPLANT
PUNCH BIOPSY DERMAL 3MM (INSTRUMENTS) ×2
PUNCH BIOPSY DERMAL 4MM (INSTRUMENTS) IMPLANT
SUT VIC AB 0 CT1 36 (SUTURE) IMPLANT
SUT VIC AB 0 SH 27 (SUTURE) ×1 IMPLANT
SUT VIC AB 2-0 CT2 27 (SUTURE) IMPLANT
SUT VIC AB 2-0 SH 27 (SUTURE)
SUT VIC AB 2-0 SH 27XBRD (SUTURE) IMPLANT
SUT VIC AB 3-0 PS2 18 (SUTURE)
SUT VIC AB 3-0 PS2 18XBRD (SUTURE) IMPLANT
SUT VIC AB 3-0 SH 27 (SUTURE)
SUT VIC AB 3-0 SH 27X BRD (SUTURE) ×1 IMPLANT
SUT VIC AB 4-0 PS2 18 (SUTURE) ×1 IMPLANT
SUT VICRYL 0 UR6 27IN ABS (SUTURE) IMPLANT
SWAB OB GYN 8IN STERILE 2PK (MISCELLANEOUS) ×2 IMPLANT
SYR BULB IRRIG 60ML STRL (SYRINGE) ×2 IMPLANT
TOWEL OR 17X26 10 PK STRL BLUE (TOWEL DISPOSABLE) ×3 IMPLANT
TUBE CONNECTING 12X1/4 (SUCTIONS) ×1 IMPLANT
VACUUM HOSE 7/8X10 W/ WAND (MISCELLANEOUS) ×1 IMPLANT
VACUUM HOSE/TUBING 7/8INX6FT (MISCELLANEOUS) ×1 IMPLANT
WATER STERILE IRR 500ML POUR (IV SOLUTION) IMPLANT

## 2020-05-09 NOTE — Anesthesia Procedure Notes (Signed)
Procedure Name: LMA Insertion Date/Time: 05/09/2020 12:30 PM Performed by: Rogers Blocker, CRNA Pre-anesthesia Checklist: Patient identified, Emergency Drugs available, Suction available and Patient being monitored Patient Re-evaluated:Patient Re-evaluated prior to induction Oxygen Delivery Method: Circle System Utilized Preoxygenation: Pre-oxygenation with 100% oxygen Induction Type: IV induction Ventilation: Mask ventilation without difficulty LMA: LMA inserted LMA Size: 4.0 Number of attempts: 1 Placement Confirmation: positive ETCO2 Tube secured with: Tape Dental Injury: Teeth and Oropharynx as per pre-operative assessment

## 2020-05-09 NOTE — Transfer of Care (Signed)
Immediate Anesthesia Transfer of Care Note  Patient: [name]  Procedure(s) Performed: Jasmine December UNDER ANESTHESIA (N/A Vulva) VULVAR BIOPSY (N/A Vulva) VULVAR  LASER APPLICATION (N/A Vulva)  Patient Location: PACU  Anesthesia Type:General  Level of Consciousness: drowsy, patient cooperative and responds to stimulation  Airway & Oxygen Therapy: Patient Spontanous Breathing  Post-op Assessment: Report given to RN and Post -op Vital signs reviewed and stable  Post vital signs: Reviewed and stable  Last Vitals:  Vitals Value Taken Time  BP 127/68 05/09/20 1305  Temp 36.3 C 05/09/20 1304  Pulse 69 05/09/20 1308  Resp 15 05/09/20 1308  SpO2 97 % 05/09/20 1308  Vitals shown include unvalidated device data.  Last Pain:  Vitals:   05/09/20 1028  TempSrc: Oral  PainSc: 0-No pain      Patients Stated Pain Goal: 5 (49/35/52 1747)  Complications: No complications documented.

## 2020-05-09 NOTE — Anesthesia Postprocedure Evaluation (Signed)
Anesthesia Post Note  Patient: [name]  Procedure(s) Performed: EXAM UNDER ANESTHESIA (N/A Vulva) VULVAR BIOPSY (N/A Vulva) VULVAR  LASER APPLICATION (N/A Vulva)     Patient location during evaluation: PACU Anesthesia Type: General Level of consciousness: awake and alert and oriented Pain management: pain level controlled Vital Signs Assessment: post-procedure vital signs reviewed and stable Respiratory status: spontaneous breathing, nonlabored ventilation and respiratory function stable Cardiovascular status: blood pressure returned to baseline and stable Postop Assessment: no apparent nausea or vomiting Anesthetic complications: no   No complications documented.  Last Vitals:  Vitals:   05/09/20 1304 05/09/20 1315  BP: 127/68 117/60  Pulse: 72 70  Resp: 14 12  Temp: (!) 36.3 C   SpO2: 97% 95%    Last Pain:  Vitals:   05/09/20 1330  TempSrc:   PainSc: 0-No pain                 Jeannene Tschetter A.

## 2020-05-09 NOTE — Anesthesia Preprocedure Evaluation (Signed)
Anesthesia Evaluation  Patient identified by MRN, date of birth, ID band Patient awake    Reviewed: Allergy & Precautions, NPO status , Patient's Chart, lab work & pertinent test results  History of Anesthesia Complications (+) PONV and history of anesthetic complications  Airway Mallampati: II  TM Distance: >3 FB Neck ROM: Full    Dental no notable dental hx. (+) Teeth Intact   Pulmonary asthma , former smoker,    Pulmonary exam normal breath sounds clear to auscultation       Cardiovascular hypertension, Pt. on medications Normal cardiovascular exam Rhythm:Regular Rate:Normal  Hx/o celiac artery aneurysm- stable   Neuro/Psych negative neurological ROS  negative psych ROS   GI/Hepatic negative GI ROS, Neg liver ROS,   Endo/Other  Hypothyroidism Hyperlipidemia  Renal/GU negative Renal ROS   Hx/o vulvar Ca Vulvar lesion    Musculoskeletal negative musculoskeletal ROS (+)   Abdominal   Peds  Hematology negative hematology ROS (+)   Anesthesia Other Findings   Reproductive/Obstetrics                             Anesthesia Physical Anesthesia Plan  ASA: II  Anesthesia Plan: General   Post-op Pain Management:    Induction: Intravenous  PONV Risk Score and Plan: 4 or greater and Treatment may vary due to age or medical condition, Ondansetron, Aprepitant and Amisulpride  Airway Management Planned: LMA  Additional Equipment:   Intra-op Plan:   Post-operative Plan: Extubation in OR  Informed Consent: I have reviewed the patients History and Physical, chart, labs and discussed the procedure including the risks, benefits and alternatives for the proposed anesthesia with the patient or authorized representative who has indicated his/her understanding and acceptance.     Dental advisory given  Plan Discussed with: Anesthesiologist and CRNA  Anesthesia Plan Comments:          Anesthesia Quick Evaluation

## 2020-05-09 NOTE — Interval H&P Note (Signed)
History and Physical Interval Note:  05/09/2020 11:18 AM  [name]  has presented today for surgery, with the diagnosis of VULVAR LESION, HISTORY OF VULVAR CANCER.  The various methods of treatment have been discussed with the patient and family. After consideration of risks, benefits and other options for treatment, the patient has consented to  Procedure(s): EXAM UNDER ANESTHESIA, VULVAR BIOPSY (N/A) VULVAR BIOPSY, (N/A) VULVAR  LASER APPLICATION (N/A) as a surgical intervention.  The patient's history has been reviewed, patient examined, no change in status, stable for surgery.  I have reviewed the patient's chart and labs.  Questions were answered to the patient's satisfaction.     Lafonda Mosses

## 2020-05-09 NOTE — Discharge Instructions (Signed)
Post Operative Instructions Following Laser Surgery  Use the silvadene cream daily after a Sitz bath or shower until cream is gone. Use estrogen 2-3 times a week at night. You can use the lidocaine gel as needed for pain.  Laser was used to treat several areas on your vulva after biopsies were taken.  The laser actually creates a burn effect on the skin to accomplish this.  The following instructions will help in you comfort postoperatively:  First 24 h  Sit in a tub or sitz bath of COOL water for 20 minutes 1-3 times a day.  After the bath, carefully blot the area dry and place Silvadene over vulva.  You may sit on a covered ice pack between the baths as needed or on a circular pillow.     If you become uncomfortable, call the office for instructions.   Take your pain medications as prescribed  You may eat whatever you feel like.  Start with a light meal and gradually advance your diet.    Beginning the day after your surgery  Sit in a tub of cool to warm water for 15 minutes at least 3 times a day and after bowel movements  After the bowel movement, clean and dry the area.  Apply Silvadene to the vulva after your baths.    Drainage is usually a pink to tan color and is normal for the following 2-3 weeks after surgery. You can use a peripad to help collect any drainage.  Diet  Eat a regular diet.  Avoid foods that may constipate you or give you diarrhea.  Avoid foods with seeds, nuts, corn or popcorn.  Beginning the day after surgery, drink 6-8 glasses of water a day in addition to your meals.  Limit you caffeine intake to 1-2 servings per day.  Medication  Take a fiber supplement (Metamucil, Citrucel, FiberCon) twice a day.  Take a stool softener like Colace twice daily  Take your pain medication as directed.  You may use Extra Strength Tylenol instead of your prescribed pain medication to relieve mild discomfort.  Avoid products containing aspirin.  Bowel Habits  You  should have a bowel movement at least every other day.  If you are constipated, you may take a Fleet enema or 2 Dulcolax tablets.  Call the office if no results occur.  You may bear down a normal amount to have a bowel movement without hurting your tissues after the operation.  Activity  Walking is encouraged.  You may drive when you are no longer on prescription pain medication.  You may go up and down stairs carefully.  No heavy lifting or strenuous activity until after your first post operative appointment  Do NOT sit on a rubber ring; instead use a soft pillow if needed.  Call the office if you have any questions or concerns.  Call IMMEDIATELY if you develop:  Fever greater than 100 F  Difficulty urinating    Post Anesthesia Home Care Instructions  Activity: Get plenty of rest for the remainder of the day. A responsible individual must stay with you for 24 hours following the procedure.  For the next 24 hours, DO NOT: -Drive a car -Paediatric nurse -Drink alcoholic beverages -Take any medication unless instructed by your physician -Make any legal decisions or sign important papers.  Meals: Start with liquid foods such as gelatin or soup. Progress to regular foods as tolerated. Avoid greasy, spicy, heavy foods. If nausea and/or vomiting occur, drink only clear liquids  until the nausea and/or vomiting subsides. Call your physician if vomiting continues.  Special Instructions/Symptoms: Your throat may feel dry or sore from the anesthesia or the breathing tube placed in your throat during surgery. If this causes discomfort, gargle with warm salt water. The discomfort should disappear within 24 hours.  If you had a scopolamine patch placed behind your ear for the management of post- operative nausea and/or vomiting:  1. The medication in the patch is effective for 72 hours, after which it should be removed.  Wrap patch in a tissue and discard in the trash. Wash hands thoroughly  with soap and water. 2. You may remove the patch earlier than 72 hours if you experience unpleasant side effects which may include dry mouth, dizziness or visual disturbances. 3. Avoid touching the patch. Wash your hands with soap and water after contact with the patch.

## 2020-05-09 NOTE — Op Note (Signed)
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 Desquamation of the clitoral hood and well as a 1x11.5cm area on the left upper vulva. Atrophic tissue with agglutination at the anterior labia, atrophic tissue at posterior introitus. No significant areas of decreased uptake after application of Lugols (increased uptake of desquamative lesions compared to surround radiated tissue). No findings concerning for malignancy.  Procedure: The patient was identified in the preoperative holding area. Informed consent was signed on the chart. Patient was seen history was reviewed and exam was performed.   The patient was then taken to the operating room and placed in the supine position with SCD hose on. General anesthesia was then induced without difficulty. She was then placed in the dorsolithotomy position. The perineum was prepped with Betadine. The vagina was prepped with Betadine. The patient was then draped after the prep was dried. A red rubber catheter was inserted into the bladder under sterile conditions to drain the bladder then removed.  Timeout was performed the patient, procedure, antibiotic, allergy, and length of procedure. Speculum exam was performed with no significant findings. Agglutinated tissue was gently lysed with blunt dissection at the anterior labia.   Lugols was applied to the perineum. The vulvar tissues were inspected with  findings noted above. Multiple biopsies were taken using 84mm bunch biopsy and Tishler forceps. The subcuticular tissues were infiltrated with 1% lidocaine.   The patient's surgical field was draped with wet towels. The staff and patient ensured laser-safe eyewear and masks were fitted. The laser was set to 8 watts continuous. The laser was tested for accuracy on a tongue depressor.  The laser was applied to the desquamative areas on the vulva that had been previously identified. The tissue was ablated to the desired depth and the eschar was removed with a moistened sponge. When the entire lesions had been ablated the procedure was complete.  Silvadine cream was applied to the laser site.  All instrument, suture, laparotomy, Ray-Tec, and needle counts were correct x2. The patient tolerated the procedure well and was taken recovery room in stable condition.   Jeral Pinch MD Gynecologic Oncology

## 2020-05-10 ENCOUNTER — Telehealth: Payer: Self-pay

## 2020-05-10 ENCOUNTER — Encounter (HOSPITAL_BASED_OUTPATIENT_CLINIC_OR_DEPARTMENT_OTHER): Payer: Self-pay | Admitting: Gynecologic Oncology

## 2020-05-10 NOTE — Telephone Encounter (Signed)
Ms Bickham states that she is eating,drinking, and urinating well. Passing gas. She took MOM this am. No BM yet.  The senokot-s does not work for her. Afebrile. Pain controled with Tylenol. Using frozen peat to surgical area as directed Pt aware of post op appointments as well as the office number [phone] and after hours number [phone] to call if she has any questions or concerns

## 2020-05-13 LAB — SURGICAL PATHOLOGY

## 2020-05-15 ENCOUNTER — Telehealth: Payer: Self-pay | Admitting: Gynecologic Oncology

## 2020-05-15 NOTE — Telephone Encounter (Signed)
Doing well after surgery. Discussed biopsy results. Will discuss further including treatment plans at her visit in April. She is still using the Silvadene and will restart the estrogen once this is finished.  Jeral Pinch MD Gynecologic Oncology

## 2020-06-20 ENCOUNTER — Encounter: Payer: Self-pay | Admitting: Gynecologic Oncology

## 2020-06-20 ENCOUNTER — Inpatient Hospital Stay: Payer: Medicare Other | Attending: Gynecologic Oncology | Admitting: Gynecologic Oncology

## 2020-06-20 ENCOUNTER — Other Ambulatory Visit: Payer: Self-pay

## 2020-06-20 VITALS — BP 125/76 | HR 78 | Temp 96.8°F | Resp 20 | Wt 160.4 lb

## 2020-06-20 DIAGNOSIS — N9089 Other specified noninflammatory disorders of vulva and perineum: Secondary | ICD-10-CM

## 2020-06-20 DIAGNOSIS — Z9889 Other specified postprocedural states: Secondary | ICD-10-CM

## 2020-06-20 DIAGNOSIS — N903 Dysplasia of vulva, unspecified: Secondary | ICD-10-CM

## 2020-06-20 NOTE — Progress Notes (Signed)
Gynecologic Oncology Return Clinic Visit  06/20/2020  Reason for Visit: Postoperative follow-up after vulvar biopsies and laser ablation  Treatment History: She had a palpablevulvarlump in the winter 2019 and 2020. It became more noticeable in April 2020 and she attempted to have an appointment with a gynecologist however she could not get an appointment due to the the coronavirus shot downs. She was eventually able to see a physician on August 25, 2018 at which time evaluation of the vulva confirmed a vulvar mass. 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  Remote history of cervical cancer diagnosed in 2008. 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 Marti [name].   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 09/27/18:Dr Gehrigperformed aradical anterior vulvectomy. 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 The resection margins were negative for carcinoma with the closest being the lateral margin 1:00 at 0.8 cm. The carcinoma invaded 1.3 cm depth. There was no lymphovascular or perineural invasion identified.  Postoperatively a PET/CT was obtained on October 06, 2018. This revealed postoperative changes to the vulva. There was no hypermetabolic local regional lymphadenopathy. There is a questionable subtle focus of hypermetabolism in the left lobe of the liver, indeterminate for liver metastases. MRI abdomen could be performed for further work-up. No other potential findings of hypermetabolic distant metastatic disease were seen.  On 10/25/2018, the patient underwent bilateral superficial inguinal lymphadenectomy with fibroadipose tissue noted on the left and no carcinoma seen in 2 of 2 lymph nodes on the right.  The patient was seen in November with vulvar irritation that was intermittent in nature. 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 She was started on triamcinolone twice a day until her symptoms improved and then transition to daily. I saw her at the end of December and she felt some improvement in her vulvar tenderness but still had significant erythema and irritation of the vulva. I asked her to add Desitin or similar barrier cream. When I saw her again in February, she had no improvement and had had significant irritation with the Desitin. We stopped all topical treatmentaltogether.  ON 3/25, given persistent skin changes and no improvement in symptoms, vulvar biopsied was performed. Biopsy - high grade dysplasia.  06/15/19: partial simple left vulvectomy, partial simple peri-clitoral vulvectomy, vulvar biopsies, extensive CO2 laser ablation of the vulva. VIN3 noted in two vulvectomy specimens, positive margins of left specimen. Biopsies showed VIN1.  Treatment with vaginal/vulvar estrogen since surgery.  05/09/2020: Patient taken to the operating room for vulvar biopsies, lysis of vaginal agglutination, laser ablation of the vulva.  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  Interval History: Patient reports doing well since surgery.  She had significant discomfort and burning with urination immediately after surgery, this has improved and is almost back to baseline.  She used the Silvadene cream for several weeks.  She denies any vaginal bleeding or discharge.  She reports regular bowel and bladder function.  Past Medical/Surgical History: Past Medical History:  Diagnosis Date  . Asthma    mild  . 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 Complication of anesthesia    slow to wake up /  body temperature goes down   warm blankets with 1st surgery only  . Hyperlipidemia   . Hypertension   . Hypothyroidism   . PONV (postoperative nausea and  vomiting)    NEEDS NAUSEA MED BEFORE SURGERY  . 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 ABDOMINAL HYSTERECTOMY  2008   Pelvic lymphadenectomy  . BREAST SURGERY     left breast cyst in areolar area 46 years ago  . CO2 LASER APPLICATION N/A 07/10/6642   Procedure: C02 LASER OF VULVAR, VULVAR BIOPSIES,  SIMPLE VULVECTOMY;  Surgeon: Anne Mosses, MD;  Location: [facility];  Service: Gynecology;  Laterality: N/A;  . CO2 LASER APPLICATION N/A 0/05/4740   Procedure: VULVAR  LASER APPLICATION;  Surgeon: Anne Mosses, MD;  Location: [facility];  Service: Gynecology;  Laterality: N/A;  . LYMPH NODE DISSECTION Bilateral 10/25/2018   Procedure: LYMPH NODE DISSECTION,BILATERAL INGUINOFEMORAL LYMPHADECTOMY;  Surgeon: Anne Marus, MD;  Location: [facility];  Service: Gynecology;  Laterality: Bilateral;  . RADICAL VULVECTOMY N/A 09/27/2018   Procedure: RADICAL VULVECTOMY;  Surgeon: Anne Marus, MD;  Location: [facility];  Service: Gynecology;  Laterality: N/A;  . [place]     with 2008 surgery, later removed  . VULVA [name] BIOPSY N/A 05/09/2020   Procedure: VULVAR BIOPSY;  Surgeon: Anne Mosses, MD;  Location: [facility];  Service: Gynecology;  Laterality: N/A;    Family History  Problem Relation Age of Onset  . Skin cancer Father     Social History   Socioeconomic History  . Marital status: Widowed    Spouse name: Not on file  . Number of children: Not on file  . Years of education: Not on file  . Highest education level: Not on file  Occupational History  . Not on file  Tobacco Use  . Smoking status: Former Smoker    Types: Cigarettes    Quit date: 12/08/2003    Years since quitting: 16.5  . Smokeless tobacco: Never Used  . Tobacco comment: social  Vaping Use  . Vaping Use: Never used  Substance and Sexual Activity  . Alcohol use: No  . Drug use: No  . Sexual activity: Not Currently  Other Topics Concern   . Not on file  Social History Narrative  . Not on file   Social Determinants of Health   Financial Resource Strain: Not on file  Food Insecurity: Not on file  Transportation Needs: Not on file  Physical Activity: Not on file  Stress: Not on file  Social Connections: Not on file    Current Medications:  Current Outpatient Medications:  .  aspirin 81 MG chewable tablet, Chew 81 mg by mouth daily., Disp: , Rfl:  .  cholecalciferol (VITAMIN D3) 25 MCG (1000 UT) tablet, Take 2,000 Units by mouth every evening., Disp: , Rfl:  .  conjugated estrogens (PREMARIN) vaginal cream, Apply dime size amount to the vulva three times a week, Disp: 42.5 g, Rfl: 1 .  hydrochlorothiazide (HYDRODIURIL) 25 MG tablet, Take 12.5 mg by mouth daily. , Disp: , Rfl:  .  levothyroxine (SYNTHROID) 75 MCG tablet, Take 75 mcg by mouth daily before breakfast., Disp: , Rfl:  .  Omega-3 Fatty Acids (FISH OIL) 1200 MG CAPS, Take 6,000 mg by mouth every evening. , Disp: , Rfl:  .  rosuvastatin (CRESTOR) 5 MG tablet, Take 5 mg by mouth at bedtime., Disp: , Rfl:  .  albuterol (VENTOLIN HFA) 108 (90 Base) MCG/ACT inhaler, Inhale 2 puffs into the lungs every 6 (six) hours as needed  for wheezing or shortness of breath. (Patient not taking: No sig reported), Disp: , Rfl:  .  lidocaine (XYLOCAINE) 5 % ointment, APPLY TO AFFECTED AREA 3 TIMES A DAY AS NEEDED (TO VULVA), Disp: 1 g, Rfl: 0 .  Oxcarbazepine (TRILEPTAL) 300 MG tablet, Take 150 mg by mouth. (Patient not taking: No sig reported), Disp: , Rfl:  .  senna-docusate (SENOKOT-S) 8.6-50 MG tablet, Take 2 tablets by mouth at bedtime. Do not take if having diarrhea, you can stop when on normal bowel routine, Disp: 30 tablet, Rfl: 1 .  traMADol (ULTRAM) 50 MG tablet, Take 1 tablet (50 mg total) by mouth every 6 (six) hours as needed for up to 5 doses. (Patient not taking: No sig reported), Disp: 5 tablet, Rfl: 0  Review of Systems: Denies appetite changes, fevers, chills,  fatigue, unexplained weight changes. Denies hearing loss, neck lumps or masses, mouth sores, ringing in ears or voice changes. Denies cough or wheezing.  Denies shortness of breath. Denies chest pain or palpitations. Denies leg swelling. Denies abdominal distention, pain, blood in stools, constipation, diarrhea, nausea, vomiting, or early satiety. Denies pain with intercourse, frequency, hematuria or incontinence. Denies hot flashes, pelvic pain, vaginal bleeding or vaginal discharge.   Denies joint pain, back pain or muscle pain/cramps. Denies itching, rash, or wounds. Denies dizziness, headaches, numbness or seizures. Denies swollen lymph nodes or glands, denies easy bruising or bleeding. Denies anxiety, depression, confusion, or decreased concentration.  Physical Exam: BP 125/76 (BP Location: Right Arm, Patient Position: Sitting)   Pulse 78   Temp (!) 96.8 F (36 C) (Tympanic)   Resp 20   Wt 160 lb 6 oz (72.7 kg)   SpO2 97%   BMI 26.69 kg/m  General: Alert, oriented, no acute distress. HEENT: Atraumatic, normocephalic, sclera anicteric. Chest: Unlabored breathing on room air.  GU: Inner aspects of the labia overall healing well after surgery.  There is been agglutination of the anterior aspect of the vagina again.  The tissue just surrounding the vaginal introitus still has a desquamative appearance and is tender to palpation.  No discrete lesions noted, no discharge or bleeding.  Laboratory & Radiologic Studies: None new  Assessment & Plan: [name] is a 80 y.o. 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  Patient is overall healing well from surgery, although she has some areas with persistent desquamation that are difficult to tell if they are still healing from her laser treatment versus  areas of dysplasia.  We talked about other possible treatments including topical treatment.  Given multifocal nature of her dysplasia, I think it would be difficult for her to use imiquimod or another topical treatment that comes with some side effects to the tissue we are using it on.  I had like her to use vaginal estrogen regularly for 6 weeks and then I will see her back in clinic to reassess.  It may be that every so often we must go to the operating room for an outpatient procedure to take biopsies, excise, or ablate dysplastic areas.  26 minutes of total time was spent for this patient encounter, including preparation, face-to-face counseling with the patient and coordination of care, and documentation of the encounter.  Jeral Pinch, MD  Division of Gynecologic Oncology  Department of Obstetrics and Gynecology  [place] [facility]

## 2020-06-20 NOTE — Patient Instructions (Signed)
I will see you in 6 weeks.  Please use the vaginal estrogen every night for 2 weeks and then every other night until I see you back.

## 2020-07-29 NOTE — Progress Notes (Signed)
Gynecologic Oncology Return Clinic Visit  07/30/20  Reason for Visit: 07/29/20  Treatment History: She had a palpablevulvarlump in the winter 2019 and 2020. It became more noticeable in April 2020 and she attempted to have an appointment with a gynecologist however she could not get an appointment due to the the coronavirus shot downs. She was eventually able to see a physician on August 25, 2018 at which time evaluation of the vulva confirmed a vulvar mass. 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  Remote history of cervical cancer diagnosed in 2008. 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 Marti Sleigh.   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 09/27/18:Dr Gehrigperformed aradical anterior vulvectomy. 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 The resection margins were negative for carcinoma with the closest being the lateral margin 1:00 at 0.8 cm. The carcinoma invaded 1.3 cm depth. There was no lymphovascular or perineural invasion identified.  Postoperatively a PET/CT was obtained on October 06, 2018. This revealed postoperative changes to the vulva. There was no hypermetabolic local regional lymphadenopathy. There is a questionable subtle focus of hypermetabolism in the left lobe of the liver, indeterminate for liver metastases. MRI abdomen could be performed for further work-up. No other potential findings of hypermetabolic distant metastatic disease were seen.  On 10/25/2018, the patient underwent bilateral superficial inguinal lymphadenectomy with fibroadipose tissue noted on the left and no carcinoma seen in 2 of 2 lymph nodes on the right.  The patient was seen in November with vulvar irritation that was intermittent in nature. 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 She was started on triamcinolone twice a day until her symptoms improved and then transition to daily. I saw her at the end of December and she felt some improvement in her vulvar tenderness but still had significant erythema and irritation of the vulva. I asked her to add Desitin or similar barrier cream. When I saw her again in February, she had no improvement and had had significant irritation with the Desitin. We stopped all topical treatmentaltogether.  ON 3/25, given persistent skin changes and no improvement in symptoms, vulvar biopsied was performed. Biopsy - high grade dysplasia.  06/15/19: partial simple left vulvectomy, partial simple peri-clitoral vulvectomy, vulvar biopsies, extensive CO2 laser ablation of the vulva. VIN3 noted in two vulvectomy specimens, positive margins of left specimen. Biopsies showed VIN1.  Treatment with vaginal/vulvar estrogen since surgery.  05/09/2020: Patient taken to the operating room for vulvar biopsies, lysis of vaginal agglutination, laser ablation of the vulva.  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  Interval History: Patient presents for follow-up.  She has been using vaginal estrogen since she saw me last.  She used it initially every night for 2 weeks and has been using it every other night since.  She had 1 short episode of vulvar burning.  Otherwise she denies any burning, pruritus, pain, discharge, or bleeding.  She reports having regular bowel and bladder function and denies any pain related to urination.  She was seen in ED on 5/19 with intractable facial pain in the setting of trigeminal neuralgia.  Past Medical/Surgical History: Past Medical History:  Diagnosis Date  . Asthma    mild  . 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 Complication of anesthesia    slow to wake up /  body temperature goes down   warm blankets with 1st surgery only  . Hyperlipidemia   . Hypertension   .  Hypothyroidism   . PONV (postoperative nausea and vomiting)    NEEDS NAUSEA MED BEFORE SURGERY  . Squamous cell carcinoma of cervix ([place]) 04/2006   Stage IB1    Past Surgical History:  Procedure Laterality Date  . ABDOMINAL HYSTERECTOMY  2008   Pelvic lymphadenectomy  . BREAST SURGERY     left breast cyst in areolar area 46 years ago  . CO2 LASER APPLICATION N/A [id number]   Procedure: C02 LASER OF VULVAR, VULVAR BIOPSIES,  SIMPLE VULVECTOMY;  Surgeon: Lafonda Mosses, MD;  Location: [facility];  Service: Gynecology;  Laterality: N/A;  . CO2 LASER APPLICATION N/A [id number]   Procedure: VULVAR  LASER APPLICATION;  Surgeon: Lafonda Mosses, MD;  [facility];  Service: Gynecology;  Laterality: N/A;  . LYMPH NODE DISSECTION Bilateral 10/25/2018   Procedure: LYMPH NODE DISSECTION,BILATERAL INGUINOFEMORAL LYMPHADECTOMY;  Surgeon: [name], MD;  Location: [facility];  Service: Gynecology;  Laterality: Bilateral;  . RADICAL VULVECTOMY N/A 09/27/2018   Procedure: RADICAL VULVECTOMY;  Surgeon: [name], MD;  Location: [facility];  Service: Gynecology;  Laterality: N/A;  . [place]     with 2008 surgery, later removed  . VULVA Milagros Loll BIOPSY N/A 05/09/2020   Procedure: VULVAR BIOPSY;  Surgeon: Lafonda Mosses, MD;  Location: [facility];  Service: Gynecology;  Laterality: N/A;    Family History  Problem Relation Age of Onset  . Skin cancer Father     Social History   Socioeconomic History  . Marital status: Widowed    Spouse name: Not on file  . Number of children: Not on file  . Years of education: Not on file  . Highest education level: Not on file  Occupational History  . Not on file  Tobacco Use  . Smoking status: Former Smoker    Types: Cigarettes    Quit date: 12/08/2003    Years since quitting: 16.6  . Smokeless tobacco: Never Used  . Tobacco comment: social  Vaping Use  . Vaping Use:  Never used  Substance and Sexual Activity  . Alcohol use: No  . Drug use: No  . Sexual activity: Not Currently  Other Topics Concern  . Not on file  Social History Narrative  . Not on file   Social Determinants of Health   Financial Resource Strain: Not on file  Food Insecurity: Not on file  Transportation Needs: Not on file  Physical Activity: Not on file  Stress: Not on file  Social Connections: Not on file    Current Medications:  Current Outpatient Medications:  .  aspirin 81 MG chewable tablet, Chew 81 mg by mouth daily., Disp: , Rfl:  .  cholecalciferol (VITAMIN D3) 25 MCG (1000 UT) tablet, Take 2,000 Units by mouth every evening., Disp: , Rfl:  .  conjugated estrogens (PREMARIN) vaginal cream, Apply dime size amount to the vulva three times a week, Disp: 42.5 g, Rfl: 1 .  levothyroxine (SYNTHROID) 75 MCG tablet, Take 75 mcg by mouth daily before breakfast., Disp: , Rfl:  .  Omega-3 Fatty Acids (FISH OIL) 1200 MG CAPS, Take 6,000 mg by mouth every evening. , Disp: , Rfl:  .  rosuvastatin (CRESTOR) 5 MG tablet, Take 5 mg by mouth at bedtime., Disp: , Rfl:  .  albuterol (VENTOLIN HFA) 108 (90 Base) MCG/ACT inhaler,  Inhale 2 puffs into the lungs every 6 (six) hours as needed for wheezing or shortness of breath., Disp: , Rfl:  .  hydrochlorothiazide (HYDRODIURIL) 25 MG tablet, Take 12.5 mg by mouth daily.  (Patient not taking: Reported on 07/29/2020), Disp: , Rfl:  .  lidocaine (XYLOCAINE) 5 % ointment, APPLY TO AFFECTED AREA 3 TIMES A DAY AS NEEDED (TO VULVA) (Patient not taking: Reported on 07/29/2020), Disp: 1 g, Rfl: 0 .  Oxcarbazepine (TRILEPTAL) 300 MG tablet, Take 150 mg by mouth. (Patient not taking: No sig reported), Disp: , Rfl:  .  senna-docusate (SENOKOT-S) 8.6-50 MG tablet, Take 2 tablets by mouth at bedtime. Do not take if having diarrhea, you can stop when on normal bowel routine (Patient not taking: Reported on 07/29/2020), Disp: 30 tablet, Rfl: 1 .  traMADol (ULTRAM)  50 MG tablet, Take 1 tablet (50 mg total) by mouth every 6 (six) hours as needed for up to 5 doses., Disp: 5 tablet, Rfl: 0  Review of Systems: Denies appetite changes, fevers, chills, fatigue, unexplained weight changes. Denies hearing loss, neck lumps or masses, mouth sores, ringing in ears or voice changes. Denies cough or wheezing.  Denies shortness of breath. Denies chest pain or palpitations. Denies leg swelling. Denies abdominal distention, pain, blood in stools, constipation, diarrhea, nausea, vomiting, or early satiety. Denies pain with intercourse, dysuria, frequency, hematuria or incontinence. Denies hot flashes, pelvic pain, vaginal bleeding or vaginal discharge.   Denies joint pain, back pain or muscle pain/cramps. Denies itching, rash, or wounds. Denies dizziness, headaches, numbness or seizures. Denies swollen lymph nodes or glands, denies easy bruising or bleeding. Denies anxiety, depression, confusion, or decreased concentration.  Physical Exam: BP 130/74 (BP Location: Left Arm, Patient Position: Sitting)   Pulse 80   Temp 98.2 F (36.8 C) (Oral)   Resp 16   Ht 5\' 5"  (1.651 m)   Wt 144 lb (65.3 kg)   SpO2 96% Comment: ra  BMI 23.96 kg/m  General: Alert, oriented, no acute distress. HEENT: Normocephalic, atraumatic, sclera anicteric. Chest: Unlabored breathing on room air. Extremities: Grossly normal range of motion.  Warm, well perfused.  No edema bilaterally. Lymphatics: No cervical, supraclavicular, or inguinal adenopathy. GU: External vulva significant for some atrophy.  There continue to be several lesions, an approximately 1 cm area of desquamation on the right vulva and an approximately 2 x 2 centimeter area at the apex of the vulva just superior to the urethra.  While they appear desquamative, there is not significant erythema, discharge or exudate.  Patient does not have any pain with palpation over these areas.  There is some agglutination of the vagina, but  one finger can be easily inserted.  No nodularity.  Laboratory & Radiologic Studies: None new  Assessment & Plan: [name] is a 80 y.o. 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  Patient is doing very well from a symptom standpoint.  Today is the first day that I have seen her in some time where she denies any recent symptoms.  She is remembering to use the estrogen cream and seems to be doing well.  Her vulva is similar in appearance to after surgery.  I favor keeping a close eye on these areas of desquamation.  If things seem to appear worse,  then we may biopsy versus plan an outpatient procedure for biopsies and treatment.  We will plan for visits every 3 months.  Patient knows to call me if she develops any symptoms before her next scheduled visit.  26 minutes of total time was spent for this patient encounter, including preparation, face-to-face counseling with the patient and coordination of care, and documentation of the encounter.  Jeral Pinch, MD  Division of Gynecologic Oncology  Department of Obstetrics and Gynecology  [facility]

## 2020-07-30 ENCOUNTER — Encounter: Payer: Self-pay | Admitting: Gynecologic Oncology

## 2020-07-30 ENCOUNTER — Inpatient Hospital Stay: Payer: Medicare Other | Attending: Gynecologic Oncology | Admitting: Gynecologic Oncology

## 2020-07-30 ENCOUNTER — Other Ambulatory Visit: Payer: Self-pay

## 2020-07-30 VITALS — BP 130/74 | HR 80 | Temp 98.2°F | Resp 16 | Ht 65.0 in | Wt 144.0 lb

## 2020-07-30 DIAGNOSIS — Z87412 Personal history of vulvar dysplasia: Secondary | ICD-10-CM | POA: Diagnosis not present

## 2020-07-30 DIAGNOSIS — Z90722 Acquired absence of ovaries, bilateral: Secondary | ICD-10-CM | POA: Insufficient documentation

## 2020-07-30 DIAGNOSIS — Z9079 Acquired absence of other genital organ(s): Secondary | ICD-10-CM | POA: Diagnosis not present

## 2020-07-30 DIAGNOSIS — Z90710 Acquired absence of both cervix and uterus: Secondary | ICD-10-CM | POA: Diagnosis not present

## 2020-07-30 DIAGNOSIS — C519 Malignant neoplasm of vulva, unspecified: Secondary | ICD-10-CM | POA: Diagnosis present

## 2020-07-30 NOTE — Patient Instructions (Signed)
Good to see you today.  Things look fairly stable.  Since you have been doing better from a symptom standpoint, lets plan to continue with the vaginal estrogen at night every other day.  If you develop any new symptoms before your next visit with me in 3 months, please call to see me sooner.

## 2020-07-30 NOTE — Progress Notes (Deleted)
HISTORY AND PHYSICAL     CC:  follow up. Requesting Provider:  Cyndi Bender, PA-C  HPI: This is a 80 y.o. female who is here today for follow up for PAD.  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  This was initially detected in 2012 during a workup for abdominal pain. She had this imaged in 2014 where it measures 1.5 mm.   Pt was last seen 06/12/2019 and at that time, her celiac artery aneurysm was unchanged with maximal diameter of 1.1cm and instructed to f/u in one year.    The pt returns today for follow up.  ***  The pt is on a statin for cholesterol management.    The pt is on an aspirin.    Other AC:  none The pt is on diuretic for hypertension.  The pt does not have diabetes. Tobacco hx:  former  Pt does *** have family hx of AAA.  Past Medical History:  Diagnosis Date  . Asthma    mild  . 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 Complication of anesthesia    slow to wake up /  body temperature goes down   warm blankets with 1st surgery only  . Hyperlipidemia   . Hypertension   . Hypothyroidism   . PONV (postoperative nausea and vomiting)    NEEDS NAUSEA MED BEFORE SURGERY  . 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 ABDOMINAL HYSTERECTOMY  2008   Pelvic lymphadenectomy  . BREAST SURGERY     left breast cyst in areolar area 46 years ago  . CO2 LASER APPLICATION N/A 03/17/2991   Procedure: C02 LASER OF VULVAR, VULVAR BIOPSIES,  SIMPLE VULVECTOMY;  Surgeon: Lafonda Mosses, MD;  Location: [facility];  Service: Gynecology;  Laterality: N/A;  . CO2 LASER APPLICATION N/A 09/06/6965   Procedure: VULVAR  LASER APPLICATION;  Surgeon: Lafonda Mosses, MD;  Location: [facility];  Service: Gynecology;  Laterality: N/A;  . LYMPH NODE DISSECTION Bilateral 10/25/2018   Procedure: LYMPH NODE  DISSECTION,BILATERAL INGUINOFEMORAL LYMPHADECTOMY;  Surgeon: [name], MD;  Location: [facility];  Service: Gynecology;  Laterality: Bilateral;  . RADICAL VULVECTOMY N/A 09/27/2018   Procedure: RADICAL VULVECTOMY;  Surgeon: [name], MD;  Location: [facility];  Service: Gynecology;  Laterality: N/A;  . [place]     with 2008 surgery, later removed  . VULVA Milagros Loll BIOPSY N/A 05/09/2020   Procedure: VULVAR BIOPSY;  Surgeon: Lafonda Mosses, MD;  Location: [facility];  Service: Gynecology;  Laterality: N/A;    Allergies  Allergen Reactions  . Prednisone Other (See Comments)    Feels Dizzy  . Codeine Anxiety    "Jumpy inside"  . Latex Rash    Current Outpatient Medications  Medication Sig Dispense Refill  . albuterol (VENTOLIN HFA) 108 (90 Base) MCG/ACT inhaler Inhale 2 puffs into the lungs every 6 (six) hours as needed for wheezing or shortness of breath.    Marland Kitchen aspirin 81 MG chewable tablet Chew 81 mg by mouth daily.    . cholecalciferol (VITAMIN D3) 25 MCG (1000 UT) tablet Take 2,000 Units by mouth every evening.    . conjugated estrogens (PREMARIN) vaginal cream Apply dime size amount to the vulva three times a week 42.5 g 1  . hydrochlorothiazide (HYDRODIURIL) 25 MG  tablet Take 12.5 mg by mouth daily.  (Patient not taking: Reported on 07/29/2020)    . levothyroxine (SYNTHROID) 75 MCG tablet Take 75 mcg by mouth daily before breakfast.    . lidocaine (XYLOCAINE) 5 % ointment APPLY TO AFFECTED AREA 3 TIMES A DAY AS NEEDED (TO VULVA) (Patient not taking: Reported on 07/29/2020) 1 g 0  . Omega-3 Fatty Acids (FISH OIL) 1200 MG CAPS Take 6,000 mg by mouth every evening.     . Oxcarbazepine (TRILEPTAL) 300 MG tablet Take 150 mg by mouth. (Patient not taking: No sig reported)    . rosuvastatin (CRESTOR) 5 MG tablet Take 5 mg by mouth at bedtime.    . senna-docusate (SENOKOT-S) 8.6-50 MG tablet Take 2 tablets by mouth at bedtime. Do not take if having  diarrhea, you can stop when on normal bowel routine (Patient not taking: Reported on 07/29/2020) 30 tablet 1  . traMADol (ULTRAM) 50 MG tablet Take 1 tablet (50 mg total) by mouth every 6 (six) hours as needed for up to 5 doses. 5 tablet 0   No current facility-administered medications for this visit.    Family History  Problem Relation Age of Onset  . Skin cancer Father     Social History   Socioeconomic History  . Marital status: Widowed    Spouse name: Not on file  . Number of children: Not on file  . Years of education: Not on file  . Highest education level: Not on file  Occupational History  . Not on file  Tobacco Use  . Smoking status: Former Smoker    Types: Cigarettes    Quit date: 12/08/2003    Years since quitting: 16.6  . Smokeless tobacco: Never Used  . Tobacco comment: social  Vaping Use  . Vaping Use: Never used  Substance and Sexual Activity  . Alcohol use: No  . Drug use: No  . Sexual activity: Not Currently  Other Topics Concern  . Not on file  Social History Narrative  . Not on file   Social Determinants of Health   Financial Resource Strain: Not on file  Food Insecurity: Not on file  Transportation Needs: Not on file  Physical Activity: Not on file  Stress: Not on file  Social Connections: Not on file  Intimate Partner Violence: Not on file     REVIEW OF SYSTEMS:  *** [X]  denotes positive finding, [ ]  denotes negative finding Cardiac  Comments:  Chest pain or chest pressure:    Shortness of breath upon exertion:    Short of breath when lying flat:    Irregular heart rhythm:        Vascular    Pain in calf, thigh, or hip brought on by ambulation:    Pain in feet at night that wakes you up from your sleep:     Blood clot in your veins:    Leg swelling:         Pulmonary    Oxygen at home:    Productive cough:     Wheezing:         Neurologic    Sudden weakness in arms or legs:     Sudden numbness in arms or legs:     Sudden  onset of difficulty speaking or slurred speech:    Temporary loss of vision in one eye:     Problems with dizziness:         Gastrointestinal    Blood in stool:  Vomited blood:         Genitourinary    Burning when urinating:     Blood in urine:        Psychiatric    Major depression:         Hematologic    Bleeding problems:    Problems with blood clotting too easily:        Skin    Rashes or ulcers:        Constitutional    Fever or chills:      PHYSICAL EXAMINATION:  ***  General:  WDWN in NAD; vital signs documented above Gait: Not observed HENT: WNL, normocephalic Pulmonary: normal non-labored breathing , without wheezing Cardiac: {Desc; regular/irreg:14544} HR, without  Murmur; {With/Without:20273} carotid bruit*** Abdomen: soft, NT, no masses; aortic pulse is *** palpable Skin: {With/Without:20273} rashes Vascular Exam/Pulses:  Right Left  Radial {Exam; arterial pulse strength 0-4:30167} {Exam; arterial pulse strength 0-4:30167}  Ulnar {Exam; arterial pulse strength 0-4:30167} {Exam; arterial pulse strength 0-4:30167}  Femoral {Exam; arterial pulse strength 0-4:30167} {Exam; arterial pulse strength 0-4:30167}  Popliteal {Exam; arterial pulse strength 0-4:30167} {Exam; arterial pulse strength 0-4:30167}  DP {Exam; arterial pulse strength 0-4:30167} {Exam; arterial pulse strength 0-4:30167}  PT {Exam; arterial pulse strength 0-4:30167} {Exam; arterial pulse strength 0-4:30167}   Extremities: {With/Without:20273} ischemic changes, {With/Without:20273} Gangrene , {With/Without:20273} cellulitis; {With/Without:20273} open wounds;  Musculoskeletal: no muscle wasting or atrophy  Neurologic: A&O X 3;  No focal weakness or paresthesias are detected Psychiatric:  The pt has {Desc; normal/abnormal:11317::"Normal"} affect.   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 Overall, the size and configuration remains unchanged dating to 2012, if not slightly smaller, with near complete circumferential calcification of the small saccular outpouching inferior to the common hepatic artery.   ASSESSMENT/PLAN:: 80 y.o. female here for follow up for celiac artery aneurysm that is followed by Dr. Trula Slade  -*** -pt will f/u in *** with ***.   Leontine Locket, [facility] [phone]  Clinic MD:   Scot Dock

## 2020-08-01 ENCOUNTER — Ambulatory Visit (HOSPITAL_COMMUNITY): Admission: RE | Admit: 2020-08-01 | Payer: Medicare Other | Source: Ambulatory Visit

## 2020-08-01 ENCOUNTER — Ambulatory Visit: Payer: Medicare Other

## 2020-08-02 ENCOUNTER — Ambulatory Visit: Payer: Medicare Other | Admitting: Gynecologic Oncology

## 2020-08-08 HISTORY — PX: RETROMASTOID CRANIOTOMY: SHX2341

## 2020-09-10 ENCOUNTER — Other Ambulatory Visit: Payer: Self-pay | Admitting: Gynecologic Oncology

## 2020-09-10 DIAGNOSIS — N900 Mild vulvar dysplasia: Secondary | ICD-10-CM

## 2020-09-10 NOTE — Telephone Encounter (Signed)
Received phone call from [name] requesting a refill on her premarin cream. Patient states I will not have enough to last me until my next appointment with Dr. Berline Lopes.

## 2020-10-21 ENCOUNTER — Other Ambulatory Visit: Payer: Self-pay

## 2020-10-21 ENCOUNTER — Inpatient Hospital Stay: Payer: Medicare Other | Attending: Gynecologic Oncology | Admitting: Gynecologic Oncology

## 2020-10-21 ENCOUNTER — Encounter: Payer: Self-pay | Admitting: Gynecologic Oncology

## 2020-10-21 VITALS — BP 114/59 | HR 92 | Temp 96.8°F | Resp 18 | Ht 66.0 in | Wt 153.2 lb

## 2020-10-21 DIAGNOSIS — Z9079 Acquired absence of other genital organ(s): Secondary | ICD-10-CM | POA: Insufficient documentation

## 2020-10-21 DIAGNOSIS — N904 Leukoplakia of vulva: Secondary | ICD-10-CM | POA: Diagnosis not present

## 2020-10-21 DIAGNOSIS — N9089 Other specified noninflammatory disorders of vulva and perineum: Secondary | ICD-10-CM

## 2020-10-21 DIAGNOSIS — I10 Essential (primary) hypertension: Secondary | ICD-10-CM | POA: Diagnosis not present

## 2020-10-21 DIAGNOSIS — E785 Hyperlipidemia, unspecified: Secondary | ICD-10-CM | POA: Diagnosis not present

## 2020-10-21 DIAGNOSIS — C519 Malignant neoplasm of vulva, unspecified: Secondary | ICD-10-CM

## 2020-10-21 DIAGNOSIS — Z7982 Long term (current) use of aspirin: Secondary | ICD-10-CM | POA: Insufficient documentation

## 2020-10-21 DIAGNOSIS — Z79899 Other long term (current) drug therapy: Secondary | ICD-10-CM | POA: Insufficient documentation

## 2020-10-21 DIAGNOSIS — N900 Mild vulvar dysplasia: Secondary | ICD-10-CM | POA: Diagnosis not present

## 2020-10-21 DIAGNOSIS — E039 Hypothyroidism, unspecified: Secondary | ICD-10-CM | POA: Insufficient documentation

## 2020-10-21 MED ORDER — PREMARIN 0.625 MG/GM VA CREA: TOPICAL_CREAM | VAGINAL | 3 refills | Status: DC

## 2020-10-21 NOTE — Patient Instructions (Signed)
Continue using the estrogen cream every other night. Dr. Berline Lopes will plan on seeing you in the office in three months for follow up or sooner if needed. Please call the office for any new symptoms, worsening symptoms, or for any questions/concerns at [phone].

## 2020-10-21 NOTE — Progress Notes (Signed)
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  It became more noticeable in April 2020 and she attempted to have an appointment with a gynecologist however she could not get an appointment due to the the coronavirus shot downs.  She was eventually able to see a physician on August 25, 2018 at which time evaluation of the vulva confirmed a vulvar mass.  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 Marti Sleigh.    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 09/27/18: Dr Alycia Rossetti performed a radical anterior vulvectomy.  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  The resection margins were negative for carcinoma with the closest being the lateral margin 1:00 at 0.8 cm.  The carcinoma invaded 1.3 cm depth.  There was no lymphovascular or perineural invasion identified.   Postoperatively a PET/CT was obtained on October 06, 2018.  This revealed postoperative changes to the vulva.  There was no hypermetabolic local regional lymphadenopathy.  There is a questionable subtle focus of hypermetabolism in the left lobe of the liver, indeterminate for liver metastases.  MRI abdomen could be performed for further work-up.  No other potential findings of hypermetabolic distant metastatic disease were seen.   On 10/25/2018, the patient underwent bilateral superficial inguinal lymphadenectomy with fibroadipose tissue noted on the left and no carcinoma seen in 2 of 2 lymph nodes on the right.   The patient was seen in November with vulvar irritation that was intermittent in nature.   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  She was started on triamcinolone twice a day until her symptoms improved and then transition to daily.  I saw her at the end of December and she felt some improvement in her vulvar tenderness but still had significant erythema and irritation of the vulva.  I asked her to add Desitin or similar barrier cream.  When I saw her again in February, she had no improvement and had had significant irritation with the Desitin.  We stopped all topical treatment altogether.   ON 3/25, given persistent skin changes and no improvement in symptoms, vulvar biopsied was performed. Biopsy - high grade dysplasia.   06/15/19: partial simple left vulvectomy, partial simple peri-clitoral vulvectomy, vulvar biopsies, extensive CO2 laser ablation of the vulva. VIN3 noted in two vulvectomy specimens, positive margins of left specimen. Biopsies showed VIN1.    Treatment with vaginal/vulvar estrogen since surgery.   05/09/2020: Patient taken to the operating room for vulvar biopsies, lysis of vaginal agglutination, laser ablation of the vulva.  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  Interval History: The patient presents today for follow-up.  She notes overall doing well.  Since I saw her last, she had surgery for her trigeminal neuralgia.  She has some residual numbness but overall feels much better than she did the last time I saw her.  From a vulvar standpoint, she notes occasional vulvar irritation or mild pain on average 2-3 times a week that is self-limited.  Otherwise she denies any symptoms including discharge or bleeding.  She had to stop the vaginal estrogen for  several days around the time of her surgery but otherwise is continuing to use it every other night.  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    Past Surgical History:  Procedure Laterality Date   ABDOMINAL HYSTERECTOMY  2008   Pelvic lymphadenectomy   BREAST SURGERY     left breast cyst in areolar area 46 years ago   CO2 LASER APPLICATION N/A [id number]   Procedure: C02 LASER OF VULVAR, VULVAR BIOPSIES,  SIMPLE VULVECTOMY;  Surgeon: Lafonda Mosses, MD;  Location: [place];  Service: Gynecology;  Laterality: N/A;   CO2 LASER APPLICATION N/A [id number]   Procedure: VULVAR  LASER APPLICATION;  Surgeon: Lafonda Mosses, MD;  Location: [facility];  Service: Gynecology;  Laterality: N/A;   LYMPH NODE DISSECTION Bilateral 10/25/2018   Procedure: LYMPH NODE DISSECTION,BILATERAL INGUINOFEMORAL LYMPHADECTOMY;  Surgeon: [name], MD;  Location: [facility];  Service: Gynecology;  Laterality: Bilateral;   RADICAL VULVECTOMY N/A 09/27/2018   Procedure: RADICAL VULVECTOMY;  Surgeon: [name], MD;  Location: [facility];  Service: Gynecology;  Laterality: N/A;   SUPRAPUBIC CATHETER PLACEMENT     with 2008 surgery, later removed   VULVA /PERINEUM BIOPSY N/A 05/09/2020   Procedure: VULVAR BIOPSY;  Surgeon: Lafonda Mosses, MD;  [facility];  Service: Gynecology;  Laterality: N/A;    Family History  Problem Relation Age of Onset   Skin cancer Father     Social History   Socioeconomic History   Marital status: Widowed    Spouse name: Not on file   Number of children: Not on file   Years of education: Not on file   Highest education level: Not on file  Occupational History   Not on file  Tobacco Use   Smoking status: Former    Types: Cigarettes    Quit date:  12/08/2003    Years since quitting: 16.8   Smokeless tobacco: Never   Tobacco comments:    social  Vaping Use   Vaping Use: Never used  Substance and Sexual Activity   Alcohol use: No   Drug use: No   Sexual activity: Not Currently  Other Topics Concern   Not on file  Social History Narrative   Not on file   Social Determinants of Health   Financial Resource Strain: Not on file  Food Insecurity: Not on file  Transportation Needs: Not on file  Physical Activity: Not on file  Stress: Not on file  Social Connections: Not on file    Current Medications:  Current Outpatient Medications:    albuterol (VENTOLIN HFA) 108 (90 Base) MCG/ACT inhaler, Inhale 2 puffs into the lungs every 6 (six) hours as needed for wheezing or shortness of breath., Disp: , Rfl:    aspirin 81 MG chewable tablet, Chew 81 mg by mouth daily., Disp: , Rfl:    cholecalciferol (VITAMIN D3) 25 MCG (1000 UT) tablet, Take 2,000 Units by mouth  every evening., Disp: , Rfl:    conjugated estrogens (PREMARIN) vaginal cream, APPLY DIME SIZE AMOUNT TO THE VULVA EVERY OTHER NIGHT, Disp: 30 g, Rfl: 3   hydrochlorothiazide (HYDRODIURIL) 25 MG tablet, Take 12.5 mg by mouth daily.  (Patient not taking: Reported on 07/29/2020), Disp: , Rfl:    levothyroxine (SYNTHROID) 75 MCG tablet, Take 75 mcg by mouth daily before breakfast., Disp: , Rfl:    lidocaine (XYLOCAINE) 5 % ointment, APPLY TO AFFECTED AREA 3 TIMES A DAY AS NEEDED (TO VULVA) (Patient not taking: Reported on 07/29/2020), Disp: 1 g, Rfl: 0   Omega-3 Fatty Acids (FISH OIL) 1200 MG CAPS, Take 6,000 mg by mouth every evening. , Disp: , Rfl:    Oxcarbazepine (TRILEPTAL) 300 MG tablet, Take 150 mg by mouth. (Patient not taking: No sig reported), Disp: , Rfl:    rosuvastatin (CRESTOR) 5 MG tablet, Take 5 mg by mouth at bedtime., Disp: , Rfl:    senna-docusate (SENOKOT-S) 8.6-50 MG tablet, Take 2 tablets by mouth at bedtime. Do not take if having diarrhea, you can stop when on  normal bowel routine (Patient not taking: Reported on 07/29/2020), Disp: 30 tablet, Rfl: 1   traMADol (ULTRAM) 50 MG tablet, Take 1 tablet (50 mg total) by mouth every 6 (six) hours as needed for up to 5 doses., Disp: 5 tablet, Rfl: 0  Review of Systems: Denies appetite changes, fevers, chills, fatigue, unexplained weight changes. Denies neck lumps or masses, mouth sores, ringing in ears or voice changes. Denies cough or wheezing.  Denies shortness of breath. Denies chest pain or palpitations. Denies leg swelling. Denies abdominal distention, pain, blood in stools, constipation, diarrhea, nausea, vomiting, or early satiety. Denies pain with intercourse, dysuria, frequency, hematuria or incontinence. Denies hot flashes, vaginal bleeding or vaginal discharge.   Denies joint pain, back pain or muscle pain/cramps. Denies dizziness, headaches, or seizures. Denies swollen lymph nodes or glands, denies easy bruising or bleeding. Denies anxiety, depression, confusion, or decreased concentration.  Physical Exam: BP (!) 114/59 (BP Location: Right Arm, Patient Position: Sitting)   Pulse 92   Temp (!) 96.8 F (36 C) (Tympanic)   Resp 18   Ht '5\' 6"'$  (1.676 m)   Wt 153 lb 4 oz (69.5 kg)   SpO2 98%   BMI 24.74 kg/m  General: Alert, oriented, no acute distress. HEENT: Normocephalic, atraumatic, sclera anicteric. Chest: Unlabored breathing on room air. Extremities: Grossly normal range of motion.  Warm, well perfused.  No edema bilaterally. Skin: No rashes or lesions noted. Lymphatics: No cervical, supraclavicular, or inguinal adenopathy. GU: External female genitalia notable for significant atrophy and loss of architecture.  There are multiple areas of desquamation, slightly larger in size than her last visit in May.  There is no erythema or exudate.  Patient does not have any tenderness with palpation over these areas.  There continues to be some vaginal agglutination but 1 digit exam notes no  tenderness and no nodularity or masses.  Laboratory & Radiologic Studies: None new  Assessment & Plan: Liam Bahn is a 80 y.o. 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   Patient continues to do well from a symptom standpoint.  She thinks that her symptoms are stable since her last visit.  I would say she is endorsing a little  bit more frequent symptoms but overall symptoms are well controlled.  She continues to have areas of desquamation on much of her vulva, somewhat increased from her last visit.  Depending on her exam in 3 months, we may proceed for an exam under anesthesia, biopsies, and laser ablation versus excision of desquamative areas subsequently.  She was amenable to this.  She will call me if her symptoms worsen or she develops any new symptoms between now and her next visit.  32 minutes of total time was spent for this patient encounter, including preparation, face-to-face counseling with the patient and coordination of care, and documentation of the encounter.  Jeral Pinch, MD  Division of Gynecologic Oncology  Department of Obstetrics and Gynecology  [facility]

## 2020-10-23 ENCOUNTER — Ambulatory Visit: Payer: Medicare Other | Admitting: Gynecologic Oncology

## 2020-12-05 ENCOUNTER — Other Ambulatory Visit (HOSPITAL_COMMUNITY): Payer: Self-pay | Admitting: Vascular Surgery

## 2020-12-05 ENCOUNTER — Ambulatory Visit (HOSPITAL_COMMUNITY)
Admission: RE | Admit: 2020-12-05 | Discharge: 2020-12-05 | Disposition: A | Discharge status: Home / Self Care | Payer: Medicare Other | Source: Ambulatory Visit | Attending: Vascular Surgery | Admitting: Vascular Surgery

## 2020-12-05 ENCOUNTER — Other Ambulatory Visit: Payer: Self-pay

## 2020-12-05 ENCOUNTER — Ambulatory Visit: Payer: Medicare Other | Admitting: Physician Assistant

## 2020-12-05 VITALS — BP 136/84 | HR 84 | Temp 97.8°F | Resp 14 | Ht 66.0 in | Wt 155.0 lb

## 2020-12-05 DIAGNOSIS — I728 Aneurysm of other specified arteries: Secondary | ICD-10-CM

## 2020-12-05 NOTE — Progress Notes (Signed)
Office Note     CC:  follow up Requesting Provider:  Cyndi Bender, PA-C  HPI: [name] is a 80 y.o. (10-Jun-1940) female who presents for surveillance of celiac artery aneurysm.  This was initially found incidentally during work-up for abdominal pain.  She denies any new or changing abdominal or back pain.  She also denies any food fear or unexplained weight loss.  She is seen regularly by her PCP for management of chronic medical conditions including hypertension, hyperlipidemia, and prediabetes managed with diet.  She is a former smoker.    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    Past Surgical History:  Procedure Laterality Date   ABDOMINAL HYSTERECTOMY  2008   Pelvic lymphadenectomy   BREAST SURGERY     left breast cyst in areolar area 46 years ago   CO2 LASER APPLICATION N/A 10/10/1515   Procedure: C02 LASER OF VULVAR, VULVAR BIOPSIES,  SIMPLE VULVECTOMY;  Surgeon: Lafonda Mosses, MD;  Location: [facility];  Service: Gynecology;  Laterality: N/A;   CO2 LASER APPLICATION N/A 08/08/6071   Procedure: VULVAR  LASER APPLICATION;  Surgeon: Lafonda Mosses, MD;  Location: [facility];  Service: Gynecology;  Laterality: N/A;   LYMPH NODE DISSECTION Bilateral 10/25/2018   Procedure: LYMPH NODE DISSECTION,BILATERAL INGUINOFEMORAL LYMPHADECTOMY;  Surgeon: [name], MD;  Location: [facility];  Service: Gynecology;  Laterality: Bilateral;   RADICAL VULVECTOMY N/A 09/27/2018   Procedure: RADICAL VULVECTOMY;  Surgeon: [name], MD;  Location: [facility];  Service: Gynecology;  Laterality: N/A;   SUPRAPUBIC CATHETER PLACEMENT     with 2008 surgery, later removed   VULVA /PERINEUM BIOPSY N/A 05/09/2020   Procedure: VULVAR BIOPSY;  Surgeon: Lafonda Mosses, MD;  [facility];  Service: Gynecology;  Laterality: N/A;    Social History   Socioeconomic History   Marital status: Widowed    Spouse name: Not on file   Number of children: Not on file   Years of education: Not on file   Highest education level: Not on file  Occupational History   Not on file  Tobacco Use   Smoking status: Former    Types: Cigarettes    Quit date: 12/08/2003    Years since quitting: 17.0   Smokeless tobacco: Never   Tobacco comments:    social  Vaping Use   Vaping Use: Never used  Substance and Sexual Activity   Alcohol use: No   Drug use: No   Sexual activity: Not Currently  Other Topics Concern   Not on file  Social History Narrative   Not on file   Social Determinants of Health   Financial Resource Strain: Not on file  Food Insecurity: Not on file  Transportation Needs: Not on file  Physical Activity: Not on file  Stress: Not on file  Social Connections: Not on file  Intimate Partner Violence: Not on file    Family History  Problem Relation Age  of Onset   Skin cancer Father     Current Outpatient Medications  Medication Sig Dispense Refill   albuterol (VENTOLIN HFA) 108 (90 Base) MCG/ACT inhaler Inhale 2 puffs into the lungs every 6 (six) hours as needed for wheezing or shortness of breath.     aspirin 81 MG chewable tablet Chew 81 mg by mouth daily.     cholecalciferol (VITAMIN D3) 25 MCG (1000 UT) tablet Take 2,000 Units by mouth every evening.     conjugated estrogens (PREMARIN) vaginal cream APPLY DIME SIZE AMOUNT TO THE VULVA EVERY OTHER NIGHT 30 g 3   levothyroxine (SYNTHROID) 75 MCG tablet Take 75 mcg by mouth daily before breakfast.     Omega-3 Fatty Acids (FISH OIL) 1200 MG CAPS Take 6,000 mg by  mouth every evening.      rosuvastatin (CRESTOR) 5 MG tablet Take 5 mg by mouth at bedtime.     traMADol (ULTRAM) 50 MG tablet Take 1 tablet (50 mg total) by mouth every 6 (six) hours as needed for up to 5 doses. 5 tablet 0   hydrochlorothiazide (HYDRODIURIL) 25 MG tablet Take 12.5 mg by mouth daily.  (Patient not taking: No sig reported)     lidocaine (XYLOCAINE) 5 % ointment APPLY TO AFFECTED AREA 3 TIMES A DAY AS NEEDED (TO VULVA) (Patient not taking: No sig reported) 1 g 0   Oxcarbazepine (TRILEPTAL) 300 MG tablet Take 150 mg by mouth. (Patient not taking: No sig reported)     senna-docusate (SENOKOT-S) 8.6-50 MG tablet Take 2 tablets by mouth at bedtime. Do not take if having diarrhea, you can stop when on normal bowel routine (Patient not taking: No sig reported) 30 tablet 1   No current facility-administered medications for this visit.    Allergies  Allergen Reactions   Prednisone Other (See Comments)    Feels Dizzy   Codeine Anxiety    "Jumpy inside"   Latex Rash     REVIEW OF SYSTEMS:   [X]  denotes positive finding, [ ]  denotes negative finding Cardiac  Comments:  Chest pain or chest pressure:    Shortness of breath upon exertion:    Short of breath when lying flat:    Irregular heart rhythm:        Vascular    Pain in calf, thigh, or hip brought on by ambulation:    Pain in feet at night that wakes you up from your sleep:     Blood clot in your veins:    Leg swelling:         Pulmonary    Oxygen at home:    Productive cough:     Wheezing:         Neurologic    Sudden weakness in arms or legs:     Sudden numbness in arms or legs:     Sudden onset of difficulty speaking or slurred speech:    Temporary loss of vision in one eye:     Problems with dizziness:         Gastrointestinal    Blood in stool:     Vomited blood:         Genitourinary    Burning when urinating:     Blood in urine:        Psychiatric    Major depression:         Hematologic     Bleeding problems:    Problems with blood clotting too easily:  Skin    Rashes or ulcers:        Constitutional    Fever or chills:      PHYSICAL EXAMINATION:  Vitals:   12/05/20 0941  BP: 136/84  Pulse: 84  Resp: 14  Temp: 97.8 F (36.6 C)  TempSrc: Temporal  SpO2: 97%  Weight: 155 lb (70.3 kg)  Height: 5\' 6"  (1.676 m)    General:  WDWN in NAD; vital signs documented above Gait: Not observed HENT: WNL, normocephalic Pulmonary: normal non-labored breathing , without Rales, rhonchi,  wheezing Cardiac: regular HR Abdomen: soft, NT, no masses Skin: without rashes Vascular Exam/Pulses:  Right Left  Radial 2+ (normal) 2+ (normal)  DP 1+ (weak) absent  PT absent absent   Extremities: without ischemic changes, without Gangrene , without cellulitis; without open wounds;  Musculoskeletal: no muscle wasting or atrophy  Neurologic: A&O X 3;  No focal weakness or paresthesias are detected Psychiatric:  The pt has Normal affect.   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 female here for follow up for surveillance of celiac artery aneurysm  -Subjectively patient does not have any new or changing abdominal or back pain -Duplex demonstrates 1.2 cm celiac artery aneurysm which is unchanged over the past several years -Continue regular follow-up with PCP for management of chronic medical conditions including hypertension, hyperlipidemia, and prediabetes -Plan will be to recheck mesenteric duplex in 1 year.  If celiac artery aneurysm is not well visualized at that time, we will check CTA the following year   Dagoberto Ligas, PA-C [facility] [phone]  Clinic MD:   Scot Dock

## 2021-01-08 ENCOUNTER — Telehealth: Payer: Self-pay | Admitting: *Deleted

## 2021-01-08 NOTE — Telephone Encounter (Signed)
Spoke with the patient and moved her appt from 11/15 to 11/14

## 2021-01-15 ENCOUNTER — Encounter: Payer: Self-pay | Admitting: Gynecologic Oncology

## 2021-01-20 ENCOUNTER — Inpatient Hospital Stay: Payer: Medicare Other | Admitting: Gynecologic Oncology

## 2021-01-20 ENCOUNTER — Other Ambulatory Visit: Payer: Self-pay | Admitting: Gynecologic Oncology

## 2021-01-20 ENCOUNTER — Telehealth: Payer: Self-pay | Admitting: *Deleted

## 2021-01-20 DIAGNOSIS — C519 Malignant neoplasm of vulva, unspecified: Secondary | ICD-10-CM

## 2021-01-20 DIAGNOSIS — N900 Mild vulvar dysplasia: Secondary | ICD-10-CM

## 2021-01-20 MED ORDER — PREMARIN 0.625 MG/GM VA CREA: TOPICAL_CREAM | VAGINAL | 3 refills | Status: DC

## 2021-01-20 NOTE — Telephone Encounter (Signed)
Patient called and rescheduled her appt from today to 12/12. Patient also request a refill on her Premarin cream; call into [facility] in [place].

## 2021-01-20 NOTE — Progress Notes (Unsigned)
Gynecologic Oncology Return Clinic Visit  01/20/21  Reason for Visit: surveillance in the setting of a history of vulvar cancer now being followed for vulvar dysplasia  Treatment History: She had a palpable vulvar lump in the winter 2019 and 2020.  It became more noticeable in April 2020 and she attempted to have an appointment with a gynecologist however she could not get an appointment due to the the coronavirus shot downs.  She was eventually able to see a physician on August 25, 2018 at which time evaluation of the vulva confirmed a vulvar mass.  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 Marti Sleigh.    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 09/27/18: Dr Alycia Rossetti performed a radical anterior vulvectomy.  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  The resection margins were negative for carcinoma with the closest being the lateral margin 1:00 at 0.8 cm.  The carcinoma invaded 1.3 cm depth.  There was no lymphovascular or perineural invasion identified.   Postoperatively a PET/CT was obtained on October 06, 2018.  This revealed postoperative changes to the vulva.  There was no hypermetabolic local regional lymphadenopathy.  There is a questionable subtle focus of hypermetabolism in the left lobe of the liver, indeterminate for liver metastases.  MRI abdomen could be performed for further work-up.  No other potential findings of hypermetabolic distant metastatic disease were seen.   On 10/25/2018, the patient underwent bilateral superficial inguinal lymphadenectomy with fibroadipose tissue noted on the left and no carcinoma seen in 2 of 2 lymph nodes on the right.   The patient was seen in November with vulvar irritation that was intermittent in  nature.  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  She was started on triamcinolone twice a day until her symptoms improved and then transition to daily.  I saw her at the end of December and she felt some improvement in her vulvar tenderness but still had significant erythema and irritation of the vulva.  I asked her to add Desitin or similar barrier cream.  When I saw her again in February, she had no improvement and had had significant irritation with the Desitin.  We stopped all topical treatment altogether.   ON 3/25, given persistent skin changes and no improvement in symptoms, vulvar biopsied was performed. Biopsy - high grade dysplasia.   06/15/19: partial simple left vulvectomy, partial simple peri-clitoral vulvectomy, vulvar biopsies, extensive CO2 laser ablation of the vulva. VIN3 noted in two vulvectomy specimens, positive margins of left specimen. Biopsies showed VIN1.    Treatment with vaginal/vulvar estrogen since surgery.   05/09/2020: Patient taken to the operating room for vulvar biopsies, lysis of vaginal agglutination, laser ablation of the vulva.  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  Interval History: ***  The patient presents today for follow-up.  She notes overall doing well.  Since I saw her last, she had surgery for her trigeminal neuralgia.  She has some residual numbness but overall feels much better than she did the last time I saw her.   From a vulvar standpoint, she notes occasional vulvar irritation or mild pain on average 2-3 times a week that is self-limited.  Otherwise she denies any symptoms including discharge or bleeding.  She had to stop the  vaginal estrogen for several days around the time of her surgery but otherwise is continuing to use it every other night.  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    Past Surgical History:  Procedure Laterality Date   ABDOMINAL HYSTERECTOMY  2008   Pelvic lymphadenectomy   BREAST SURGERY     left breast cyst in areolar area 46 years ago   CO2 LASER APPLICATION N/A 10/15/3808   Procedure: C02 LASER OF VULVAR, VULVAR BIOPSIES,  SIMPLE VULVECTOMY;  Surgeon: Lafonda Mosses, MD;  Location: [facility];  Service: Gynecology;  Laterality: N/A;   CO2 LASER APPLICATION N/A 03/15/5100   Procedure: VULVAR  LASER APPLICATION;  Surgeon: Lafonda Mosses, MD;  Location: [facility];  Service: Gynecology;  Laterality: N/A;   LYMPH NODE DISSECTION Bilateral 10/25/2018   Procedure: LYMPH NODE DISSECTION,BILATERAL INGUINOFEMORAL LYMPHADECTOMY;  Surgeon: [name], MD;  Location: [facility];  Service: Gynecology;  Laterality: Bilateral;   RADICAL VULVECTOMY N/A 09/27/2018   Procedure: RADICAL VULVECTOMY;  Surgeon: [name], MD;  Location: [facility];  Service: Gynecology;  Laterality: N/A;   SUPRAPUBIC CATHETER PLACEMENT     with 2008 surgery, later removed   VULVA /PERINEUM BIOPSY N/A 05/09/2020   Procedure: VULVAR BIOPSY;  Surgeon: Lafonda Mosses, MD;  Location: [facility];  Service: Gynecology;  Laterality: N/A;    Family History  Problem Relation Age of Onset   Skin cancer Father     Social History   Socioeconomic History   Marital status: Widowed    Spouse name: Not on file   Number of children: Not on file   Years of education: Not on file   Highest education level: Not on file  Occupational History   Not on file  Tobacco Use   Smoking status: Former    Types: Cigarettes     Quit date: 12/08/2003    Years since quitting: 17.1   Smokeless tobacco: Never   Tobacco comments:    social  Vaping Use   Vaping Use: Never used  Substance and Sexual Activity   Alcohol use: No   Drug use: No   Sexual activity: Not Currently  Other Topics Concern   Not on file  Social History Narrative   Not on file   Social Determinants of Health   Financial Resource Strain: Not on file  Food Insecurity: Not on file  Transportation Needs: Not on file  Physical Activity: Not on file  Stress: Not on file  Social Connections: Not on file    Current Medications:  Current Outpatient Medications:    aspirin 81 MG chewable tablet, Chew 81 mg by mouth daily., Disp: , Rfl:    carboxymethylcellulose (REFRESH PLUS) 0.5 % SOLN, Place 1 drop into the right eye 4 times daily., Disp: , Rfl:    cholecalciferol (VITAMIN D3) 25 MCG (1000 UT) tablet, Take 2,000 Units by mouth every evening., Disp: , Rfl:  conjugated estrogens (PREMARIN) vaginal cream, APPLY DIME SIZE AMOUNT TO THE VULVA EVERY OTHER NIGHT, Disp: 30 g, Rfl: 3   hydrochlorothiazide (HYDRODIURIL) 25 MG tablet, Take 12.5 mg by mouth daily., Disp: , Rfl:    levothyroxine (SYNTHROID) 75 MCG tablet, Take 75 mcg by mouth daily before breakfast., Disp: , Rfl:    Omega-3 Fatty Acids (FISH OIL) 1200 MG CAPS, Take 6,000 mg by mouth every evening. , Disp: , Rfl:    rosuvastatin (CRESTOR) 5 MG tablet, Take 5 mg by mouth at bedtime., Disp: , Rfl:    albuterol (VENTOLIN HFA) 108 (90 Base) MCG/ACT inhaler, Inhale 2 puffs into the lungs every 6 (six) hours as needed for wheezing or shortness of breath. (Patient not taking: Reported on 01/15/2021), Disp: , Rfl:    lidocaine (XYLOCAINE) 5 % ointment, APPLY TO AFFECTED AREA 3 TIMES A DAY AS NEEDED (TO VULVA) (Patient not taking: No sig reported), Disp: 1 g, Rfl: 0   Oxcarbazepine (TRILEPTAL) 300 MG tablet, Take 150 mg by mouth. (Patient not taking: No sig reported), Disp: , Rfl:    senna-docusate  (SENOKOT-S) 8.6-50 MG tablet, Take 2 tablets by mouth at bedtime. Do not take if having diarrhea, you can stop when on normal bowel routine (Patient not taking: No sig reported), Disp: 30 tablet, Rfl: 1   traMADol (ULTRAM) 50 MG tablet, Take 1 tablet (50 mg total) by mouth every 6 (six) hours as needed for up to 5 doses. (Patient not taking: Reported on 01/15/2021), Disp: 5 tablet, Rfl: 0  Review of Systems: Denies appetite changes, fevers, chills, fatigue, unexplained weight changes. Denies hearing loss, neck lumps or masses, mouth sores, ringing in ears or voice changes. Denies cough or wheezing.  Denies shortness of breath. Denies chest pain or palpitations. Denies leg swelling. Denies abdominal distention, pain, blood in stools, constipation, diarrhea, nausea, vomiting, or early satiety. Denies pain with intercourse, dysuria, frequency, hematuria or incontinence. Denies hot flashes, pelvic pain, vaginal bleeding or vaginal discharge.   Denies joint pain, back pain or muscle pain/cramps. Denies itching, rash, or wounds. Denies dizziness, headaches, numbness or seizures. Denies swollen lymph nodes or glands, denies easy bruising or bleeding. Denies anxiety, depression, confusion, or decreased concentration.  Physical Exam: There were no vitals taken for this visit. General: ***Alert, oriented, no acute distress. HEENT: ***Posterior oropharynx clear, sclera anicteric. Chest: ***Clear to auscultation bilaterally.  ***Port site clean. Cardiovascular: ***Regular rate and rhythm, no murmurs. Abdomen: ***Obese, soft, nontender.  Normoactive bowel sounds.  No masses or hepatosplenomegaly appreciated.  ***Well-healed scar. Extremities: ***Grossly normal range of motion.  Warm, well perfused.  No edema bilaterally. Skin: ***No rashes or lesions noted. Lymphatics: ***No cervical, supraclavicular, or inguinal adenopathy. GU: Normal appearing external genitalia without erythema, excoriation, or  lesions.  Speculum exam reveals ***.  Bimanual exam reveals ***.  ***Rectovaginal exam  confirms ___.  GU: External female genitalia notable for significant atrophy and loss of architecture.  There are multiple areas of desquamation, slightly larger in size than her last visit in May.  There is no erythema or exudate.  Patient does not have any tenderness with palpation over these areas.  There continues to be some vaginal agglutination but 1 digit exam notes no tenderness and no nodularity or masses.  Laboratory & Radiologic Studies: ***  Assessment & Plan: Mario Voong is a 80 y.o. 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   Patient continues to do well from a symptom standpoint.  She thinks that her symptoms are stable since her last visit.  I would say she is endorsing a little bit more frequent symptoms but overall symptoms are well controlled.  She continues to have areas of desquamation on much of her vulva, somewhat increased from her last visit.  Depending on her exam in 3 months, we may proceed for an exam under anesthesia, biopsies, and laser ablation versus excision of desquamative areas subsequently.  She was amenable to this.  She will call me if her symptoms worsen or she develops any new symptoms between now and her next visit  *** minutes of total time was spent for this patient encounter, including preparation, face-to-face counseling with the patient and coordination of care, and documentation of the encounter.  Jeral Pinch, MD  Division of Gynecologic Oncology  Department of Obstetrics and Gynecology  [facility]

## 2021-01-21 ENCOUNTER — Ambulatory Visit: Payer: Medicare Other | Admitting: Gynecologic Oncology

## 2021-02-17 ENCOUNTER — Inpatient Hospital Stay (HOSPITAL_BASED_OUTPATIENT_CLINIC_OR_DEPARTMENT_OTHER): Payer: Medicare Other | Admitting: Gynecologic Oncology

## 2021-02-17 ENCOUNTER — Encounter: Payer: Self-pay | Admitting: Gynecologic Oncology

## 2021-02-17 ENCOUNTER — Inpatient Hospital Stay: Payer: Medicare Other | Attending: Gynecologic Oncology | Admitting: Gynecologic Oncology

## 2021-02-17 ENCOUNTER — Other Ambulatory Visit: Payer: Self-pay

## 2021-02-17 VITALS — BP 138/78 | HR 90 | Temp 97.9°F | Resp 16 | Ht 66.0 in | Wt 158.8 lb

## 2021-02-17 DIAGNOSIS — N903 Dysplasia of vulva, unspecified: Secondary | ICD-10-CM | POA: Diagnosis not present

## 2021-02-17 DIAGNOSIS — N9089 Other specified noninflammatory disorders of vulva and perineum: Secondary | ICD-10-CM

## 2021-02-17 DIAGNOSIS — Z8541 Personal history of malignant neoplasm of cervix uteri: Secondary | ICD-10-CM | POA: Diagnosis not present

## 2021-02-17 DIAGNOSIS — I10 Essential (primary) hypertension: Secondary | ICD-10-CM | POA: Insufficient documentation

## 2021-02-17 DIAGNOSIS — N9489 Other specified conditions associated with female genital organs and menstrual cycle: Secondary | ICD-10-CM

## 2021-02-17 DIAGNOSIS — Z8544 Personal history of malignant neoplasm of other female genital organs: Secondary | ICD-10-CM | POA: Insufficient documentation

## 2021-02-17 DIAGNOSIS — E039 Hypothyroidism, unspecified: Secondary | ICD-10-CM | POA: Insufficient documentation

## 2021-02-17 DIAGNOSIS — N893 Dysplasia of vagina, unspecified: Secondary | ICD-10-CM | POA: Diagnosis not present

## 2021-02-17 DIAGNOSIS — Z9079 Acquired absence of other genital organ(s): Secondary | ICD-10-CM | POA: Insufficient documentation

## 2021-02-17 DIAGNOSIS — Z79899 Other long term (current) drug therapy: Secondary | ICD-10-CM | POA: Diagnosis not present

## 2021-02-17 DIAGNOSIS — C519 Malignant neoplasm of vulva, unspecified: Secondary | ICD-10-CM

## 2021-02-17 DIAGNOSIS — Z7982 Long term (current) use of aspirin: Secondary | ICD-10-CM | POA: Diagnosis not present

## 2021-02-17 DIAGNOSIS — Z79890 Hormone replacement therapy (postmenopausal): Secondary | ICD-10-CM | POA: Diagnosis not present

## 2021-02-17 DIAGNOSIS — E785 Hyperlipidemia, unspecified: Secondary | ICD-10-CM | POA: Diagnosis not present

## 2021-02-17 DIAGNOSIS — R102 Pelvic and perineal pain: Secondary | ICD-10-CM | POA: Diagnosis not present

## 2021-02-17 DIAGNOSIS — Z87891 Personal history of nicotine dependence: Secondary | ICD-10-CM | POA: Insufficient documentation

## 2021-02-17 DIAGNOSIS — J45909 Unspecified asthma, uncomplicated: Secondary | ICD-10-CM | POA: Insufficient documentation

## 2021-02-17 NOTE — H&P (View-Only) (Signed)
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  It became more noticeable in April 2020 and she attempted to have an appointment with a gynecologist however she could not get an appointment due to the the coronavirus shot downs.  She was eventually able to see a physician on August 25, 2018 at which time evaluation of the vulva confirmed a vulvar mass.  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 Marti Sleigh.    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 09/27/18: Dr Alycia Rossetti performed a radical anterior vulvectomy.  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  The resection margins were negative for carcinoma with the closest being the lateral margin 1:00 at 0.8 cm.  The carcinoma invaded 1.3 cm depth.  There was no lymphovascular or perineural invasion identified.   Postoperatively a PET/CT was obtained on October 06, 2018.  This revealed postoperative changes to the vulva.  There was no hypermetabolic local regional lymphadenopathy.  There is a questionable subtle focus of hypermetabolism in the left lobe of the liver, indeterminate for liver metastases.  MRI abdomen could be performed for further work-up.  No other potential findings of hypermetabolic distant metastatic disease were seen.   On 10/25/2018, the patient underwent bilateral superficial inguinal lymphadenectomy with fibroadipose tissue noted on the left and no carcinoma seen in 2 of 2 lymph nodes on the right.   The patient was seen in November with vulvar irritation that was intermittent in  nature.  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  She was started on triamcinolone twice a day until her symptoms improved and then transition to daily.  I saw her at the end of December and she felt some improvement in her vulvar tenderness but still had significant erythema and irritation of the vulva.  I asked her to add Desitin or similar barrier cream.  When I saw her again in February, she had no improvement and had had significant irritation with the Desitin.  We stopped all topical treatment altogether.   ON 3/25, given persistent skin changes and no improvement in symptoms, vulvar biopsied was performed. Biopsy - high grade dysplasia.   06/15/19: partial simple left vulvectomy, partial simple peri-clitoral vulvectomy, vulvar biopsies, extensive CO2 laser ablation of the vulva. VIN3 noted in two vulvectomy specimens, positive margins of left specimen. Biopsies showed VIN1.    Treatment with vaginal/vulvar estrogen since surgery.   05/09/2020: Patient taken to the operating room for vulvar biopsies, lysis of vaginal agglutination, laser ablation of the vulva.  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  Interval History: Patient seen last in August, she noted occasional vulvar irritation and pain at that time.  There were multiple areas of desquamation on her exam, slightly larger in size than at her previous visit.  Plan at that time was for repeat exam in 3 months.  Depending on findings, may proceed with another exam under anesthesia, biopsies, and possible treatment (either laser or excision).  Today, she notes that vulvar symptoms are unchanged.  She has some burning intermittently, she thinks probably several times a  week.  She very occasionally has pruritus.  She denies any vaginal bleeding, discharge, or pain.  She is using her estrogen cream every other night.  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    Past Surgical History:  Procedure Laterality Date   ABDOMINAL HYSTERECTOMY  2008   Pelvic lymphadenectomy   BREAST SURGERY     left breast cyst in areolar area 46 years ago   CO2 LASER APPLICATION N/A 05/15/1015   Procedure: C02 LASER OF VULVAR, VULVAR BIOPSIES,  SIMPLE VULVECTOMY;  Surgeon: Lafonda Mosses, MD;  Location: [facility];  Service: Gynecology;  Laterality: N/A;   CO2 LASER APPLICATION N/A 07/07/256   Procedure: VULVAR  LASER APPLICATION;  Surgeon: Lafonda Mosses, MD;  Location: [facility];  Service: Gynecology;  Laterality: N/A;   LYMPH NODE DISSECTION Bilateral 10/25/2018   Procedure: LYMPH NODE DISSECTION,BILATERAL INGUINOFEMORAL LYMPHADECTOMY;  Surgeon: [name], MD;  Location: [facility];  Service: Gynecology;  Laterality: Bilateral;   RADICAL VULVECTOMY N/A 09/27/2018   Procedure: RADICAL VULVECTOMY;  Surgeon: [name], MD;  Location: [facility];  Service: Gynecology;  Laterality: N/A;   SUPRAPUBIC CATHETER PLACEMENT     with 2008 surgery, later removed   VULVA /PERINEUM BIOPSY N/A 05/09/2020   Procedure: VULVAR BIOPSY;  Surgeon: Lafonda Mosses, MD;  Location: [facility];  Service: Gynecology;  Laterality: N/A;    Family History  Problem Relation Age of Onset   Skin cancer Father     Social History   Socioeconomic History   Marital status: Widowed    Spouse name: Not on file   Number of children: Not on file   Years of education: Not on file   Highest education level: Not on file  Occupational History   Not on  file  Tobacco Use   Smoking status: Former    Types: Cigarettes    Quit date: 12/08/2003    Years since quitting: 17.2   Smokeless tobacco: Never   Tobacco comments:    social  Vaping Use   Vaping Use: Never used  Substance and Sexual Activity   Alcohol use: No   Drug use: No   Sexual activity: Not Currently  Other Topics Concern   Not on file  Social History Narrative   Not on file   Social Determinants of Health   Financial Resource Strain: Not on file  Food Insecurity: Not on file  Transportation Needs: Not on file  Physical Activity: Not on file  Stress: Not on file  Social Connections: Not on file    Current Medications:  Current Outpatient Medications:    albuterol (VENTOLIN HFA) 108 (90 Base) MCG/ACT inhaler, Inhale 2 puffs into the lungs every 6 (six) hours as needed for wheezing or shortness of breath., Disp: , Rfl:    aspirin 81 MG chewable tablet, Chew 81 mg by mouth daily., Disp: , Rfl:    carboxymethylcellulose (REFRESH PLUS) 0.5 %  SOLN, Place 1 drop into the right eye 4 times daily., Disp: , Rfl:    cholecalciferol (VITAMIN D3) 25 MCG (1000 UT) tablet, Take 2,000 Units by mouth every evening., Disp: , Rfl:    conjugated estrogens (PREMARIN) vaginal cream, APPLY DIME SIZE AMOUNT TO THE VULVA EVERY OTHER NIGHT, Disp: 30 g, Rfl: 3   hydrochlorothiazide (HYDRODIURIL) 25 MG tablet, Take 12.5 mg by mouth daily., Disp: , Rfl:    levothyroxine (SYNTHROID) 75 MCG tablet, Take 75 mcg by mouth daily before breakfast., Disp: , Rfl:    Omega-3 Fatty Acids (FISH OIL) 1200 MG CAPS, Take 6,000 mg by mouth every evening. , Disp: , Rfl:    rosuvastatin (CRESTOR) 5 MG tablet, Take 5 mg by mouth at bedtime., Disp: , Rfl:    lidocaine (XYLOCAINE) 5 % ointment, APPLY TO AFFECTED AREA 3 TIMES A DAY AS NEEDED (TO VULVA) (Patient not taking: Reported on 07/29/2020), Disp: 1 g, Rfl: 0   Oxcarbazepine (TRILEPTAL) 300 MG tablet, Take 150 mg by mouth. (Patient not taking: Reported on  06/19/2020), Disp: , Rfl:    senna-docusate (SENOKOT-S) 8.6-50 MG tablet, Take 2 tablets by mouth at bedtime. Do not take if having diarrhea, you can stop when on normal bowel routine (Patient not taking: Reported on 07/29/2020), Disp: 30 tablet, Rfl: 1   traMADol (ULTRAM) 50 MG tablet, Take 1 tablet (50 mg total) by mouth every 6 (six) hours as needed for up to 5 doses. (Patient not taking: Reported on 01/15/2021), Disp: 5 tablet, Rfl: 0  Review of Systems: Pertinent positives as per HPI Denies appetite changes, fevers, chills, fatigue, unexplained weight changes. Denies hearing loss, neck lumps or masses, mouth sores, ringing in ears or voice changes. Denies cough or wheezing.  Denies shortness of breath. Denies chest pain or palpitations. Denies leg swelling. Denies abdominal distention, pain, blood in stools, constipation, diarrhea, nausea, vomiting, or early satiety. Denies pain with intercourse, dysuria, frequency, hematuria or incontinence. Denies hot flashes, pelvic pain, vaginal bleeding or vaginal discharge.   Denies joint pain, back pain or muscle pain/cramps. Denies itching, rash, or wounds. Denies dizziness, headaches, numbness or seizures. Denies swollen lymph nodes or glands, denies easy bruising or bleeding. Denies anxiety, depression, confusion, or decreased concentration.  Physical Exam: BP 138/78 (BP Location: Left Arm, Patient Position: Sitting)    Pulse 90    Temp 97.9 F (36.6 C)    Resp 16    Ht 5\' 6"  (1.676 m)    Wt 158 lb 12.8 oz (72 kg)    SpO2 96%    BMI 25.63 kg/m  General: Alert, oriented, no acute distress. HEENT: Normocephalic, atraumatic, sclera anicteric. Chest: Clear to auscultation bilaterally.  No wheezes or rhonchi. Cardiovascular: Regular rate and rhythm, no murmurs. Abdomen: soft, nontender.  Normoactive bowel sounds.  No masses or hepatosplenomegaly appreciated.  Umbilical hernia palpated. Extremities: Grossly normal range of motion.  Warm, well  perfused.  No edema bilaterally. Lymphatics: No cervical, supraclavicular, or inguinal adenopathy. GU: External female genitalia notable for significant atrophy and loss of architecture.  Almost all of bilateral labia are replaced by areas of desquamation, increased in size since her last visit.  No exudate.  Some mild erythema along the left superior labia.  Picture taken in media with patient's verbal consent.  Vaginal agglutination again noted, but 1 digit exam with no masses, nodularity, or tenderness.  Laboratory & Radiologic Studies: None new  Assessment & Plan: [name] is a 80 y.o. 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  From a symptom standpoint, the patient has had relatively little change and is doing well using topical estrogen cream.  Given appearance of her vulva today, I think that we need to return to the operating room for an exam under anesthesia, biopsies, and likely treatment either with laser or excision.  Given diffuse area involved, I favor laser ablation.  There is 1 area on the left upper labia within the desquamation that looks a little erythematous and concerns me more.  We discussed possibility of biopsy here in clinic today, patient prefers to return to the operating room under anesthesia as we have done in the past.  She would like to wait until after the holidays, we will plan for surgery in early January to include exam under anesthesia, vulvar biopsies, possible vulvar laser ablation, possible vulvectomy.   34 minutes of total time was spent for this patient encounter, including preparation, face-to-face counseling with the patient and coordination of care, and documentation of the encounter.  Jeral Pinch, MD  Division of Gynecologic Oncology  Department of Obstetrics and Gynecology   [facility]

## 2021-02-17 NOTE — Patient Instructions (Signed)
Preparing for your Surgery  Plan for surgery on March 11, 2021 with Dr. Jeral Pinch at [facility]. You will be scheduled for an examination under anesthesia, vulvar biopsies, possible laser application to the vulva, possible vulvectomy.   We recommend purchasing several bags of frozen green peas and dividing them into ziploc bags. You will want to keep these in the freezer and have them ready to use as ice packs to the vulvar incision. Once the ice pack is no longer cold, you can get another from the freezer. The frozen peas mold to your body better than a regular ice pack.   Pre-operative Testing -You will receive a phone call from presurgical testing at [facility] to discuss surgery instructions and arrange for lab work if needed.  -Bring your insurance card, copy of an advanced directive if applicable, medication list.  -You should not be taking blood thinners or aspirin at least ten days prior to surgery unless instructed by your surgeon.  -Do not take supplements such as fish oil (omega 3), red yeast rice, turmeric before your surgery. You want to avoid medications with aspirin in them including headache powders such as BC or Goody's), Excedrin migraine.  Day Before Surgery at [facility] will be advised you can have clear liquids up until 3 hours before your surgery.    Your role in recovery Your role is to become active as soon as directed by your doctor, while still giving yourself time to heal.  Rest when you feel tired. You will be asked to do the following in order to speed your recovery:  - Cough and breathe deeply. This helps to clear and expand your lungs and can prevent pneumonia after surgery.  - [name]. Do mild physical activity. Walking or moving your legs help your circulation and body functions return to normal. Do not try to get up or walk alone the first time after surgery.   -If you develop swelling on  one leg or the other, pain in the back of your leg, redness/warmth in one of your legs, please call the office or go to the Emergency Room to have a doppler to rule out a blood clot. For shortness of breath, chest pain-seek care in the Emergency Room as soon as possible. - Actively manage your pain. Managing your pain lets you move in comfort. We will ask you to rate your pain on a scale of zero to 10. It is your responsibility to tell your doctor or nurse where and how much you hurt so your pain can be treated.  Special Considerations -Your final pathology results from surgery should be available around one week after surgery and the results will be relayed to you when available.  -FMLA forms can be faxed to [phone] and please allow 5-7 business days for completion.  Pain Management After Surgery -Make sure that you have Tylenol and Ibuprofen if you are cleared to take these medications at home to use on a regular basis after surgery for pain control. We recommend alternating the medications every hour to six hours since they work differently and are processed in the body differently for pain relief.  -Review the attached handout on narcotic use and their risks and side effects.   Bowel Regimen -It is important to prevent constipation and drink adequate amounts of liquids. You can use milk of mag you have at home.  Risks of Surgery Risks of surgery are low  but include bleeding, infection, damage to surrounding structures, re-operation, blood clots, and very rarely death.  AFTER SURGERY INSTRUCTIONS  Return to work:  2-4 weeks if applicable  Activity: 1. Be up and out of the bed during the day.  Take a nap if needed.  You may walk up steps but be careful and use the hand rail.  Stair climbing will tire you more than you think, you may need to stop part way and rest.   2. No lifting or straining for 2 weeks over 10 pounds. No pushing, pulling, straining for 2 weeks.  3. No driving for  minimum 24 hours after surgery.  Do not drive if you are taking narcotic pain medicine and make sure that your reaction time has returned.   4. You can shower as soon as the next day after surgery. Shower daily. No tub baths or submerging your body in water until cleared by your surgeon.   5. No sexual activity and nothing in the vagina for 4 weeks.  8. You may experience vulvar spotting and discharge after surgery.  The spotting is normal but if you experience heavy bleeding, call our office.  9. Take Tylenol or ibuprofen for pain. Monitor your Tylenol intake to a max of 4,000 mg in a 24 hour period.   Diet: 1. Low sodium Heart Healthy Diet is recommended but you are cleared to resume your normal (before surgery) diet after your procedure.  2. It is safe to use a laxative, such as Miralax or Colace, if you have difficulty moving your bowels. You can continue using the milk of mag you have at home.  Wound Care: 1. Keep clean and dry.  Shower daily.  Reasons to call the Doctor: Fever - Oral temperature greater than 100.4 degrees Fahrenheit Foul-smelling vaginal discharge Difficulty urinating Nausea and vomiting Increased pain at the site of the incision that is unrelieved with pain medicine. Difficulty breathing with or without chest pain New calf pain especially if only on one side Sudden, continuing increased vaginal bleeding with or without clots.   Contacts: For questions or concerns you should contact:  Dr. Jeral Pinch at [phone]  Joylene John, NP at [phone]  After Hours: call [phone] and have the GYN Oncologist paged/contacted (after 5 pm or on the weekends).  Messages sent via mychart are for non-urgent matters and are not responded to after hours so for urgent needs, please call the after hours number.

## 2021-02-17 NOTE — Patient Instructions (Signed)
Preparing for your Surgery   Plan for surgery on March 11, 2021 with Dr. Jeral Pinch at [facility]. You will be scheduled for an examination under anesthesia, vulvar biopsies, possible laser application to the vulva, possible vulvectomy.    We recommend purchasing several bags of frozen green peas and dividing them into ziploc bags. You will want to keep these in the freezer and have them ready to use as ice packs to the vulvar incision. Once the ice pack is no longer cold, you can get another from the freezer. The frozen peas mold to your body better than a regular ice pack.    Pre-operative Testing -You will receive a phone call from presurgical testing at [facility] to discuss surgery instructions and arrange for lab work if needed.   -Bring your insurance card, copy of an advanced directive if applicable, medication list.   -You should not be taking blood thinners or aspirin at least ten days prior to surgery unless instructed by your surgeon.   -Do not take supplements such as fish oil (omega 3), red yeast rice, turmeric before your surgery. You want to avoid medications with aspirin in them including headache powders such as BC or Goody's), Excedrin migraine.   Day Before Surgery at [facility] will be advised you can have clear liquids up until 3 hours before your surgery.     Your role in recovery Your role is to become active as soon as directed by your doctor, while still giving yourself time to heal.  Rest when you feel tired. You will be asked to do the following in order to speed your recovery:   - Cough and breathe deeply. This helps to clear and expand your lungs and can prevent pneumonia after surgery.  - [place]. Do mild physical activity. Walking or moving your legs help your circulation and body functions return to normal. Do not try to get up or walk alone the first time after surgery.   -If you develop  swelling on one leg or the other, pain in the back of your leg, redness/warmth in one of your legs, please call the office or go to the Emergency Room to have a doppler to rule out a blood clot. For shortness of breath, chest pain-seek care in the Emergency Room as soon as possible. - Actively manage your pain. Managing your pain lets you move in comfort. We will ask you to rate your pain on a scale of zero to 10. It is your responsibility to tell your doctor or nurse where and how much you hurt so your pain can be treated.   Special Considerations -Your final pathology results from surgery should be available around one week after surgery and the results will be relayed to you when available.   -FMLA forms can be faxed to [phone] and please allow 5-7 business days for completion.   Pain Management After Surgery -Make sure that you have Tylenol and Ibuprofen if you are cleared to take these medications at home to use on a regular basis after surgery for pain control. We recommend alternating the medications every hour to six hours since they work differently and are processed in the body differently for pain relief.   -Review the attached handout on narcotic use and their risks and side effects.    Bowel Regimen -It is important to prevent constipation and drink adequate amounts of liquids. You can use milk of  mag you have at home.   Risks of Surgery Risks of surgery are low but include bleeding, infection, damage to surrounding structures, re-operation, blood clots, and very rarely death.   AFTER SURGERY INSTRUCTIONS   Return to work:  2-4 weeks if applicable   Activity: 1. Be up and out of the bed during the day.  Take a nap if needed.  You may walk up steps but be careful and use the hand rail.  Stair climbing will tire you more than you think, you may need to stop part way and rest.    2. No lifting or straining for 2 weeks over 10 pounds. No pushing, pulling, straining for 2  weeks.   3. No driving for minimum 24 hours after surgery.  Do not drive if you are taking narcotic pain medicine and make sure that your reaction time has returned.    4. You can shower as soon as the next day after surgery. Shower daily. No tub baths or submerging your body in water until cleared by your surgeon.    5. No sexual activity and nothing in the vagina for 4 weeks.   8. You may experience vulvar spotting and discharge after surgery.  The spotting is normal but if you experience heavy bleeding, call our office.   9. Take Tylenol or ibuprofen for pain. Monitor your Tylenol intake to a max of 4,000 mg in a 24 hour period.    Diet: 1. Low sodium Heart Healthy Diet is recommended but you are cleared to resume your normal (before surgery) diet after your procedure.   2. It is safe to use a laxative, such as Miralax or Colace, if you have difficulty moving your bowels. You can continue using the milk of mag you have at home.   Wound Care: 1. Keep clean and dry.  Shower daily.   Reasons to call the Doctor: Fever - Oral temperature greater than 100.4 degrees Fahrenheit Foul-smelling vaginal discharge Difficulty urinating Nausea and vomiting Increased pain at the site of the incision that is unrelieved with pain medicine. Difficulty breathing with or without chest pain New calf pain especially if only on one side Sudden, continuing increased vaginal bleeding with or without clots.   Contacts: For questions or concerns you should contact:   Dr. Jeral Pinch at [phone]   Joylene John, NP at [phone]   After Hours: call [phone] and have the GYN Oncologist paged/contacted (after 5 pm or on the weekends).   Messages sent via mychart are for non-urgent matters and are not responded to after hours so for urgent needs, please call the after hours number.

## 2021-02-17 NOTE — Progress Notes (Signed)
Patient here her family member for follow up with Dr. Jeral Pinch and for a pre-operative discussion prior to her scheduled surgery on March 11, 2021. She is scheduled for EUA, vulvar biopsies, possible vulvar laser, possible vulvectomy. The surgery was discussed in detail.  See after visit summary for additional details.      Discussed post-op pain management in detail including the aspects of the enhanced recovery pathway. She does not want a prescription for pain medication. She will plan to use tylenol and ibuprofen she has at home. She will also plan to use milk of magnesia since that is the only medication that seems to work for her.   Discussed the use SCDs and measures to take at home to prevent DVT including frequent mobility.  Reportable signs and symptoms of DVT discussed. Post-operative instructions discussed and expectations for after surgery. Incisional care discussed as well including reportable signs and symptoms including erythema, drainage, wound separation.     5 minutes spent with the patient.  Verbalizing understanding of material discussed. No needs or concerns voiced at the end of the visit.   Advised patient and family to call for any needs.    This appointment is included in the global surgical bundle as pre-operative teaching and has no charge.

## 2021-02-17 NOTE — Progress Notes (Signed)
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  It became more noticeable in April 2020 and she attempted to have an appointment with a gynecologist however she could not get an appointment due to the the coronavirus shot downs.  She was eventually able to see a physician on August 25, 2018 at which time evaluation of the vulva confirmed a vulvar mass.  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 Marti Sleigh.    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 09/27/18: Dr Alycia Rossetti performed a radical anterior vulvectomy.  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  The resection margins were negative for carcinoma with the closest being the lateral margin 1:00 at 0.8 cm.  The carcinoma invaded 1.3 cm depth.  There was no lymphovascular or perineural invasion identified.   Postoperatively a PET/CT was obtained on October 06, 2018.  This revealed postoperative changes to the vulva.  There was no hypermetabolic local regional lymphadenopathy.  There is a questionable subtle focus of hypermetabolism in the left lobe of the liver, indeterminate for liver metastases.  MRI abdomen could be performed for further work-up.  No other potential findings of hypermetabolic distant metastatic disease were seen.   On 10/25/2018, the patient underwent bilateral superficial inguinal lymphadenectomy with fibroadipose tissue noted on the left and no carcinoma seen in 2 of 2 lymph nodes on the right.   The patient was seen in November with vulvar irritation that was intermittent in  nature.  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  She was started on triamcinolone twice a day until her symptoms improved and then transition to daily.  I saw her at the end of December and she felt some improvement in her vulvar tenderness but still had significant erythema and irritation of the vulva.  I asked her to add Desitin or similar barrier cream.  When I saw her again in February, she had no improvement and had had significant irritation with the Desitin.  We stopped all topical treatment altogether.   ON 3/25, given persistent skin changes and no improvement in symptoms, vulvar biopsied was performed. Biopsy - high grade dysplasia.   06/15/19: partial simple left vulvectomy, partial simple peri-clitoral vulvectomy, vulvar biopsies, extensive CO2 laser ablation of the vulva. VIN3 noted in two vulvectomy specimens, positive margins of left specimen. Biopsies showed VIN1.    Treatment with vaginal/vulvar estrogen since surgery.   05/09/2020: Patient taken to the operating room for vulvar biopsies, lysis of vaginal agglutination, laser ablation of the vulva.  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  Interval History: Patient seen last in August, she noted occasional vulvar irritation and pain at that time.  There were multiple areas of desquamation on her exam, slightly larger in size than at her previous visit.  Plan at that time was for repeat exam in 3 months.  Depending on findings, may proceed with another exam under anesthesia, biopsies, and possible treatment (either laser or excision).  Today, she notes that vulvar symptoms are unchanged.  She has some burning intermittently, she thinks probably several times a  week.  She very occasionally has pruritus.  She denies any vaginal bleeding, discharge, or pain.  She is using her estrogen cream every other night.  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    Past Surgical History:  Procedure Laterality Date   ABDOMINAL HYSTERECTOMY  2008   Pelvic lymphadenectomy   BREAST SURGERY     left breast cyst in areolar area 46 years ago   CO2 LASER APPLICATION N/A 3/0/1601   Procedure: C02 LASER OF VULVAR, VULVAR BIOPSIES,  SIMPLE VULVECTOMY;  Surgeon: Lafonda Mosses, MD;  Location: [facility];  Service: Gynecology;  Laterality: N/A;   CO2 LASER APPLICATION N/A 0/11/3233   Procedure: VULVAR  LASER APPLICATION;  Surgeon: Lafonda Mosses, MD;  Location: [facility];  Service: Gynecology;  Laterality: N/A;   LYMPH NODE DISSECTION Bilateral 10/25/2018   Procedure: LYMPH NODE DISSECTION,BILATERAL INGUINOFEMORAL LYMPHADECTOMY;  Surgeon: [name], MD;  Location: [facility];  Service: Gynecology;  Laterality: Bilateral;   RADICAL VULVECTOMY N/A 09/27/2018   Procedure: RADICAL VULVECTOMY;  Surgeon: [name], MD;  Location: [facility];  Service: Gynecology;  Laterality: N/A;   SUPRAPUBIC CATHETER PLACEMENT     with 2008 surgery, later removed   VULVA /PERINEUM BIOPSY N/A 05/09/2020   Procedure: VULVAR BIOPSY;  Surgeon: Lafonda Mosses, MD;  Location: [facility];  Service: Gynecology;  Laterality: N/A;    Family History  Problem Relation Age of Onset   Skin cancer Father     Social History   Socioeconomic History   Marital status: Widowed    Spouse name: Not on file   Number of children: Not on file   Years of education: Not on file   Highest education level: Not on file  Occupational History   Not on  file  Tobacco Use   Smoking status: Former    Types: Cigarettes    Quit date: 12/08/2003    Years since quitting: 17.2   Smokeless tobacco: Never   Tobacco comments:    social  Vaping Use   Vaping Use: Never used  Substance and Sexual Activity   Alcohol use: No   Drug use: No   Sexual activity: Not Currently  Other Topics Concern   Not on file  Social History Narrative   Not on file   Social Determinants of Health   Financial Resource Strain: Not on file  Food Insecurity: Not on file  Transportation Needs: Not on file  Physical Activity: Not on file  Stress: Not on file  Social Connections: Not on file    Current Medications:  Current Outpatient Medications:    albuterol (VENTOLIN HFA) 108 (90 Base) MCG/ACT inhaler, Inhale 2 puffs into the lungs every 6 (six) hours as needed for wheezing or shortness of breath., Disp: , Rfl:    aspirin 81 MG chewable tablet, Chew 81 mg by mouth daily., Disp: , Rfl:    carboxymethylcellulose (REFRESH PLUS) 0.5 %  SOLN, Place 1 drop into the right eye 4 times daily., Disp: , Rfl:    cholecalciferol (VITAMIN D3) 25 MCG (1000 UT) tablet, Take 2,000 Units by mouth every evening., Disp: , Rfl:    conjugated estrogens (PREMARIN) vaginal cream, APPLY DIME SIZE AMOUNT TO THE VULVA EVERY OTHER NIGHT, Disp: 30 g, Rfl: 3   hydrochlorothiazide (HYDRODIURIL) 25 MG tablet, Take 12.5 mg by mouth daily., Disp: , Rfl:    levothyroxine (SYNTHROID) 75 MCG tablet, Take 75 mcg by mouth daily before breakfast., Disp: , Rfl:    Omega-3 Fatty Acids (FISH OIL) 1200 MG CAPS, Take 6,000 mg by mouth every evening. , Disp: , Rfl:    rosuvastatin (CRESTOR) 5 MG tablet, Take 5 mg by mouth at bedtime., Disp: , Rfl:    lidocaine (XYLOCAINE) 5 % ointment, APPLY TO AFFECTED AREA 3 TIMES A DAY AS NEEDED (TO VULVA) (Patient not taking: Reported on 07/29/2020), Disp: 1 g, Rfl: 0   Oxcarbazepine (TRILEPTAL) 300 MG tablet, Take 150 mg by mouth. (Patient not taking: Reported on  06/19/2020), Disp: , Rfl:    senna-docusate (SENOKOT-S) 8.6-50 MG tablet, Take 2 tablets by mouth at bedtime. Do not take if having diarrhea, you can stop when on normal bowel routine (Patient not taking: Reported on 07/29/2020), Disp: 30 tablet, Rfl: 1   traMADol (ULTRAM) 50 MG tablet, Take 1 tablet (50 mg total) by mouth every 6 (six) hours as needed for up to 5 doses. (Patient not taking: Reported on 01/15/2021), Disp: 5 tablet, Rfl: 0  Review of Systems: Pertinent positives as per HPI Denies appetite changes, fevers, chills, fatigue, unexplained weight changes. Denies hearing loss, neck lumps or masses, mouth sores, ringing in ears or voice changes. Denies cough or wheezing.  Denies shortness of breath. Denies chest pain or palpitations. Denies leg swelling. Denies abdominal distention, pain, blood in stools, constipation, diarrhea, nausea, vomiting, or early satiety. Denies pain with intercourse, dysuria, frequency, hematuria or incontinence. Denies hot flashes, pelvic pain, vaginal bleeding or vaginal discharge.   Denies joint pain, back pain or muscle pain/cramps. Denies itching, rash, or wounds. Denies dizziness, headaches, numbness or seizures. Denies swollen lymph nodes or glands, denies easy bruising or bleeding. Denies anxiety, depression, confusion, or decreased concentration.  Physical Exam: BP 138/78 (BP Location: Left Arm, Patient Position: Sitting)   Pulse 90   Temp 97.9 F (36.6 C)   Resp 16   Ht 5\' 6"  (1.676 m)   Wt 158 lb 12.8 oz (72 kg)   SpO2 96%   BMI 25.63 kg/m  General: Alert, oriented, no acute distress. HEENT: Normocephalic, atraumatic, sclera anicteric. Chest: Clear to auscultation bilaterally.  No wheezes or rhonchi. Cardiovascular: Regular rate and rhythm, no murmurs. Abdomen: soft, nontender.  Normoactive bowel sounds.  No masses or hepatosplenomegaly appreciated.  Umbilical hernia palpated. Extremities: Grossly normal range of motion.  Warm, well  perfused.  No edema bilaterally. Lymphatics: No cervical, supraclavicular, or inguinal adenopathy. GU: External female genitalia notable for significant atrophy and loss of architecture.  Almost all of bilateral labia are replaced by areas of desquamation, increased in size since her last visit.  No exudate.  Some mild erythema along the left superior labia.  Picture taken in media with patient's verbal consent.  Vaginal agglutination again noted, but 1 digit exam with no masses, nodularity, or tenderness.  Laboratory & Radiologic Studies: None new  Assessment & Plan: [name] is a 80 y.o. 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  From a symptom standpoint, the patient has had relatively little change and is doing well using topical estrogen cream.  Given appearance of her vulva today, I think that we need to return to the operating room for an exam under anesthesia, biopsies, and likely treatment either with laser or excision.  Given diffuse area involved, I favor laser ablation.  There is 1 area on the left upper labia within the desquamation that looks a little erythematous and concerns me more.  We discussed possibility of biopsy here in clinic today, patient prefers to return to the operating room under anesthesia as we have done in the past.  She would like to wait until after the holidays, we will plan for surgery in early January to include exam under anesthesia, vulvar biopsies, possible vulvar laser ablation, possible vulvectomy.   34 minutes of total time was spent for this patient encounter, including preparation, face-to-face counseling with the patient and coordination of care, and documentation of the encounter.  Jeral Pinch, MD  Division of Gynecologic Oncology  Department of Obstetrics and Gynecology   [facility]

## 2021-03-04 ENCOUNTER — Other Ambulatory Visit: Payer: Self-pay

## 2021-03-04 ENCOUNTER — Encounter (HOSPITAL_BASED_OUTPATIENT_CLINIC_OR_DEPARTMENT_OTHER): Payer: Self-pay | Admitting: Gynecologic Oncology

## 2021-03-04 NOTE — Progress Notes (Signed)
Spoke w/ via phone for pre-op interview--- [name] needs dos----  ISTAT             [name] results------EKG in [place] dated 04/2020 COVID test -----patient states asymptomatic no test needed Arrive at ------- 0830 NPO after MN NO Solid Food.  Clear liquids from MN until---0730 Med rec completed Medications to take morning of surgery ----- Synthroid, bring Albuterol inhaler. Diabetic medication ----- Patient instructed no nail polish to be worn day of surgery Patient instructed to bring photo id and insurance card day of surgery Patient aware to have Driver (ride ) / caregiver    for 24 hours after surgery  Patient Special Instructions ----- Pre-Op special Istructions ----- Patient verbalized understanding of instructions that were given at this phone interview. Patient denies shortness of breath, chest pain, fever, cough at this phone interview.

## 2021-03-07 ENCOUNTER — Telehealth: Payer: Self-pay

## 2021-03-07 NOTE — Telephone Encounter (Signed)
Telephone call to [name]  to check on pre-operative status.  Patient compliant with pre-operative instructions.  Reinforced nothing to eat after midnight. Clear liquids until 0730. Patient to arrive at 0830.  No questions or concerns voiced.  Instructed to call for any needs.

## 2021-03-09 NOTE — Anesthesia Preprocedure Evaluation (Addendum)
Anesthesia Evaluation  Patient identified by MRN, date of birth, ID band Patient awake    Reviewed: Allergy & Precautions, NPO status , Patient's Chart, lab work & pertinent test results  History of Anesthesia Complications (+) PONV  Airway Mallampati: II  TM Distance: >3 FB Neck ROM: Full    Dental no notable dental hx. (+) Teeth Intact, Dental Advisory Given   Pulmonary asthma , former smoker,    Pulmonary exam normal breath sounds clear to auscultation       Cardiovascular hypertension, Pt. on medications Normal cardiovascular exam Rhythm:Regular Rate:Normal     Neuro/Psych    GI/Hepatic   Endo/Other  Hypothyroidism   Renal/GU   Female GU complaint (Vulvar CA)     Musculoskeletal   Abdominal   Peds  Hematology   Anesthesia Other Findings ALL: Codiene, Latex  Reproductive/Obstetrics                           Anesthesia Physical Anesthesia Plan  ASA: 3  Anesthesia Plan: General   Post-op Pain Management: Ofirmev IV (intra-op)   Induction: Intravenous  PONV Risk Score and Plan: 4 or greater and TIVA, Treatment may vary due to age or medical condition, Ondansetron and Dexamethasone  Airway Management Planned: LMA  Additional Equipment: None  Intra-op Plan:   Post-operative Plan:   Informed Consent:     Dental advisory given  Plan Discussed with:   Anesthesia Plan Comments:        Anesthesia Quick Evaluation

## 2021-03-11 ENCOUNTER — Ambulatory Visit (HOSPITAL_BASED_OUTPATIENT_CLINIC_OR_DEPARTMENT_OTHER): Payer: Medicare Other | Admitting: Anesthesiology

## 2021-03-11 ENCOUNTER — Ambulatory Visit (HOSPITAL_BASED_OUTPATIENT_CLINIC_OR_DEPARTMENT_OTHER)
Admission: RE | Admit: 2021-03-11 | Discharge: 2021-03-11 | Disposition: A | Discharge status: Home / Self Care | Payer: Medicare Other | Attending: Gynecologic Oncology | Admitting: Gynecologic Oncology

## 2021-03-11 ENCOUNTER — Encounter (HOSPITAL_BASED_OUTPATIENT_CLINIC_OR_DEPARTMENT_OTHER): Payer: Self-pay | Admitting: Gynecologic Oncology

## 2021-03-11 ENCOUNTER — Encounter (HOSPITAL_BASED_OUTPATIENT_CLINIC_OR_DEPARTMENT_OTHER)
Admission: RE | Disposition: A | Discharge status: Home / Self Care | Payer: Self-pay | Source: Home / Self Care | Attending: Gynecologic Oncology

## 2021-03-11 ENCOUNTER — Other Ambulatory Visit: Payer: Self-pay

## 2021-03-11 DIAGNOSIS — Z87891 Personal history of nicotine dependence: Secondary | ICD-10-CM | POA: Diagnosis not present

## 2021-03-11 DIAGNOSIS — Z8541 Personal history of malignant neoplasm of cervix uteri: Secondary | ICD-10-CM | POA: Insufficient documentation

## 2021-03-11 DIAGNOSIS — D071 Carcinoma in situ of vulva: Secondary | ICD-10-CM | POA: Insufficient documentation

## 2021-03-11 DIAGNOSIS — C519 Malignant neoplasm of vulva, unspecified: Secondary | ICD-10-CM

## 2021-03-11 DIAGNOSIS — L292 Pruritus vulvae: Secondary | ICD-10-CM

## 2021-03-11 DIAGNOSIS — N9489 Other specified conditions associated with female genital organs and menstrual cycle: Secondary | ICD-10-CM

## 2021-03-11 DIAGNOSIS — N903 Dysplasia of vulva, unspecified: Secondary | ICD-10-CM | POA: Diagnosis present

## 2021-03-11 DIAGNOSIS — N9089 Other specified noninflammatory disorders of vulva and perineum: Secondary | ICD-10-CM

## 2021-03-11 HISTORY — PX: VULVA /PERINEUM BIOPSY: SHX319

## 2021-03-11 HISTORY — PX: CO2 LASER APPLICATION: SHX5778

## 2021-03-11 LAB — POCT I-STAT, CHEM 8
BUN: 16 mg/dL (ref 8–23)
Calcium, Ion: 1.29 mmol/L (ref 1.15–1.40)
Chloride: 108 mmol/L (ref 98–111)
Creatinine, Ser: 0.8 mg/dL (ref 0.44–1.00)
Glucose, Bld: 106 mg/dL — ABNORMAL HIGH (ref 70–99)
HCT: 42 % (ref 36.0–46.0)
Hemoglobin: 14.3 g/dL (ref 12.0–15.0)
Potassium: 4.2 mmol/L (ref 3.5–5.1)
Sodium: 141 mmol/L (ref 135–145)
TCO2: 25 mmol/L (ref 22–32)

## 2021-03-11 SURGERY — EXAM UNDER ANESTHESIA
Anesthesia: General | Site: Vulva

## 2021-03-11 MED ORDER — 0.9 % SODIUM CHLORIDE (POUR BTL) OPTIME
TOPICAL | Status: DC | PRN
  Administered 2021-03-11: 1000 mL

## 2021-03-11 MED ORDER — ACETAMINOPHEN 500 MG PO TABS
ORAL_TABLET | ORAL | Status: AC
  Filled 2021-03-11: qty 2

## 2021-03-11 MED ORDER — FENTANYL CITRATE (PF) 100 MCG/2ML IJ SOLN
INTRAMUSCULAR | Status: DC | PRN
Start: 2021-03-11 — End: 2021-03-11
  Administered 2021-03-11 (×2): 50 ug via INTRAVENOUS

## 2021-03-11 MED ORDER — ONDANSETRON HCL 4 MG/2ML IJ SOLN
INTRAMUSCULAR | Status: AC
  Filled 2021-03-11: qty 2

## 2021-03-11 MED ORDER — FENTANYL CITRATE (PF) 100 MCG/2ML IJ SOLN
INTRAMUSCULAR | Status: AC
  Filled 2021-03-11: qty 2

## 2021-03-11 MED ORDER — LIDOCAINE 2% (20 MG/ML) 5 ML SYRINGE
INTRAMUSCULAR | Status: AC
  Filled 2021-03-11: qty 5

## 2021-03-11 MED ORDER — PROPOFOL 10 MG/ML IV BOLUS
INTRAVENOUS | Status: AC
  Filled 2021-03-11: qty 20

## 2021-03-11 MED ORDER — DEXAMETHASONE SODIUM PHOSPHATE 10 MG/ML IJ SOLN
INTRAMUSCULAR | Status: AC
  Filled 2021-03-11: qty 1

## 2021-03-11 MED ORDER — ACETAMINOPHEN 500 MG PO TABS
1000.0000 mg | ORAL_TABLET | ORAL | Status: AC
  Administered 2021-03-11: 1000 mg via ORAL

## 2021-03-11 MED ORDER — EPHEDRINE 5 MG/ML INJ
INTRAVENOUS | Status: AC
  Filled 2021-03-11: qty 5

## 2021-03-11 MED ORDER — PROPOFOL 500 MG/50ML IV EMUL
INTRAVENOUS | Status: DC | PRN
  Administered 2021-03-11: 100 ug/kg/min via INTRAVENOUS

## 2021-03-11 MED ORDER — PHENYLEPHRINE 40 MCG/ML (10ML) SYRINGE FOR IV PUSH (FOR BLOOD PRESSURE SUPPORT)
PREFILLED_SYRINGE | INTRAVENOUS | Status: DC | PRN
  Administered 2021-03-11 (×2): 80 ug via INTRAVENOUS

## 2021-03-11 MED ORDER — ACETAMINOPHEN 10 MG/ML IV SOLN: 1000.0000 mg | Freq: Once | INTRAVENOUS | Status: DC | PRN

## 2021-03-11 MED ORDER — BUPIVACAINE HCL 0.25 % IJ SOLN
INTRAMUSCULAR | Status: DC | PRN
Start: 2021-03-11 — End: 2021-03-11
  Administered 2021-03-11: 20 mL

## 2021-03-11 MED ORDER — DEXAMETHASONE SODIUM PHOSPHATE 10 MG/ML IJ SOLN
INTRAMUSCULAR | Status: DC | PRN
  Administered 2021-03-11: 10 mg via INTRAVENOUS

## 2021-03-11 MED ORDER — ONDANSETRON HCL 4 MG/2ML IJ SOLN
INTRAMUSCULAR | Status: DC | PRN
  Administered 2021-03-11: 4 mg via INTRAVENOUS

## 2021-03-11 MED ORDER — DEXMEDETOMIDINE (PRECEDEX) IN NS 20 MCG/5ML (4 MCG/ML) IV SYRINGE
PREFILLED_SYRINGE | INTRAVENOUS | Status: AC
  Filled 2021-03-11: qty 5

## 2021-03-11 MED ORDER — PHENYLEPHRINE 40 MCG/ML (10ML) SYRINGE FOR IV PUSH (FOR BLOOD PRESSURE SUPPORT)
PREFILLED_SYRINGE | INTRAVENOUS | Status: AC
  Filled 2021-03-11: qty 10

## 2021-03-11 MED ORDER — EPHEDRINE SULFATE-NACL 50-0.9 MG/10ML-% IV SOSY
PREFILLED_SYRINGE | INTRAVENOUS | Status: DC | PRN
  Administered 2021-03-11: 10 mg via INTRAVENOUS

## 2021-03-11 MED ORDER — FENTANYL CITRATE (PF) 100 MCG/2ML IJ SOLN: 25.0000 ug | INTRAMUSCULAR | Status: DC | PRN

## 2021-03-11 MED ORDER — ONDANSETRON HCL 4 MG/2ML IJ SOLN: 4.0000 mg | Freq: Once | INTRAMUSCULAR | Status: DC | PRN

## 2021-03-11 MED ORDER — ESTRADIOL 0.1 MG/GM VA CREA
TOPICAL_CREAM | VAGINAL | Status: DC | PRN
  Administered 2021-03-11: 1 via VAGINAL

## 2021-03-11 MED ORDER — LACTATED RINGERS IV SOLN: INTRAVENOUS | Status: DC

## 2021-03-11 MED ORDER — LIDOCAINE 2% (20 MG/ML) 5 ML SYRINGE
INTRAMUSCULAR | Status: DC | PRN
  Administered 2021-03-11: 60 mg via INTRAVENOUS

## 2021-03-11 MED ORDER — PROPOFOL 10 MG/ML IV BOLUS
INTRAVENOUS | Status: DC | PRN
  Administered 2021-03-11: 100 mg via INTRAVENOUS

## 2021-03-11 SURGICAL SUPPLY — 27 items
BLADE SURG 15 STRL LF DISP TIS (BLADE) ×2 IMPLANT
BLADE SURG 15 STRL SS (BLADE) ×3
DEPRESSOR TONGUE BLADE STERILE (MISCELLANEOUS) ×3 IMPLANT
DRSG TELFA 3X8 NADH (GAUZE/BANDAGES/DRESSINGS) ×3 IMPLANT
GAUZE 4X4 16PLY ~~LOC~~+RFID DBL (SPONGE) ×8 IMPLANT
GLOVE SURG POLYISO LF SZ6 (GLOVE) ×4 IMPLANT
GLOVE SURG UNDER POLY LF SZ7 (GLOVE) ×4 IMPLANT
GOWN STRL REUS W/TWL LRG LVL3 (GOWN DISPOSABLE) ×5 IMPLANT
KIT TURNOVER CYSTO (KITS) ×3 IMPLANT
MANIFOLD NEPTUNE II (INSTRUMENTS) ×2 IMPLANT
NDL HYPO 25X1 1.5 SAFETY (NEEDLE) ×1 IMPLANT
NEEDLE HYPO 25X1 1.5 SAFETY (NEEDLE) ×3 IMPLANT
NS IRRIG 1000ML POUR BTL (IV SOLUTION) ×2 IMPLANT
PACK PERINEAL COLD (PAD) ×3 IMPLANT
PACK VAGINAL WOMENS (CUSTOM PROCEDURE TRAY) ×3 IMPLANT
PAD DRESSING TELFA 3X8 NADH (GAUZE/BANDAGES/DRESSINGS) IMPLANT
PAD PREP 24X48 CUFFED NSTRL (MISCELLANEOUS) ×3 IMPLANT
PENCIL SMOKE EVACUATOR (MISCELLANEOUS) ×2 IMPLANT
PUNCH BIOPSY 3 (MISCELLANEOUS) ×2 IMPLANT
SUT VIC AB 0 SH 27 (SUTURE) ×3 IMPLANT
SUT VIC AB 3-0 SH 27 (SUTURE) ×3
SUT VIC AB 3-0 SH 27X BRD (SUTURE) ×2 IMPLANT
SUT VIC AB 4-0 PS2 18 (SUTURE) ×3 IMPLANT
SYR BULB IRRIG 60ML STRL (SYRINGE) ×3 IMPLANT
TOWEL OR 17X26 10 PK STRL BLUE (TOWEL DISPOSABLE) ×4 IMPLANT
TUBE CONNECTING 12X1/4 (SUCTIONS) ×2 IMPLANT
VACUUM HOSE 7/8X10 W/ WAND (MISCELLANEOUS) ×2 IMPLANT

## 2021-03-11 NOTE — Interval H&P Note (Signed)
History and Physical Interval Note:  03/11/2021 8:55 AM  [name]  has presented today for surgery, with the diagnosis of VULVAR CANCER.  The various methods of treatment have been discussed with the patient and family. After consideration of risks, benefits and other options for treatment, the patient has consented to  Procedure(s): EXAM UNDER ANESTHESIA (N/A) VULVAR BIOPSY (N/A) POSSIBLE CO2 LASER APPLICATION TO VULVA (N/A) POSSIBLE VULVECTOMY (N/A) as a surgical intervention.  The patient's history has been reviewed, patient examined, no change in status, stable for surgery.  I have reviewed the patient's chart and labs.  Questions were answered to the patient's satisfaction.     Lafonda Mosses

## 2021-03-11 NOTE — Transfer of Care (Signed)
Immediate Anesthesia Transfer of Care Note  Patient: [name]  Procedure(s) Performed: Jasmine December UNDER ANESTHESIA (Vulva) VULVAR BIOPSY (Vulva) CO2 LASER APPLICATION TO VULVA  Patient Location: PACU  Anesthesia Type:General  Level of Consciousness: awake, alert , oriented and patient cooperative  Airway & Oxygen Therapy: Patient Spontanous Breathing  Post-op Assessment: Report given to RN and Post -op Vital signs reviewed and stable  Post vital signs: Reviewed and stable  Last Vitals:  Vitals Value Taken Time  BP 99/56 03/11/21 1003  Temp 36.4 C 03/11/21 1007  Pulse 66 03/11/21 1008  Resp 15 03/11/21 1008  SpO2 93 % 03/11/21 1008  Vitals shown include unvalidated device data.  Last Pain:  Vitals:   03/11/21 1007  TempSrc: Oral  PainSc:       Patients Stated Pain Goal: 4 (76/39/43 2003)  Complications: No notable events documented.

## 2021-03-11 NOTE — Op Note (Signed)
PATIENT: Fradel Baldonado DATE: 03/11/21  Preop Diagnosis: Vulvar erythema, desquamation in the setting of prior vulvar cancer and dysplasia  Postoperative Diagnosis: same as above  Surgery: EUA, vulvar biopsies, CO2 laser of the vulva  Surgeons:  Valarie Cones, MD Assistant: none  Anesthesia: General   Estimated blood loss: <15 ml  IVF:  see I&O flowsheet   Urine output: n/a   Complications: None apparent  Pathology: multiple vulvar biopsies  Operative findings: On EUA, mild erythema and desquamation along most of superior right labia and around the clitoris. Some desquamation noted along inner right labia just lateral to the urethral. Atrophy noted with agglutination of the posterior vagina. Multiple biopsies taken of areas that are mildly raised and erythematous, most concerning for possible dysplasia. After biopsies taken, areas most concerning for dysplasia treated with CO2 laser.   Procedure: The patient was identified in the preoperative holding area. Informed consent was signed on the chart. Patient was seen history was reviewed and exam was performed.   The patient was then taken to the operating room and placed in the supine position with SCD hose on. General anesthesia was then induced without difficulty. She was then placed in the dorsolithotomy position. The perineum was prepped with Betadine. The vagina was prepped with Betadine. The patient was then draped after the prep was dried.   Timeout was performed the patient, procedure, antibiotic, allergy, and length of procedure.  The vulvar tissues were inspected. The lesions were identified as noted above. The subcuticular tissues were infiltrated with 0.25% marcaine.   Multiple biopsies taken with a 3 mm punch biopsy.  The patient's surgical field was draped with wet towels. The staff and patient ensured laser-safe eyewear and masks were fitted. The laser was set to 8 watts continuous. The laser was tested for accuracy  on a tongue depressor.  The laser was applied to the circumscribed area of the vulva that had been previously identified. The tissue was ablated to the desired depth and the eschar was removed with a moistened sponge. When the entire lesion had been ablated the procedure was complete.  Premarin cream was applied to the laser site.  All instrument, suture, laparotomy, Ray-Tec, and needle counts were correct x2. The patient tolerated the procedure well and was taken recovery room in stable condition.   Jeral Pinch MD Gynecologic Oncology

## 2021-03-11 NOTE — Anesthesia Postprocedure Evaluation (Signed)
Anesthesia Post Note  Patient: [name]  Procedure(s) Performed: EXAM UNDER ANESTHESIA (Vulva) VULVAR BIOPSY (Vulva) CO2 LASER APPLICATION TO VULVA     Patient location during evaluation: PACU Anesthesia Type: General Level of consciousness: awake and alert Pain management: pain level controlled Vital Signs Assessment: post-procedure vital signs reviewed and stable Respiratory status: spontaneous breathing, nonlabored ventilation, respiratory function stable and patient connected to nasal cannula oxygen Cardiovascular status: blood pressure returned to baseline and stable Postop Assessment: no apparent nausea or vomiting Anesthetic complications: no   No notable events documented.  Last Vitals:  Vitals:   03/11/21 1043 03/11/21 1155  BP:  123/63  Pulse: (!) 55 (!) 59  Resp: 19 13  Temp:  (!) 36.4 C  SpO2: 100% 94%    Last Pain:  Vitals:   03/11/21 1155  TempSrc:   PainSc: 0-No pain                 Jayvin Hurrell L Charlies Rayburn

## 2021-03-11 NOTE — Discharge Instructions (Addendum)
Post Operative Instructions Following Laser Surgery  Laser treatment was used today after biopsies were taken of your vulva.  First 24 h Sit in a tub or sitz bath of COOL water for 20 minutes 1-3 times a day.  After the bath, carefully blot the area dry and place estrogen cream over vulva.  You may sit on a covered ice pack between the baths as needed or on a circular pillow.    If you become uncomfortable, call the office for instructions.  You may eat whatever you feel like.   Beginning the day after your surgery Sit in a tub of cool to warm water for 15 minutes at least 1 time a day and after bowel movement Drainage is usually a pink to tan color and is normal for the following 2-3 weeks after surgery. You can use a peripad to help collect any drainage.  Diet Eat a regular diet.  Avoid foods that may constipate you or give you diarrhea. Beginning the day after surgery, drink 6-8 glasses of water a day in addition to your meals.  Limit you caffeine intake to 1-2 servings per day.  Bowel Habits You should have a bowel movement at least every other day.  If you are constipated, you may take a Fleet enema or 2 Dulcolax tablets.  Call the office if no results occur. You may bear down a normal amount to have a bowel movement without hurting your tissues after the operation.  Activity Walking is encouraged.  You may drive when you are no longer on prescription pain medication.  You may go up and down stairs carefully. No heavy lifting or strenuous activity until after your first post operative appointment Do NOT sit on a rubber ring; instead use a soft pillow if needed.  Call the office if you have any questions or concerns.  Call IMMEDIATELY if you develop: Fever greater than 100 F Difficulty urinating  Post Anesthesia Home Care Instructions  Activity: Get plenty of rest for the remainder of the day. A responsible adult should stay with you for 24 hours following the procedure.  For  the next 24 hours, DO NOT: -Drive a car -Paediatric nurse -Drink alcoholic beverages -Take any medication unless instructed by your physician -Make any legal decisions or sign important papers.  Meals: Start with liquid foods such as gelatin or soup. Progress to regular foods as tolerated. Avoid greasy, spicy, heavy foods. If nausea and/or vomiting occur, drink only clear liquids until the nausea and/or vomiting subsides. Call your physician if vomiting continues.  Special Instructions/Symptoms: Your throat may feel dry or sore from the anesthesia or the breathing tube placed in your throat during surgery. If this causes discomfort, gargle with warm salt water. The discomfort should disappear within 24 hours.  Next dose of tylenol/acetaminophen until after 3 pm today if needed.  Marland Kitchen

## 2021-03-11 NOTE — Anesthesia Procedure Notes (Signed)
Procedure Name: LMA Insertion Date/Time: 03/11/2021 9:31 AM Performed by: Rogers Blocker, CRNA Pre-anesthesia Checklist: Patient identified, Emergency Drugs available, Suction available and Patient being monitored Patient Re-evaluated:Patient Re-evaluated prior to induction Oxygen Delivery Method: Circle System Utilized Preoxygenation: Pre-oxygenation with 100% oxygen Induction Type: IV induction Ventilation: Mask ventilation without difficulty LMA: LMA inserted LMA Size: 4.0 Number of attempts: 1 Airway Equipment and Method: Bite block Placement Confirmation: positive ETCO2 Tube secured with: Tape Dental Injury: Teeth and Oropharynx as per pre-operative assessment

## 2021-03-12 ENCOUNTER — Telehealth: Payer: Self-pay

## 2021-03-12 ENCOUNTER — Encounter (HOSPITAL_BASED_OUTPATIENT_CLINIC_OR_DEPARTMENT_OTHER): Payer: Self-pay | Admitting: Gynecologic Oncology

## 2021-03-12 LAB — SURGICAL PATHOLOGY

## 2021-03-12 NOTE — Telephone Encounter (Signed)
Spoke with [name] this morning. She states she is eating, drinking and urinating well. She has not had a BM yet but is passing gas.  Encouraged her to drink plenty of water and take stool softeners/laxatives as needed. She denies fever or chills. She reports some light pink spotting which is expected.  She rates her pain 0/10. She takes tylenol as needed.   Instructed to call office with any fever, chills, purulent drainage, uncontrolled pain or any other questions or concerns. Patient verbalizes understanding.   Pt aware of post op appointments as well as the office number [phone] and after hours number [phone] to call if she has any questions or concerns

## 2021-03-13 ENCOUNTER — Telehealth: Payer: Self-pay | Admitting: Gynecologic Oncology

## 2021-03-13 NOTE — Telephone Encounter (Signed)
Spoke with patient's daughter regarding biopsies. Given high grade dysplasia, I recommend treatment. Discussed options of laser therapy, combination of excision and laser, or large skinning vulvectomy. Daughter will talk with the patient and knows that Joylene John, NP from my office will call tomorrow or early next week to work on date for surgery.   Jeral Pinch MD Gynecologic Oncology

## 2021-03-14 ENCOUNTER — Telehealth: Payer: Self-pay | Admitting: Gynecologic Oncology

## 2021-03-14 NOTE — Telephone Encounter (Signed)
Spoke with patient's daughter to follow up on the conversation about treatment options. She states her mother would like to proceed with vulvar laser. She is unable to do Wednesdays. Advised we would find some dates and contact her to see what would work best. No concerns or needs voiced.

## 2021-03-14 NOTE — Telephone Encounter (Signed)
Returned call to the patient's daughter. She would like to proceed with surgery on the available date of March 25, 2021. Advised she will receive a phone call from the [place] closer to the date.

## 2021-03-18 ENCOUNTER — Other Ambulatory Visit: Payer: Self-pay

## 2021-03-18 ENCOUNTER — Encounter (HOSPITAL_BASED_OUTPATIENT_CLINIC_OR_DEPARTMENT_OTHER): Payer: Self-pay | Admitting: Gynecologic Oncology

## 2021-03-18 NOTE — Progress Notes (Signed)
Spoke w/ via phone for pre-op interview---patient Lab needs dos---- none              Lab results------EKG 05/06/20; Istat 03/11/21 COVID test -----patient states asymptomatic no test needed Arrive at -------08:45 03/25/21. NPO after MN NO Solid Food.   Clear liquids from MN until---0745 Med rec completed Medications to take morning of surgery -----Synthroid and inhaler if needed; bring inhaler Diabetic medication -----NA Patient instructed no nail polish to be worn day of surgery Patient instructed to bring photo id and insurance card day of surgery Patient aware to have Driver (ride ) / caregiver    for 24 hours after surgery  Patient Special Instructions -----NA Pre-Op special Istructions -----NA Patient verbalized understanding of instructions that were given at this phone interview. Patient denies shortness of breath, chest pain, fever, cough at this phone interview.

## 2021-03-24 ENCOUNTER — Telehealth: Payer: Self-pay

## 2021-03-24 NOTE — Telephone Encounter (Signed)
Telephone call to check on pre-operative status.  Patient compliant with pre-operative instructions.  Reinforced nothing to eat after midnight. Clear liquids until 0745. Patient to arrive at [place].  No questions or concerns voiced.  Instructed to call for any needs.

## 2021-03-25 ENCOUNTER — Ambulatory Visit (HOSPITAL_BASED_OUTPATIENT_CLINIC_OR_DEPARTMENT_OTHER): Payer: Medicare Other | Admitting: Anesthesiology

## 2021-03-25 ENCOUNTER — Encounter (HOSPITAL_BASED_OUTPATIENT_CLINIC_OR_DEPARTMENT_OTHER)
Admission: RE | Disposition: A | Discharge status: Home / Self Care | Payer: Self-pay | Source: Home / Self Care | Attending: Gynecologic Oncology

## 2021-03-25 ENCOUNTER — Encounter (HOSPITAL_BASED_OUTPATIENT_CLINIC_OR_DEPARTMENT_OTHER): Payer: Self-pay | Admitting: Gynecologic Oncology

## 2021-03-25 ENCOUNTER — Ambulatory Visit (HOSPITAL_BASED_OUTPATIENT_CLINIC_OR_DEPARTMENT_OTHER)
Admission: RE | Admit: 2021-03-25 | Discharge: 2021-03-25 | Disposition: A | Discharge status: Home / Self Care | Payer: Medicare Other | Attending: Gynecologic Oncology | Admitting: Gynecologic Oncology

## 2021-03-25 DIAGNOSIS — E039 Hypothyroidism, unspecified: Secondary | ICD-10-CM | POA: Insufficient documentation

## 2021-03-25 DIAGNOSIS — J45909 Unspecified asthma, uncomplicated: Secondary | ICD-10-CM | POA: Insufficient documentation

## 2021-03-25 DIAGNOSIS — N900 Mild vulvar dysplasia: Secondary | ICD-10-CM | POA: Insufficient documentation

## 2021-03-25 DIAGNOSIS — Z90710 Acquired absence of both cervix and uterus: Secondary | ICD-10-CM | POA: Diagnosis not present

## 2021-03-25 DIAGNOSIS — Z8544 Personal history of malignant neoplasm of other female genital organs: Secondary | ICD-10-CM | POA: Insufficient documentation

## 2021-03-25 DIAGNOSIS — D071 Carcinoma in situ of vulva: Secondary | ICD-10-CM

## 2021-03-25 DIAGNOSIS — N901 Moderate vulvar dysplasia: Secondary | ICD-10-CM | POA: Insufficient documentation

## 2021-03-25 DIAGNOSIS — N9089 Other specified noninflammatory disorders of vulva and perineum: Secondary | ICD-10-CM | POA: Insufficient documentation

## 2021-03-25 DIAGNOSIS — Z87891 Personal history of nicotine dependence: Secondary | ICD-10-CM | POA: Insufficient documentation

## 2021-03-25 DIAGNOSIS — E785 Hyperlipidemia, unspecified: Secondary | ICD-10-CM | POA: Diagnosis not present

## 2021-03-25 DIAGNOSIS — C519 Malignant neoplasm of vulva, unspecified: Secondary | ICD-10-CM

## 2021-03-25 DIAGNOSIS — I10 Essential (primary) hypertension: Secondary | ICD-10-CM | POA: Insufficient documentation

## 2021-03-25 DIAGNOSIS — N903 Dysplasia of vulva, unspecified: Secondary | ICD-10-CM | POA: Diagnosis present

## 2021-03-25 HISTORY — PX: CO2 LASER APPLICATION: SHX5778

## 2021-03-25 HISTORY — PX: VULVA /PERINEUM BIOPSY: SHX319

## 2021-03-25 SURGERY — BIOPSY, VULVA
Anesthesia: General | Site: Vagina

## 2021-03-25 MED ORDER — SILVER SULFADIAZINE 1 % EX CREA
TOPICAL_CREAM | CUTANEOUS | Status: DC | PRN
  Administered 2021-03-25: 1 via TOPICAL

## 2021-03-25 MED ORDER — LACTATED RINGERS IV SOLN: INTRAVENOUS | Status: DC

## 2021-03-25 MED ORDER — LIDOCAINE HCL (PF) 2 % IJ SOLN
INTRAMUSCULAR | Status: AC
  Filled 2021-03-25: qty 5

## 2021-03-25 MED ORDER — PROPOFOL 10 MG/ML IV BOLUS
INTRAVENOUS | Status: DC | PRN
Start: 2021-03-25 — End: 2021-03-25
  Administered 2021-03-25: 110 mg via INTRAVENOUS

## 2021-03-25 MED ORDER — LIDOCAINE HCL 1 % IJ SOLN
INTRAMUSCULAR | Status: DC | PRN
  Administered 2021-03-25: 10 mL

## 2021-03-25 MED ORDER — FENTANYL CITRATE (PF) 100 MCG/2ML IJ SOLN: 25.0000 ug | INTRAMUSCULAR | Status: DC | PRN

## 2021-03-25 MED ORDER — FENTANYL CITRATE (PF) 100 MCG/2ML IJ SOLN
INTRAMUSCULAR | Status: DC | PRN
  Administered 2021-03-25 (×2): 25 ug via INTRAVENOUS

## 2021-03-25 MED ORDER — DEXAMETHASONE SODIUM PHOSPHATE 10 MG/ML IJ SOLN
INTRAMUSCULAR | Status: AC
  Filled 2021-03-25: qty 1

## 2021-03-25 MED ORDER — 0.9 % SODIUM CHLORIDE (POUR BTL) OPTIME
TOPICAL | Status: DC | PRN
Start: 2021-03-25 — End: 2021-03-25
  Administered 2021-03-25: 1000 mL

## 2021-03-25 MED ORDER — ONDANSETRON HCL 4 MG/2ML IJ SOLN: 4.0000 mg | Freq: Once | INTRAMUSCULAR | Status: DC | PRN

## 2021-03-25 MED ORDER — ACETAMINOPHEN 500 MG PO TABS
ORAL_TABLET | ORAL | Status: AC
  Filled 2021-03-25: qty 2

## 2021-03-25 MED ORDER — DEXAMETHASONE SODIUM PHOSPHATE 10 MG/ML IJ SOLN
INTRAMUSCULAR | Status: DC | PRN
  Administered 2021-03-25: 5 mg via INTRAVENOUS

## 2021-03-25 MED ORDER — ONDANSETRON HCL 4 MG/2ML IJ SOLN
INTRAMUSCULAR | Status: AC
  Filled 2021-03-25: qty 2

## 2021-03-25 MED ORDER — ACETAMINOPHEN 500 MG PO TABS
1000.0000 mg | ORAL_TABLET | ORAL | Status: AC
  Administered 2021-03-25: 1000 mg via ORAL

## 2021-03-25 MED ORDER — LIDOCAINE 2% (20 MG/ML) 5 ML SYRINGE
INTRAMUSCULAR | Status: DC | PRN
  Administered 2021-03-25: 50 mg via INTRAVENOUS

## 2021-03-25 MED ORDER — FENTANYL CITRATE (PF) 100 MCG/2ML IJ SOLN
INTRAMUSCULAR | Status: AC
  Filled 2021-03-25: qty 2

## 2021-03-25 SURGICAL SUPPLY — 41 items
BLADE CLIPPER SENSICLIP SURGIC (BLADE) IMPLANT
BLADE SURG 15 STRL LF DISP TIS (BLADE) ×1 IMPLANT
BLADE SURG 15 STRL SS (BLADE) ×2
CANISTER SUCT 1200ML W/VALVE (MISCELLANEOUS) IMPLANT
CATH ROBINSON RED A/P 16FR (CATHETERS) ×1 IMPLANT
DEPRESSOR TONGUE BLADE STERILE (MISCELLANEOUS) ×2 IMPLANT
DRSG TELFA 3X8 NADH (GAUZE/BANDAGES/DRESSINGS) ×2 IMPLANT
ELECT BALL LEEP 3MM BLK (ELECTRODE) IMPLANT
GAUZE 4X4 16PLY ~~LOC~~+RFID DBL (SPONGE) ×3 IMPLANT
GLOVE SURG ENC MOIS LTX SZ6 (GLOVE) ×4 IMPLANT
GOWN STRL REUS W/TWL LRG LVL3 (GOWN DISPOSABLE) ×2 IMPLANT
KIT TURNOVER CYSTO (KITS) ×2 IMPLANT
NDL HYPO 25X1 1.5 SAFETY (NEEDLE) ×1 IMPLANT
NEEDLE HYPO 25X1 1.5 SAFETY (NEEDLE) ×2 IMPLANT
NS IRRIG 500ML POUR BTL (IV SOLUTION) ×2 IMPLANT
PACK PERINEAL COLD (PAD) ×2 IMPLANT
PACK VAGINAL WOMENS (CUSTOM PROCEDURE TRAY) ×2 IMPLANT
PAD DRESSING TELFA 3X8 NADH (GAUZE/BANDAGES/DRESSINGS) IMPLANT
PAD PREP 24X48 CUFFED NSTRL (MISCELLANEOUS) ×2 IMPLANT
PENCIL BUTTON HOLSTER BLD 10FT (ELECTRODE) ×2 IMPLANT
PUNCH BIOPSY DERMAL 3 (INSTRUMENTS) IMPLANT
PUNCH BIOPSY DERMAL 3MM (INSTRUMENTS)
PUNCH BIOPSY DERMAL 4MM (INSTRUMENTS) IMPLANT
SUT VIC AB 0 CT1 36 (SUTURE) IMPLANT
SUT VIC AB 0 SH 27 (SUTURE) ×2 IMPLANT
SUT VIC AB 2-0 CT2 27 (SUTURE) IMPLANT
SUT VIC AB 2-0 SH 27 (SUTURE)
SUT VIC AB 2-0 SH 27XBRD (SUTURE) IMPLANT
SUT VIC AB 3-0 PS2 18 (SUTURE)
SUT VIC AB 3-0 PS2 18XBRD (SUTURE) IMPLANT
SUT VIC AB 3-0 SH 27 (SUTURE) ×2
SUT VIC AB 3-0 SH 27X BRD (SUTURE) ×1 IMPLANT
SUT VIC AB 4-0 PS2 18 (SUTURE) ×2 IMPLANT
SUT VICRYL 0 UR6 27IN ABS (SUTURE) ×1 IMPLANT
SUT VICRYL 4-0 PS2 18IN ABS (SUTURE) ×1 IMPLANT
SWAB OB GYN 8IN STERILE 2PK (MISCELLANEOUS) ×4 IMPLANT
SYR BULB IRRIG 60ML STRL (SYRINGE) ×2 IMPLANT
TOWEL OR 17X26 10 PK STRL BLUE (TOWEL DISPOSABLE) ×2 IMPLANT
TUBE CONNECTING 12X1/4 (SUCTIONS) IMPLANT
VACUUM HOSE 7/8X10 W/ WAND (MISCELLANEOUS) IMPLANT
WATER STERILE IRR 500ML POUR (IV SOLUTION) ×2 IMPLANT

## 2021-03-25 NOTE — Anesthesia Postprocedure Evaluation (Signed)
Anesthesia Post Note  Patient: [name]  Procedure(s) Performed: VULVAR BIOPSY (Vagina ) CO2 LASER APPLICATION TO VULVA (Vagina )     Patient location during evaluation: PACU Anesthesia Type: General Level of consciousness: awake and alert and oriented Pain management: pain level controlled Vital Signs Assessment: post-procedure vital signs reviewed and stable Respiratory status: spontaneous breathing, nonlabored ventilation and respiratory function stable Cardiovascular status: blood pressure returned to baseline and stable Postop Assessment: no apparent nausea or vomiting Anesthetic complications: no   No notable events documented.  Last Vitals:  Vitals:   03/25/21 1135 03/25/21 1145  BP: 112/68 117/65  Pulse: 62 63  Resp: 14 13  Temp: 36.4 C   SpO2: 98% 99%    Last Pain:  Vitals:   03/25/21 1200  TempSrc:   PainSc: 0-No pain                 Abella Shugart A.

## 2021-03-25 NOTE — H&P (Signed)
Gynecologic Oncology H&P   Treatment History: She had a palpable vulvar lump in the winter 2019 and 2020.  It became more noticeable in April 2020 and she attempted to have an appointment with a gynecologist however she could not get an appointment due to the the coronavirus shot downs.  She was eventually able to see a physician on August 25, 2018 at which time evaluation of the vulva confirmed a vulvar mass.  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 Marti Sleigh.    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 09/27/18: Dr Alycia Rossetti performed a radical anterior vulvectomy.  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  The resection margins were negative for carcinoma with the closest being the lateral margin 1:00 at 0.8 cm.  The carcinoma invaded 1.3 cm depth.  There was no lymphovascular or perineural invasion identified.   Postoperatively a PET/CT was obtained on October 06, 2018.  This revealed postoperative changes to the vulva.  There was no hypermetabolic local regional lymphadenopathy.  There is a questionable subtle focus of hypermetabolism in the left lobe of the liver, indeterminate for liver metastases.  MRI abdomen could be performed for further work-up.  No other potential findings of hypermetabolic distant metastatic disease were seen.   On 10/25/2018, the patient underwent bilateral superficial inguinal lymphadenectomy with fibroadipose tissue noted on the left and no carcinoma seen in 2 of 2 lymph nodes on the right.   The patient was seen in November with vulvar irritation that was intermittent in nature.  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  She was started on triamcinolone twice a day  until her symptoms improved and then transition to daily.  I saw her at the end of December and she felt some improvement in her vulvar tenderness but still had significant erythema and irritation of the vulva.  I asked her to add Desitin or similar barrier cream.  When I saw her again in February, she had no improvement and had had significant irritation with the Desitin.  We stopped all topical treatment altogether.   ON 3/25, given persistent skin changes and no improvement in symptoms, vulvar biopsied was performed. Biopsy - high grade dysplasia.   06/15/19: partial simple left vulvectomy, partial simple peri-clitoral vulvectomy, vulvar biopsies, extensive CO2 laser ablation of the vulva. VIN3 noted in two vulvectomy specimens, positive margins of left specimen. Biopsies showed VIN1.    Treatment with vaginal/vulvar estrogen since surgery.   05/09/2020: Patient taken to the operating room for vulvar biopsies, lysis of vaginal agglutination, laser ablation of the vulva.  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  Interval History: Doing well, continues to have vulvar symptoms.   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    Past Surgical History:  Procedure Laterality Date   ABDOMINAL HYSTERECTOMY  2008   Pelvic lymphadenectomy   BREAST SURGERY     left breast cyst in areolar area 46 years ago   CO2 LASER APPLICATION N/A 08/15/6293   Procedure: C02 LASER OF VULVAR, VULVAR BIOPSIES,  SIMPLE VULVECTOMY;  Surgeon: Lafonda Mosses, MD;  Location: [facility];  Service: Gynecology;  Laterality: N/A;   CO2 LASER APPLICATION N/A 04/16/4130   Procedure: VULVAR  LASER APPLICATION;  Surgeon: Lafonda Mosses, MD;  Location: [facility];  Service: Gynecology;  Laterality: N/A;   CO2 LASER APPLICATION N/A 06/10/100   Procedure: CO2 LASER APPLICATION TO VULVA;  Surgeon: Lafonda Mosses, MD;  Location[facility];  Service: Gynecology;  Laterality: N/A;   LYMPH NODE DISSECTION Bilateral 10/25/2018   Procedure: LYMPH NODE DISSECTION,BILATERAL INGUINOFEMORAL LYMPHADECTOMY;  Surgeon: [name], MD;  Location: [facility];  Service: Gynecology;  Laterality: Bilateral;   RADICAL VULVECTOMY N/A 09/27/2018   Procedure: RADICAL VULVECTOMY;  Surgeon: [name], MD;  Location: [facility];  Service: Gynecology;  Laterality: N/A;   SUPRAPUBIC CATHETER PLACEMENT     with 2008 surgery, later removed   VULVA /PERINEUM BIOPSY N/A 05/09/2020   Procedure: VULVAR BIOPSY;  Surgeon: Lafonda Mosses, MD;  Location: [facility];  Service: Gynecology;  Laterality: N/A;   VULVA Milagros Loll BIOPSY N/A 03/11/2021   Procedure: VULVAR BIOPSY;  Surgeon: Lafonda Mosses, MD;  Location: [facility];  Service: Gynecology;  Laterality: N/A;    Family History  Problem Relation Age of Onset   Skin cancer Father     Social History   Socioeconomic History   Marital status: Widowed    Spouse name: Not on file   Number of children: Not on file   Years of education: Not on file   Highest education level: Not on file  Occupational History   Not on file  Tobacco Use   Smoking status: Former    Types: Cigarettes    Quit date: 12/08/2003    Years since quitting: 17.3   Smokeless tobacco: Never   Tobacco comments:    social  Vaping Use   Vaping Use: Never used  Substance and Sexual Activity   Alcohol use: No   Drug use: No   Sexual activity: Not Currently  Other  Topics Concern   Not on file  Social History Narrative   Not on file   Social Determinants of Health   Financial Resource Strain: Not on file  Food Insecurity: Not on file  Transportation Needs: Not on file  Physical Activity: Not on file  Stress: Not on file  Social Connections: Not on file    Current Medications:  Current Facility-Administered Medications:    lactated ringers infusion, , Intravenous, Continuous, Woodrum, Chelsey L, MD, Last Rate: 10 mL/hr at 03/25/21 0938, Continued from Pre-op at 03/25/21 0938  Physical Exam: BP 121/74    Pulse 70    Temp 97.9 F (36.6 C) (Oral)    Resp 16    Ht 5\' 6"  (1.676 m)    Wt 157 lb 12.8 oz (71.6 kg)    SpO2 96%    BMI 25.47 kg/m  General: Alert, oriented, no acute distress. HEENT: Normocephalic, atraumatic, sclera anicteric. Chest: Clear to auscultation bilaterally.  No wheezes or rhonchi. Cardiovascular: Regular rate and rhythm, no murmurs. Abdomen: soft, nontender.  Normoactive bowel sounds.  No masses or hepatosplenomegaly appreciated.  Umbilical hernia palpated. Extremities: Grossly normal range of motion.  Warm, well perfused.  No edema.   Laboratory & Radiologic Studies: A. VULVA, LEFT, 1:00, BIOPSY:  - High-grade squamous intraepithelial lesion (VIN2-3, high grade  dysplasia)   B. VULVA, LEFT, 11:00, BIOPSY:  - High-grade squamous intraepithelial lesion (VIN2-3, high grade  dysplasia)   C. PERI URETHRA, RIGHT LABIA, BIOPSY:  - High-grade squamous intraepithelial lesion (VIN2-3, high grade  dysplasia)   D. FORCHETTE, POSTERIOR, 5:00, BIOPSY:  - Benign stroma with adnexa  - Negative for dysplasia or malignancy   E. PERI CLITORAL, LEFT, BIOPSY:  - High-grade squamous intraepithelial lesion (VIN2-3, high grade  dysplasia)   Assessment & Plan: Gitty Osterlund is a 81 y.o. woman with history of vulvar cancer, now with high-grade dysplasia.  Discussed treatment options including excision, large excision, and laser  ablation. Patient wished to proceed with laser ablation.   Jeral Pinch, MD  Division of Gynecologic Oncology  Department of Obstetrics and Gynecology  [facility]

## 2021-03-25 NOTE — Anesthesia Preprocedure Evaluation (Signed)
Anesthesia Evaluation  Patient identified by MRN, date of birth, ID band Patient awake    Reviewed: Allergy & Precautions, NPO status , Patient's Chart, lab work & pertinent test results  History of Anesthesia Complications (+) PONV and history of anesthetic complications  Airway Mallampati: II  TM Distance: >3 FB Neck ROM: Full    Dental no notable dental hx. (+) Teeth Intact   Pulmonary asthma , former smoker,    Pulmonary exam normal breath sounds clear to auscultation       Cardiovascular hypertension, Pt. on medications Normal cardiovascular exam Rhythm:Regular Rate:Normal  Hx/o celiac artery aneurysm- stable   Neuro/Psych negative neurological ROS  negative psych ROS   GI/Hepatic negative GI ROS, Neg liver ROS,   Endo/Other  Hypothyroidism Hyperlipidemia  Renal/GU negative Renal ROS  negative genitourinary   Musculoskeletal negative musculoskeletal ROS (+)   Abdominal   Peds  Hematology negative hematology ROS (+)   Anesthesia Other Findings   Reproductive/Obstetrics Hx/o Vulvar Ca Vulvar lesion                             Anesthesia Physical  Anesthesia Plan  ASA: II  Anesthesia Plan: General   Post-op Pain Management:    Induction: Intravenous  PONV Risk Score and Plan: 4 or greater and Treatment may vary due to age or medical condition, Ondansetron, Aprepitant and Amisulpride  Airway Management Planned: LMA  Additional Equipment:   Intra-op Plan:   Post-operative Plan: Extubation in OR  Informed Consent: I have reviewed the patients History and Physical, chart, labs and discussed the procedure including the risks, benefits and alternatives for the proposed anesthesia with the patient or authorized representative who has indicated his/her understanding and acceptance.     Dental advisory given  Plan Discussed with: Anesthesiologist and CRNA  Anesthesia Plan  Comments:         Anesthesia Quick Evaluation

## 2021-03-25 NOTE — Transfer of Care (Signed)
Immediate Anesthesia Transfer of Care Note  Patient: [name]  Procedure(s) Performed: Procedure(s) (LRB): VULVAR BIOPSY (N/A) CO2 LASER APPLICATION TO VULVA (N/A)  Patient Location: PACU  Anesthesia Type: General  Level of Consciousness: awake, oriented, sedated and patient cooperative  Airway & Oxygen Therapy: Patient Spontanous Breathing and Patient connected to face mask oxygen  Post-op Assessment: Report given to PACU RN and Post -op Vital signs reviewed and stable  Post vital signs: Reviewed and stable  Complications: No apparent anesthesia complications Last Vitals:  Vitals Value Taken Time  BP 112/68 03/25/21 1135  Temp 36.4 C 03/25/21 1135  Pulse 62 03/25/21 1145  Resp 13 03/25/21 1145  SpO2 99 % 03/25/21 1145  Vitals shown include unvalidated device data.  Last Pain:  Vitals:   03/25/21 1135  TempSrc:   PainSc: 0-No pain      Patients Stated Pain Goal: 4 (42/87/68 1157)  Complications: No notable events documented.

## 2021-03-25 NOTE — Interval H&P Note (Signed)
History and Physical Interval Note:  03/25/2021 10:26 AM  [name]  has presented today for surgery, with the diagnosis of HIGH GRADE VULVAR DYSPLASIA,HISTORY OF VULVAR CANCER.  The various methods of treatment have been discussed with the patient and family. After consideration of risks, benefits and other options for treatment, the patient has consented to  Procedure(s): VULVAR BIOPSY (N/A) CO2 LASER APPLICATION TO VULVA (N/A) as a surgical intervention.  The patient's history has been reviewed, patient examined, no change in status, stable for surgery.  I have reviewed the patient's chart and labs.  Questions were answered to the patient's satisfaction.     Lafonda Mosses

## 2021-03-25 NOTE — Discharge Instructions (Addendum)
Post Operative Instructions Following Laser Surgery  We recommend purchasing several bags of frozen green peas and dividing them into ziploc bags. You will want to keep these in the freezer and have them ready to use as ice packs to the vulvar incision. Once the ice pack is no longer cold, you can get another from the freezer. The frozen peas mold to your body better than a regular ice pack.   Laser treatment of condyloma (warts) is used to vaporize or eliminate the wart.  The laser actually creates a burn effect on the skin to accomplish this. The following instructions will help in you comfort postoperatively:  First 24 h Sit in a tub or sitz bath of COOL water for 20 minutes 1-3 times a day.  After the bath, carefully blot the area dry and place Silvadene over vulva.  You may sit on a covered ice pack between the baths as needed or on a circular pillow.    If you become uncomfortable, call the office for instructions.  Take your pain medications as prescribed You may eat whatever you feel like.  Start with a light meal and gradually advance your diet.    Beginning the day after your surgery Sit in a tub of cool to warm water for 15 minutes at least 3 times a day and after bowel movements After the bowel movement, clean and dry the area. Apply Silvadene to the vulva after your baths.   Drainage is usually a pink to tan color and is normal for the following 2-3 weeks after surgery. You can use a peripad to help collect any drainage.  Diet Eat a regular diet.  Avoid foods that may constipate you or give you diarrhea.  Avoid foods with seeds, nuts, corn or popcorn. Beginning the day after surgery, drink 6-8 glasses of water a day in addition to your meals.  Limit you caffeine intake to 1-2 servings per day.  Medication Take a fiber supplement (Metamucil, Citrucel, FiberCon) twice a day. Take a stool softener like Colace twice daily Take your pain medication as directed.  You may use Extra  Strength Tylenol instead of your prescribed pain medication to relieve mild discomfort.  Avoid products containing aspirin.  Bowel Habits You should have a bowel movement at least every other day.  If you are constipated, you may take a Fleet enema or 2 Dulcolax tablets.  Call the office if no results occur. You may bear down a normal amount to have a bowel movement without hurting your tissues after the operation.  Activity Walking is encouraged.  You may drive when you are no longer on prescription pain medication.  You may go up and down stairs carefully. No heavy lifting or strenuous activity until after your first post operative appointment Do NOT sit on a rubber ring; instead use a soft pillow if needed.  Call the office if you have any questions or concerns.  Call IMMEDIATELY if you develop: Fever greater than 100 F Difficulty urinating   Post Anesthesia Home Care Instructions  Activity: Get plenty of rest for the remainder of the day. A responsible adult should stay with you for 24 hours following the procedure.  For the next 24 hours, DO NOT: -Drive a car -Paediatric nurse -Drink alcoholic beverages -Take any medication unless instructed by your physician -Make any legal decisions or sign important papers.  Meals: Start with liquid foods such as gelatin or soup. Progress to regular foods as tolerated. Avoid greasy, spicy, heavy  foods. If nausea and/or vomiting occur, drink only clear liquids until the nausea and/or vomiting subsides. Call your physician if vomiting continues.  Special Instructions/Symptoms: Your throat may feel dry or sore from the anesthesia or the breathing tube placed in your throat during surgery. If this causes discomfort, gargle with warm salt water. The discomfort should disappear within 24 hours.  If you had a scopolamine patch placed behind your ear for the management of post- operative nausea and/or vomiting:  1. The medication in the patch is  effective for 72 hours, after which it should be removed.  Wrap patch in a tissue and discard in the trash. Wash hands thoroughly with soap and water. 2. You may remove the patch earlier than 72 hours if you experience unpleasant side effects which may include dry mouth, dizziness or visual disturbances. 3. Avoid touching the patch. Wash your hands with soap and water after contact with the patch.

## 2021-03-25 NOTE — Op Note (Signed)
PATIENT: [name] DATE: 03/25/21  Preop Diagnosis: History of vulvar cancer, lichen sclerosus, now with high grade vulvar dysplasia  Postoperative Diagnosis:   Surgery: CO2 laser of the vulva  Surgeons:  Jeral Pinch MD Assistant: Shea Evans MSIII  Anesthesia: LMA  Estimated blood loss: 10 ml  IVF:  600 cc   Urine output: n/a   Complications: None apparent  Pathology: multiple vulvar, perineal and peri-anal biopsies  Operative findings: On EUA, diffuse areas of thinning tissue with hyperkeratosis, leukoplakia and erythema along superior right vulva, left vulva and peri-clitoral region, small area from prior biopsy site lateral of the urethra on the right. Figure of eight pattern of dry and atrophic skin, loss of architecture, c/w lichen sclerosis. Diffuse small (53mm or less) white plaques along peri-anal area and along bilateral sides of the gluteal cleft. Two of these areas biopsied (perineal and peri-anal). Bilateral superior vulvar lesions were treated with laser as were peri-urethral and small right lateral vulvar lesions.   Procedure: The patient was identified in the preoperative holding area. Informed consent was signed on the chart. Patient was seen history was reviewed and exam was performed.   The patient was then taken to the operating room and placed in the supine position with SCD hose on. General anesthesia was then induced without difficulty. She was then placed in the dorsolithotomy position. The perineum was prepped with Betadine. The vagina was prepped with Betadine. The patient was then draped after the prep was dried.   Timeout was performed the patient, procedure, antibiotic, allergy, and length of procedure. The vulvar tissues were inspected for with changes noted above. The lesions to be treated were identified and the bovie was used to circumscribe the area with appropriate surgical margins. The subcuticular tissues were infiltrated with 1%  lidocaine.   The patient's surgical field was draped with wet towels. The staff and patient ensured laser-safe eyewear and masks were fitted. The laser was set to 8 watts continuous. The laser was tested for accuracy on a tongue depressor.  The laser was applied to the circumscribed area of the vulva that had been previously identified. The tissue was ablated to the desired depth and the eschar was removed with a moistened sponge. When the entire lesions had been ablated the procedure was complete.  Silvadine cream was applied to the laser site.  All instrument, suture, laparotomy, Ray-Tec, and needle counts were correct x2. The patient tolerated the procedure well and was taken recovery room in stable condition.   Jeral Pinch MD Gynecologic Oncology

## 2021-03-25 NOTE — Anesthesia Procedure Notes (Signed)
Procedure Name: LMA Insertion Date/Time: 03/25/2021 11:03 AM Performed by: Suan Halter, CRNA Pre-anesthesia Checklist: Patient identified, Emergency Drugs available, Suction available and Patient being monitored Patient Re-evaluated:Patient Re-evaluated prior to induction Oxygen Delivery Method: Circle system utilized Preoxygenation: Pre-oxygenation with 100% oxygen Induction Type: IV induction Ventilation: Mask ventilation without difficulty LMA: LMA inserted LMA Size: 4.0 Number of attempts: 1 Airway Equipment and Method: Bite block Placement Confirmation: positive ETCO2 Tube secured with: Tape Dental Injury: Teeth and Oropharynx as per pre-operative assessment

## 2021-03-26 ENCOUNTER — Telehealth: Payer: Self-pay

## 2021-03-26 ENCOUNTER — Encounter (HOSPITAL_BASED_OUTPATIENT_CLINIC_OR_DEPARTMENT_OTHER): Payer: Self-pay | Admitting: Gynecologic Oncology

## 2021-03-26 ENCOUNTER — Telehealth: Payer: Self-pay | Admitting: Gynecologic Oncology

## 2021-03-26 DIAGNOSIS — L900 Lichen sclerosus et atrophicus: Secondary | ICD-10-CM

## 2021-03-26 LAB — SURGICAL PATHOLOGY

## 2021-03-26 MED ORDER — CLOBETASOL PROPIONATE 0.05 % EX OINT: 1.0000 "application " | TOPICAL_OINTMENT | Freq: Two times a day (BID) | CUTANEOUS | 0 refills | Status: DC

## 2021-03-26 NOTE — Telephone Encounter (Signed)
Spoke with Ms. Batdorf  this morning. She states she is eating, drinking and urinating well. She has not had a BM yet and is not passing gas. She is taking a stool softener and encouraged her to drink plenty of water. She denies fever or chills. She reports a small amount of drainage from the surgical site which is expected. Advised patient to notify our office if this becomes heavy or she has increased pain. She rates her pain 1/10. She has not needed to take anything for pain but reports she has tylenol if needed.  Patient has been doing baths 3 times daily and applying silvadene cream as prescribed. Patient inquiring when to resume premarin cream. Per Dr. Berline Lopes hold off on premarin for now and continue silvadene use. Patient verbalized understanding.   Instructed to call office with any fever, chills, purulent drainage, uncontrolled pain or any other questions or concerns. Patient verbalizes understanding.   Pt aware of post op appointments as well as the office number [phone] and after hours number [phone] to call if she has any questions or concerns

## 2021-03-26 NOTE — Telephone Encounter (Signed)
Called and spoke with the patient's daughter.  Reviewed biopsy results from surgery yesterday.  Discussed transitioning after several weeks from using the Silvadene to either estrogen only or estrogen alternating with clobetasol.  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  All questions answered.  New prescription for clobetasol sent to the pharmacy.  Jeral Pinch MD Gynecologic Oncology

## 2021-04-21 ENCOUNTER — Other Ambulatory Visit: Payer: Self-pay

## 2021-04-21 ENCOUNTER — Inpatient Hospital Stay: Payer: Medicare Other | Attending: Gynecologic Oncology | Admitting: Gynecologic Oncology

## 2021-04-21 VITALS — BP 128/71 | HR 89 | Temp 97.6°F | Resp 18 | Ht 65.75 in | Wt 154.0 lb

## 2021-04-21 DIAGNOSIS — N903 Dysplasia of vulva, unspecified: Secondary | ICD-10-CM

## 2021-04-21 DIAGNOSIS — T8189XA Other complications of procedures, not elsewhere classified, initial encounter: Secondary | ICD-10-CM

## 2021-04-21 DIAGNOSIS — Z9889 Other specified postprocedural states: Secondary | ICD-10-CM

## 2021-04-21 DIAGNOSIS — C519 Malignant neoplasm of vulva, unspecified: Secondary | ICD-10-CM

## 2021-04-21 DIAGNOSIS — Z7189 Other specified counseling: Secondary | ICD-10-CM

## 2021-04-21 MED ORDER — SILVER SULFADIAZINE 1 % EX CREA: 1.0000 "application " | TOPICAL_CREAM | Freq: Every day | CUTANEOUS | 0 refills | Status: DC

## 2021-04-21 NOTE — Patient Instructions (Signed)
It was good to see you today.  You still have not healed completely from the laser.  I had like you to use the Silvadene cream for 2 weeks (every night), and then the estrogen cream for 2 weeks (3 times a week at night).    I will see you back in 4 weeks.  If things have healed at that point, then we will transition to using a combination of the steroid cream and estrogen.

## 2021-04-21 NOTE — Progress Notes (Signed)
Gynecologic Oncology Return Clinic Visit  04/21/2021  Reason for Visit: Follow-up after recent laser ablation  Treatment History: She had a palpable vulvar lump in the winter 2019 and 2020.  It became more noticeable in April 2020 and she attempted to have an appointment with a gynecologist however she could not get an appointment due to the the coronavirus shot downs.  She was eventually able to see a physician on August 25, 2018 at which time evaluation of the vulva confirmed a vulvar mass.  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 Marti Sleigh.    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 09/27/18: Dr Alycia Rossetti performed a radical anterior vulvectomy.  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  The resection margins were negative for carcinoma with the closest being the lateral margin 1:00 at 0.8 cm.  The carcinoma invaded 1.3 cm depth.  There was no lymphovascular or perineural invasion identified.   Postoperatively a PET/CT was obtained on October 06, 2018.  This revealed postoperative changes to the vulva.  There was no hypermetabolic local regional lymphadenopathy.  There is a questionable subtle focus of hypermetabolism in the left lobe of the liver, indeterminate for liver metastases.  MRI abdomen could be performed for further work-up.  No other potential findings of hypermetabolic distant metastatic disease were seen.   On 10/25/2018, the patient underwent bilateral superficial inguinal lymphadenectomy with fibroadipose tissue noted on the left and no carcinoma seen in 2 of 2 lymph nodes on the right.   The patient was seen in November with vulvar irritation that was intermittent in nature.  At that time she was noted to have changes consistent  with lichen sclerosis or lichen planus.  She was started on triamcinolone twice a day until her symptoms improved and then transition to daily.  I saw her at the end of December and she felt some improvement in her vulvar tenderness but still had significant erythema and irritation of the vulva.  I asked her to add Desitin or similar barrier cream.  When I saw her again in February, she had no improvement and had had significant irritation with the Desitin.  We stopped all topical treatment altogether.   ON 3/25, given persistent skin changes and no improvement in symptoms, vulvar biopsied was performed. Biopsy - high grade dysplasia.   06/15/19: partial simple left vulvectomy, partial simple peri-clitoral vulvectomy, vulvar biopsies, extensive CO2 laser ablation of the vulva. VIN3 noted in two vulvectomy specimens, positive margins of left specimen. Biopsies showed VIN1.    Treatment with vaginal/vulvar estrogen since surgery.   05/09/2020: Patient taken to the operating room for vulvar biopsies, lysis of vaginal agglutination, laser ablation of the vulva.  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  We used estrogen intermittently afterwards.  03/11/2021: Given continued vulvar erythema as well as desquamation, patient underwent EUA with vulvar biopsies and CO2 laser of the vulva.  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  Multiple biopsies showed high-grade vulvar dysplasia including vulvar biopsy at 1:00, 11:00, right periurethral, and left periclitoral.  03/25/2021: Given additional biopsies at her first surgery showing high-grade dysplasia that were not treated with laser ablation, additional CO2 laser of the vulva was recommended and performed.  Biopsies taken at the time of surgery showed focal acute inflammation and reactive atypia in  3 of the biopsies.  Only the right vulva at 10:00 showed VIN 2-3.  Interval History: Patient reports overall doing well after surgery.  Denies any significant  bleeding or discharge.  Has had some irritation when she urinates, slowly improving with time since surgery.  Stop using the Silvadene a couple of weeks ago and has been using both estrogen and clobetasol daily.  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    Past Surgical History:  Procedure Laterality Date   ABDOMINAL HYSTERECTOMY  2008   Pelvic lymphadenectomy   BREAST SURGERY     left breast cyst in areolar area 47 years ago   CO2 LASER APPLICATION N/A 08/11/3327   Procedure: C02 LASER OF VULVAR, VULVAR BIOPSIES,  SIMPLE VULVECTOMY;  Surgeon: Lafonda Mosses, MD;  Location: [place];  Service: Gynecology;  Laterality: N/A;   CO2 LASER APPLICATION N/A 07/08/8839   Procedure: VULVAR  LASER APPLICATION;  Surgeon: Lafonda Mosses, MD;  Location: [facility];  Service: Gynecology;  Laterality: N/A;   CO2 LASER APPLICATION N/A 08/12/628   Procedure: CO2 LASER APPLICATION TO VULVA;  Surgeon: Lafonda Mosses, MD;  Location: [facility];  Service: Gynecology;  Laterality: N/A;   CO2 LASER APPLICATION N/A 1/60/1093   Procedure: CO2 LASER APPLICATION TO VULVA;  Surgeon: Lafonda Mosses, MD;  Location: [facility];  Service: Gynecology;  Laterality: N/A;   LYMPH NODE DISSECTION Bilateral 10/25/2018   Procedure: LYMPH NODE DISSECTION,BILATERAL INGUINOFEMORAL LYMPHADECTOMY;  Surgeon: [name], MD;  Location: [facility];  Service: Gynecology;  Laterality: Bilateral;   RADICAL VULVECTOMY N/A  09/27/2018   Procedure: RADICAL VULVECTOMY;  Surgeon: [name], MD;  Location: [facility];  Service: Gynecology;  Laterality: N/A;   SUPRAPUBIC CATHETER PLACEMENT     with 2008 surgery, later removed   VULVA /PERINEUM BIOPSY N/A 05/09/2020   Procedure: VULVAR BIOPSY;  Surgeon: Lafonda Mosses, MD;  Location: [facility];  Service: Gynecology;  Laterality: N/A;   VULVA Milagros Loll BIOPSY N/A 03/11/2021   Procedure: VULVAR BIOPSY;  Surgeon: Lafonda Mosses, MD;  Location: [facility];  Service: Gynecology;  Laterality: N/A;   VULVA Milagros Loll BIOPSY N/A 03/25/2021   Procedure: VULVAR BIOPSY;  Surgeon: Lafonda Mosses, MD;  Location: [facility];  Service: Gynecology;  Laterality: N/A;    Family History  Problem Relation Age of Onset   Skin cancer Father     Social History   Socioeconomic History   Marital status: Widowed    Spouse name: Not on file   Number of children: Not on file   Years of education: Not on file   Highest education level: Not on file  Occupational History   Not on file  Tobacco Use   Smoking status: Former    Types: Cigarettes    Quit date: 12/08/2003    Years since quitting: 17.3   Smokeless tobacco: Never  Tobacco comments:    social  Vaping Use   Vaping Use: Never used  Substance and Sexual Activity   Alcohol use: No   Drug use: No   Sexual activity: Not Currently  Other Topics Concern   Not on file  Social History Narrative   Not on file   Social Determinants of Health   Financial Resource Strain: Not on file  Food Insecurity: Not on file  Transportation Needs: Not on file  Physical Activity: Not on file  Stress: Not on file  Social Connections: Not on file    Current Medications:  Current Outpatient Medications:    albuterol (VENTOLIN HFA) 108 (90 Base) MCG/ACT inhaler, Inhale 2 puffs into the lungs every 6 (six) hours as needed for wheezing or shortness of breath., Disp: , Rfl:     aspirin 81 MG chewable tablet, Chew 81 mg by mouth daily., Disp: , Rfl:    carboxymethylcellulose (REFRESH PLUS) 0.5 % SOLN, Place 1 drop into the right eye 4 times daily., Disp: , Rfl:    cholecalciferol (VITAMIN D3) 25 MCG (1000 UT) tablet, Take 2,000 Units by mouth every evening., Disp: , Rfl:    clobetasol ointment (TEMOVATE) 2.95 %, Apply 1 application topically 2 (two) times daily., Disp: 30 g, Rfl: 0   conjugated estrogens (PREMARIN) vaginal cream, APPLY DIME SIZE AMOUNT TO THE VULVA EVERY OTHER NIGHT, Disp: 30 g, Rfl: 3   hydrochlorothiazide (HYDRODIURIL) 25 MG tablet, Take 12.5 mg by mouth daily., Disp: , Rfl:    levothyroxine (SYNTHROID) 75 MCG tablet, Take 75 mcg by mouth daily before breakfast., Disp: , Rfl:    Omega-3 Fatty Acids (FISH OIL) 1200 MG CAPS, Take 6,000 mg by mouth every evening. , Disp: , Rfl:    rosuvastatin (CRESTOR) 5 MG tablet, Take 5 mg by mouth at bedtime., Disp: , Rfl:   Review of Systems: Denies appetite changes, fevers, chills, fatigue, unexplained weight changes. Denies hearing loss, neck lumps or masses, mouth sores, ringing in ears or voice changes. Denies cough or wheezing.  Denies shortness of breath. Denies chest pain or palpitations. Denies leg swelling. Denies abdominal distention, pain, blood in stools, constipation, diarrhea, nausea, vomiting, or early satiety. Denies pain with intercourse, dysuria, frequency, hematuria or incontinence. Denies hot flashes, pelvic pain, vaginal bleeding or vaginal discharge.   Denies joint pain, back pain or muscle pain/cramps. Denies itching, rash, or wounds. Denies dizziness, headaches, numbness or seizures. Denies swollen lymph nodes or glands, denies easy bruising or bleeding. Denies anxiety, depression, confusion, or decreased concentration.  Physical Exam: BP 128/71 (BP Location: Left Arm, Patient Position: Sitting)    Pulse 89    Temp 97.6 F (36.4 C) (Oral)    Resp 18    Ht 5' 5.75" (1.67 m)    Wt 154 lb  (69.9 kg)    SpO2 98%    BMI 25.05 kg/m  General: Alert, oriented, no acute distress. HEENT: Cephalic, atraumatic, sclera anicteric. Chest: Unlabored breathing on room air.  GU: External telemetry and notable for areas of erythema as well as healing sites from laser.  Minimal exudate.  Left upper vulvar as well as lateral left vulvar lesions still healing.  Laboratory & Radiologic Studies: 03/25/21: A. VULVA, LEFT, 2 OCLOCK, BIOPSY:  Squamous mucosa with focal acute inflammation and reactive atypia  Negative for dysplasia and diagnostic viral change   B. VULVA, RIGHT, 10 OCLOCK, BIOPSY:  Moderate to severe squamous dysplasia (VIN 2-3)  Marked acute and chronic inflammation with focal surface  erosion   C. PERINEAL, 6 OCLOCK, BIOPSY:  Mild squamous dysplasia (VIN 1)  Acute and chronic inflammation and marked cautery artifact   D. PERINEAL, 9 OCLOCK, BIOPSY:  Squamous mucosa with acute and chronic inflammation and reactive atypia  Negative for dysplasia and diagnostic viral change   Assessment & Plan: Orena Cavazos is a 81 y.o. 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   Patient is overall doing well but still recovering from surgery.  I am afraid that the clobetasol may be hindering the recovery of her vulvar skin.  I have asked her to stop both the estrogen and clobetasol.  I like her to use the Silvadene cream for 2 weeks and then to use estrogen topically 3 times a week at night for 2 weeks.  I will plan to see her back in 4 weeks.  At that time, if her tissue is healed from surgery, we will transition to intermittent topical steroid use.  28 minutes of total time was spent for this patient encounter, including preparation, face-to-face counseling with the patient and coordination of care,  and documentation of the encounter.  Jeral Pinch, MD  Division of Gynecologic Oncology  Department of Obstetrics and Gynecology  [facility]

## 2021-04-25 ENCOUNTER — Other Ambulatory Visit: Payer: Self-pay

## 2021-05-16 ENCOUNTER — Encounter: Payer: Self-pay | Admitting: Gynecologic Oncology

## 2021-05-19 ENCOUNTER — Inpatient Hospital Stay: Payer: Medicare Other | Attending: Gynecologic Oncology | Admitting: Gynecologic Oncology

## 2021-05-19 ENCOUNTER — Other Ambulatory Visit: Payer: Self-pay

## 2021-05-19 ENCOUNTER — Encounter: Payer: Self-pay | Admitting: Gynecologic Oncology

## 2021-05-19 VITALS — BP 126/58 | HR 73 | Temp 97.9°F | Resp 16 | Ht 65.75 in | Wt 157.8 lb

## 2021-05-19 DIAGNOSIS — Z9079 Acquired absence of other genital organ(s): Secondary | ICD-10-CM | POA: Diagnosis not present

## 2021-05-19 DIAGNOSIS — Z79890 Hormone replacement therapy (postmenopausal): Secondary | ICD-10-CM | POA: Diagnosis not present

## 2021-05-19 DIAGNOSIS — L900 Lichen sclerosus et atrophicus: Secondary | ICD-10-CM | POA: Diagnosis not present

## 2021-05-19 DIAGNOSIS — Z8544 Personal history of malignant neoplasm of other female genital organs: Secondary | ICD-10-CM | POA: Diagnosis not present

## 2021-05-19 DIAGNOSIS — N903 Dysplasia of vulva, unspecified: Secondary | ICD-10-CM | POA: Diagnosis not present

## 2021-05-19 DIAGNOSIS — C519 Malignant neoplasm of vulva, unspecified: Secondary | ICD-10-CM

## 2021-05-19 NOTE — Progress Notes (Signed)
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  It became more noticeable in April 2020 and she attempted to have an appointment with a gynecologist however she could not get an appointment due to the the coronavirus shot downs.  She was eventually able to see a physician on August 25, 2018 at which time evaluation of the vulva confirmed a vulvar mass.  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 Marti Sleigh.    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 09/27/18: Dr Alycia Rossetti performed a radical anterior vulvectomy.  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  The resection margins were negative for carcinoma with the closest being the lateral margin 1:00 at 0.8 cm.  The carcinoma invaded 1.3 cm depth.  There was no lymphovascular or perineural invasion identified.   Postoperatively a PET/CT was obtained on October 06, 2018.  This revealed postoperative changes to the vulva.  There was no hypermetabolic local regional lymphadenopathy.  There is a questionable subtle focus of hypermetabolism in the left lobe of the liver, indeterminate for liver metastases.  MRI abdomen could be performed for further work-up.  No other potential findings of hypermetabolic distant metastatic disease were seen.   On 10/25/2018, the patient underwent bilateral superficial inguinal lymphadenectomy with fibroadipose tissue noted on the left and no carcinoma seen in 2 of 2 lymph nodes on the right.   The patient was seen in  November with vulvar irritation that was intermittent in nature.  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  She was started on triamcinolone twice a day until her symptoms improved and then transition to daily.  I saw her at the end of December and she felt some improvement in her vulvar tenderness but still had significant erythema and irritation of the vulva.  I asked her to add Desitin or similar barrier cream.  When I saw her again in February, she had no improvement and had had significant irritation with the Desitin.  We stopped all topical treatment altogether.   ON 3/25, given persistent skin changes and no improvement in symptoms, vulvar biopsied was performed. Biopsy - high grade dysplasia.   06/15/19: partial simple left vulvectomy, partial simple peri-clitoral vulvectomy, vulvar biopsies, extensive CO2 laser ablation of the vulva. VIN3 noted in two vulvectomy specimens, positive margins of left specimen. Biopsies showed VIN1.    Treatment with vaginal/vulvar estrogen since surgery.   05/09/2020: Patient taken to the operating room for vulvar biopsies, lysis of vaginal agglutination, laser ablation of the vulva.  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   We used estrogen intermittently afterwards.   03/11/2021: Given continued vulvar erythema as well as desquamation, patient underwent EUA with vulvar biopsies and CO2 laser of the vulva.  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  Multiple biopsies showed high-grade vulvar dysplasia including vulvar biopsy at 1:00, 11:00, right periurethral, and left periclitoral.   03/25/2021: Given additional biopsies at her first surgery showing high-grade dysplasia that were not treated with laser ablation, additional CO2 laser  of the vulva was recommended and performed.  Biopsies taken at the time of surgery showed focal acute inflammation and reactive atypia in 3 of the biopsies.  Only the right vulva at  10:00 showed VIN 2-3.  Interval History: Used Silvadene cream for 2 weeks and has been using estrogen cream for the last 2 weeks.  Denies any symptoms including vulvar irritation, burning, pruritus, pain, bleeding or discharge.  Reports regular bowel and bladder function.  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    Past Surgical History:  Procedure Laterality Date   ABDOMINAL HYSTERECTOMY  2008   Pelvic lymphadenectomy   BREAST SURGERY     left breast cyst in areolar area 46 years ago   CO2 LASER APPLICATION N/A 07/12/2128   Procedure: C02 LASER OF VULVAR, VULVAR BIOPSIES,  SIMPLE VULVECTOMY;  Surgeon: Lafonda Mosses, MD;  Location: [place];  Service: Gynecology;  Laterality: N/A;   CO2 LASER APPLICATION N/A 10/13/5782   Procedure: VULVAR  LASER APPLICATION;  Surgeon: Lafonda Mosses, MD;  Location: [facility];  Service: Gynecology;  Laterality: N/A;   CO2 LASER APPLICATION N/A 08/15/6293   Procedure: CO2 LASER APPLICATION TO VULVA;  Surgeon: Lafonda Mosses, MD;  Location: [facility];  Service: Gynecology;  Laterality: N/A;   CO2 LASER APPLICATION N/A 2/84/1324   Procedure: CO2 LASER APPLICATION TO VULVA;  Surgeon: Lafonda Mosses, MD;  Location: [facility];  Service: Gynecology;  Laterality: N/A;   LYMPH NODE DISSECTION Bilateral 10/25/2018   Procedure: LYMPH NODE DISSECTION,BILATERAL INGUINOFEMORAL LYMPHADECTOMY;  Surgeon: [name], MD;  Location: [facility];  Service:  Gynecology;  Laterality: Bilateral;   RADICAL VULVECTOMY N/A 09/27/2018   Procedure: RADICAL VULVECTOMY;  Surgeon: [name], MD;  Location: [facility];  Service: Gynecology;  Laterality: N/A;   SUPRAPUBIC CATHETER PLACEMENT     with 2008 surgery, later removed   VULVA /PERINEUM BIOPSY N/A 05/09/2020   Procedure: VULVAR BIOPSY;  Surgeon: Lafonda Mosses, MD;  Location: [facility];  Service: Gynecology;  Laterality: N/A;   VULVA Milagros Loll BIOPSY N/A 03/11/2021   Procedure: VULVAR BIOPSY;  Surgeon: Lafonda Mosses, MD;  Location: [facility];  Service: Gynecology;  Laterality: N/A;   VULVA Milagros Loll BIOPSY N/A 03/25/2021   Procedure: VULVAR BIOPSY;  Surgeon: Lafonda Mosses, MD;  Location: [facility];  Service: Gynecology;  Laterality: N/A;    Family History  Problem Relation Age of Onset   Skin cancer Father     Social History   Socioeconomic History   Marital status: Widowed    Spouse name: Not on file   Number of children: Not on file   Years of education: Not on file   Highest education level: Not on file  Occupational History   Not on file  Tobacco Use   Smoking status: Former    Types: Cigarettes    Quit date: 12/08/2003  Years since quitting: 17.4   Smokeless tobacco: Never   Tobacco comments:    social  Vaping Use   Vaping Use: Never used  Substance and Sexual Activity   Alcohol use: No   Drug use: No   Sexual activity: Not Currently  Other Topics Concern   Not on file  Social History Narrative   Not on file   Social Determinants of Health   Financial Resource Strain: Not on file  Food Insecurity: Not on file  Transportation Needs: Not on file  Physical Activity: Not on file  Stress: Not on file  Social Connections: Not on file    Current Medications:  Current Outpatient Medications:    aspirin 81 MG chewable tablet, Chew 81 mg by mouth daily., Disp: , Rfl:    carboxymethylcellulose (REFRESH PLUS)  0.5 % SOLN, Place 1 drop into the right eye 4 times daily., Disp: , Rfl:    cholecalciferol (VITAMIN D3) 25 MCG (1000 UT) tablet, Take 2,000 Units by mouth every evening., Disp: , Rfl:    clobetasol ointment (TEMOVATE) 7.74 %, Apply 1 application topically 2 (two) times daily., Disp: 30 g, Rfl: 0   conjugated estrogens (PREMARIN) vaginal cream, APPLY DIME SIZE AMOUNT TO THE VULVA EVERY OTHER NIGHT, Disp: 30 g, Rfl: 3   hydrochlorothiazide (HYDRODIURIL) 25 MG tablet, Take 12.5 mg by mouth daily., Disp: , Rfl:    levothyroxine (SYNTHROID) 75 MCG tablet, Take 75 mcg by mouth daily before breakfast., Disp: , Rfl:    Omega-3 Fatty Acids (FISH OIL) 1200 MG CAPS, Take 6,000 mg by mouth every evening. , Disp: , Rfl:    rosuvastatin (CRESTOR) 5 MG tablet, Take 5 mg by mouth at bedtime., Disp: , Rfl:    albuterol (VENTOLIN HFA) 108 (90 Base) MCG/ACT inhaler, Inhale 2 puffs into the lungs every 6 (six) hours as needed for wheezing or shortness of breath. (Patient not taking: Reported on 05/16/2021), Disp: , Rfl:    silver sulfADIAZINE (SILVADENE) 1 % cream, Apply 1 application topically at bedtime. Apply to vulva every night for 2 weeks (Patient not taking: Reported on 05/16/2021), Disp: 50 g, Rfl: 0  Review of Systems: Denies appetite changes, fevers, chills, fatigue, unexplained weight changes. Denies hearing loss, neck lumps or masses, mouth sores, ringing in ears or voice changes. Denies cough or wheezing.  Denies shortness of breath. Denies chest pain or palpitations. Denies leg swelling. Denies abdominal distention, pain, blood in stools, constipation, diarrhea, nausea, vomiting, or early satiety. Denies pain with intercourse, dysuria, frequency, hematuria or incontinence. Denies hot flashes, pelvic pain, vaginal bleeding or vaginal discharge.   Denies joint pain, back pain or muscle pain/cramps. Denies itching, rash, or wounds. Denies dizziness, headaches, numbness or seizures. Denies swollen lymph  nodes or glands, denies easy bruising or bleeding. Denies anxiety, depression, confusion, or decreased concentration.  Physical Exam: BP (!) 126/58 (BP Location: Left Arm, Patient Position: Sitting)    Pulse 73    Temp 97.9 F (36.6 C) (Oral)    Resp 16    Ht 5' 5.75" (1.67 m)    Wt 157 lb 12.8 oz (71.6 kg)    SpO2 98%    BMI 25.67 kg/m  General: Alert, oriented, no acute distress. HEENT: Normocephalic, atraumatic, sclera anicteric. Chest: Unlabored breathing on room air.  Extremities: Grossly normal range of motion.  Warm, well perfused.  No edema bilaterally. Lymphatics: No cervical, supraclavicular, or inguinal adenopathy. GU: Picture taken in media.  Some desquamation still of bilateral labia, overall  improved from last visit.  No erythema or exudate.  No atypical vasculature.  Laboratory & Radiologic Studies: None new  Assessment & Plan: [name] is a 81 y.o. 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   Patient has done very well over the last 4 weeks.  She is completely asymptomatic now for the first time in quite a while.  There are still some findings of lichen sclerosis and desquamation of the vulvar tissue, but otherwise she has healed well since her last visit.  We discussed today transitioning to intermittent clobetasol use while we continue with estrogen.  We will have her do estrogen for another month and then a 2-week course of clobetasol followed by 4 weeks of estrogen, alternating back-and-forth.  I will see her back for follow-up in 3 months.  I encouraged her to call if she develops any new symptoms before then.  28 minutes of total time was spent for this patient encounter, including preparation, face-to-face counseling with the patient and coordination of care, and  documentation of the encounter.  Jeral Pinch, MD  Division of Gynecologic Oncology  Department of Obstetrics and Gynecology  [facility]

## 2021-05-19 NOTE — Patient Instructions (Signed)
It was good to see you today.  I am glad that you are symptoms have improved. ? ?I would like you to use the estrogen at night for another month.  We will then add in the steroid cream.  The plan will be to have you use the steroid cream for 2 weeks at night and then the estrogen for the next 4 weeks, alternating back-and-forth. ?

## 2021-08-21 ENCOUNTER — Encounter: Payer: Self-pay | Admitting: Gynecologic Oncology

## 2021-08-22 ENCOUNTER — Other Ambulatory Visit: Payer: Self-pay

## 2021-08-22 ENCOUNTER — Encounter: Payer: Self-pay | Admitting: Gynecologic Oncology

## 2021-08-22 ENCOUNTER — Inpatient Hospital Stay: Payer: Medicare Other | Attending: Gynecologic Oncology | Admitting: Gynecologic Oncology

## 2021-08-22 VITALS — BP 123/70 | HR 77 | Temp 98.1°F | Resp 18 | Ht 65.0 in | Wt 156.8 lb

## 2021-08-22 DIAGNOSIS — L904 Acrodermatitis chronica atrophicans: Secondary | ICD-10-CM

## 2021-08-22 DIAGNOSIS — Z8544 Personal history of malignant neoplasm of other female genital organs: Secondary | ICD-10-CM | POA: Diagnosis not present

## 2021-08-22 DIAGNOSIS — Z8541 Personal history of malignant neoplasm of cervix uteri: Secondary | ICD-10-CM | POA: Diagnosis not present

## 2021-08-22 DIAGNOSIS — Z9079 Acquired absence of other genital organ(s): Secondary | ICD-10-CM | POA: Insufficient documentation

## 2021-08-22 DIAGNOSIS — Z90710 Acquired absence of both cervix and uterus: Secondary | ICD-10-CM | POA: Insufficient documentation

## 2021-08-22 DIAGNOSIS — N904 Leukoplakia of vulva: Secondary | ICD-10-CM | POA: Insufficient documentation

## 2021-08-22 DIAGNOSIS — L900 Lichen sclerosus et atrophicus: Secondary | ICD-10-CM

## 2021-08-22 DIAGNOSIS — C519 Malignant neoplasm of vulva, unspecified: Secondary | ICD-10-CM

## 2021-08-22 DIAGNOSIS — Z87412 Personal history of vulvar dysplasia: Secondary | ICD-10-CM | POA: Diagnosis present

## 2021-08-22 MED ORDER — CLOBETASOL PROPIONATE 0.05 % EX OINT: 1.0000 | TOPICAL_OINTMENT | Freq: Two times a day (BID) | CUTANEOUS | 0 refills | Status: DC

## 2021-08-22 NOTE — Patient Instructions (Signed)
It was good to see you today.  Your exam is stable compared to last time.  I do not see any concerning areas for precancer.  Like you to continue doing the regimen that you have been alternating between estrogen and clobetasol (see calendar).  I will see you back in 3 months.  Please call if you develop any new symptoms before then.

## 2021-08-22 NOTE — Progress Notes (Signed)
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  It became more noticeable in April 2020 and she attempted to have an appointment with a gynecologist however she could not get an appointment due to the the coronavirus shot downs.  She was eventually able to see a physician on August 25, 2018 at which time evaluation of the vulva confirmed a vulvar mass.  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 Marti Sleigh.    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 09/27/18: Dr Alycia Rossetti performed a radical anterior vulvectomy.  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  The resection margins were negative for carcinoma with the closest being the lateral margin 1:00 at 0.8 cm.  The carcinoma invaded 1.3 cm depth.  There was no lymphovascular or perineural invasion identified.   Postoperatively a PET/CT was obtained on October 06, 2018.  This revealed postoperative changes to the vulva.  There was no hypermetabolic local regional lymphadenopathy.  There is a questionable subtle focus of hypermetabolism in the left lobe of the liver, indeterminate for liver metastases.  MRI abdomen could be performed for further work-up.  No other potential findings of hypermetabolic distant metastatic disease were seen.   On 10/25/2018, the patient underwent bilateral superficial inguinal lymphadenectomy with fibroadipose tissue noted on the left and no carcinoma seen in 2 of 2 lymph nodes on the right.   The patient was seen in  November with vulvar irritation that was intermittent in nature.  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  She was started on triamcinolone twice a day until her symptoms improved and then transition to daily.  I saw her at the end of December and she felt some improvement in her vulvar tenderness but still had significant erythema and irritation of the vulva.  I asked her to add Desitin or similar barrier cream.  When I saw her again in February, she had no improvement and had had significant irritation with the Desitin.  We stopped all topical treatment altogether.   ON 3/25, given persistent skin changes and no improvement in symptoms, vulvar biopsied was performed. Biopsy - high grade dysplasia.   06/15/19: partial simple left vulvectomy, partial simple peri-clitoral vulvectomy, vulvar biopsies, extensive CO2 laser ablation of the vulva. VIN3 noted in two vulvectomy specimens, positive margins of left specimen. Biopsies showed VIN1.    Treatment with vaginal/vulvar estrogen since surgery.   05/09/2020: Patient taken to the operating room for vulvar biopsies, lysis of vaginal agglutination, laser ablation of the vulva.  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   We used estrogen intermittently afterwards.   03/11/2021: Given continued vulvar erythema as well as desquamation, patient underwent EUA with vulvar biopsies and CO2 laser of the vulva.  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  Multiple biopsies showed high-grade vulvar dysplasia including vulvar biopsy at 1:00, 11:00, right periurethral, and left periclitoral.   03/25/2021: Given additional biopsies at her first surgery showing high-grade dysplasia that were not treated with laser ablation, additional CO2 laser  of the vulva was recommended and performed.  Biopsies taken at the time of surgery showed focal acute inflammation and reactive atypia in 3 of the biopsies.  Only the right vulva at  10:00 showed VIN 2-3.  Interval History: Patient reports doing well.  She has had occasional pruritus and occasional burning, otherwise denies vulvar symptoms including pain, bleeding, or discharge.  Has been using the topical estrogen and clobetasol alternating as we had previously discussed.  Reports regular bowel and bladder function.  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    Past Surgical History:  Procedure Laterality Date   ABDOMINAL HYSTERECTOMY  2008   Pelvic lymphadenectomy   BREAST SURGERY     left breast cyst in areolar area 46 years ago   CO2 LASER APPLICATION N/A 0/05/5007   Procedure: C02 LASER OF VULVAR, VULVAR BIOPSIES,  SIMPLE VULVECTOMY;  Surgeon: Lafonda Mosses, MD;  Location: [place];  Service: Gynecology;  Laterality: N/A;   CO2 LASER APPLICATION N/A 05/15/1827   Procedure: VULVAR  LASER APPLICATION;  Surgeon: Lafonda Mosses, MD;  Location: [facility];  Service: Gynecology;  Laterality: N/A;   CO2 LASER APPLICATION N/A 11/10/7167   Procedure: CO2 LASER APPLICATION TO VULVA;  Surgeon: Lafonda Mosses, MD;  Location: [facility];  Service: Gynecology;  Laterality: N/A;   CO2 LASER APPLICATION N/A 6/78/9381   Procedure: CO2 LASER APPLICATION TO VULVA;  Surgeon: Lafonda Mosses, MD;  Location: [facility];  Service: Gynecology;  Laterality: N/A;   LYMPH NODE DISSECTION Bilateral 10/25/2018   Procedure: LYMPH NODE DISSECTION,BILATERAL INGUINOFEMORAL  LYMPHADECTOMY;  Surgeon: [name], MD;  Location: [facility];  Service: Gynecology;  Laterality: Bilateral;   RADICAL VULVECTOMY N/A 09/27/2018   Procedure: RADICAL VULVECTOMY;  Surgeon: [name], MD;  Location: [facility];  Service: Gynecology;  Laterality: N/A;   SUPRAPUBIC CATHETER PLACEMENT     with 2008 surgery, later removed   VULVA /PERINEUM BIOPSY N/A 05/09/2020   Procedure: VULVAR BIOPSY;  Surgeon: Lafonda Mosses, MD;  Location: [facility];  Service: Gynecology;  Laterality: N/A;   VULVA Milagros Loll BIOPSY N/A 03/11/2021   Procedure: VULVAR BIOPSY;  Surgeon: Lafonda Mosses, MD;  Location[facility];  Service: Gynecology;  Laterality: N/A;   VULVA Milagros Loll BIOPSY N/A 03/25/2021   Procedure: VULVAR BIOPSY;  Surgeon: Lafonda Mosses, MD;  Location: [facility];  Service: Gynecology;  Laterality: N/A;    Family History  Problem Relation Age of Onset   Skin cancer Father     Social History   Socioeconomic History   Marital status: Widowed    Spouse name: Not on file   Number of children: Not on file   Years of education: Not on file   Highest education level: Not on file  Occupational History   Not on file  Tobacco Use   Smoking status: Former    Types:  Cigarettes    Quit date: 12/08/2003    Years since quitting: 17.7   Smokeless tobacco: Never   Tobacco comments:    social  Vaping Use   Vaping Use: Never used  Substance and Sexual Activity   Alcohol use: No   Drug use: No   Sexual activity: Not Currently  Other Topics Concern   Not on file  Social History Narrative   Not on file   Social Determinants of Health   Financial Resource Strain: Not on file  Food Insecurity: Not on file  Transportation Needs: Not on file  Physical Activity: Not on file  Stress: Not on file  Social Connections: Not on file    Current Medications:  Current Outpatient Medications:    aspirin 81 MG chewable tablet, Chew 81  mg by mouth daily., Disp: , Rfl:    Carboxymethylcellulose Sod PF 1 % GEL, Apply to eye., Disp: , Rfl:    cholecalciferol (VITAMIN D3) 25 MCG (1000 UT) tablet, Take 2,000 Units by mouth every evening., Disp: , Rfl:    clobetasol ointment (TEMOVATE) 5.10 %, Apply 1 Application topically 2 (two) times daily. As instructed by your provider, Disp: 30 g, Rfl: 0   conjugated estrogens (PREMARIN) vaginal cream, APPLY DIME SIZE AMOUNT TO THE VULVA EVERY OTHER NIGHT, Disp: 30 g, Rfl: 3   hydrochlorothiazide (HYDRODIURIL) 25 MG tablet, Take 12.5 mg by mouth daily., Disp: , Rfl:    levothyroxine (SYNTHROID) 75 MCG tablet, Take 75 mcg by mouth daily before breakfast., Disp: , Rfl:    Omega-3 Fatty Acids (FISH OIL) 1200 MG CAPS, Take 6,000 mg by mouth every evening. , Disp: , Rfl:    rosuvastatin (CRESTOR) 5 MG tablet, Take 5 mg by mouth at bedtime., Disp: , Rfl:   Review of Systems: Denies appetite changes, fevers, chills, fatigue, unexplained weight changes. Denies hearing loss, neck lumps or masses, mouth sores, ringing in ears or voice changes. Denies cough or wheezing.  Denies shortness of breath. Denies chest pain or palpitations. Denies leg swelling. Denies abdominal distention, pain, blood in stools, constipation, diarrhea, nausea, vomiting, or early satiety. Denies pain with intercourse, dysuria, frequency, hematuria or incontinence. Denies hot flashes, pelvic pain, vaginal bleeding or vaginal discharge.   Denies joint pain, back pain or muscle pain/cramps. Denies itching, rash, or wounds. Denies dizziness, headaches, numbness or seizures. Denies swollen lymph nodes or glands, denies easy bruising or bleeding. Denies anxiety, depression, confusion, or decreased concentration.  Physical Exam: BP 123/70 (BP Location: Left Arm, Patient Position: Sitting)   Pulse 77   Temp 98.1 F (36.7 C) (Oral)   Resp 18   Ht '5\' 5"'$  (1.651 m)   Wt 156 lb 12.8 oz (71.1 kg)   SpO2 96%   BMI 26.09 kg/m   General: Alert, oriented, no acute distress. HEENT: Normocephalic, atraumatic, sclera anicteric. Chest: Unlabored breathing on room air.  GU: Picture taken in media.  Some desquamation still of bilateral labia, overall stable from last visit.  No erythema or exudate.  No atypical vasculature.     Laboratory & Radiologic Studies: None new  Assessment & Plan: Novi Calia is a 81 y.o. 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  Last procedure on 03/25/2021 included exam under anesthesia, vulvar biopsies, and vulvar laser ablation of dysplasia.   Patient has done well on alternating regimen of clobetasol and topical estrogen.  She remains relatively asymptomatic with no  significant change to her exam.  Pictures taken again in media today with the patient's permission.  We will continue on her current regimen alternating between 4 weeks of estrogen followed by 2 weeks of clobetasol and then back to estrogen.  I will see the patient for follow-up in 3 months.  We reviewed signs and symptoms that should prompt a phone call prior to her neck scheduled visit.  22 minutes of total time was spent for this patient encounter, including preparation, face-to-face counseling with the patient and coordination of care, and documentation of the encounter.  Jeral Pinch, MD  Division of Gynecologic Oncology  Department of Obstetrics and Gynecology  [facility]

## 2021-11-21 ENCOUNTER — Other Ambulatory Visit: Payer: Self-pay

## 2021-11-21 ENCOUNTER — Inpatient Hospital Stay: Payer: Medicare Other | Attending: Gynecologic Oncology | Admitting: Gynecologic Oncology

## 2021-11-21 ENCOUNTER — Encounter: Payer: Self-pay | Admitting: Gynecologic Oncology

## 2021-11-21 VITALS — BP 113/69 | HR 69 | Temp 98.4°F | Resp 18 | Ht 65.0 in | Wt 167.0 lb

## 2021-11-21 DIAGNOSIS — Z8541 Personal history of malignant neoplasm of cervix uteri: Secondary | ICD-10-CM | POA: Diagnosis not present

## 2021-11-21 DIAGNOSIS — Z79899 Other long term (current) drug therapy: Secondary | ICD-10-CM | POA: Diagnosis not present

## 2021-11-21 DIAGNOSIS — Z8544 Personal history of malignant neoplasm of other female genital organs: Secondary | ICD-10-CM | POA: Insufficient documentation

## 2021-11-21 DIAGNOSIS — D071 Carcinoma in situ of vulva: Secondary | ICD-10-CM | POA: Insufficient documentation

## 2021-11-21 DIAGNOSIS — Z87891 Personal history of nicotine dependence: Secondary | ICD-10-CM | POA: Diagnosis not present

## 2021-11-21 DIAGNOSIS — Z90710 Acquired absence of both cervix and uterus: Secondary | ICD-10-CM | POA: Diagnosis not present

## 2021-11-21 DIAGNOSIS — C519 Malignant neoplasm of vulva, unspecified: Secondary | ICD-10-CM

## 2021-11-21 NOTE — Patient Instructions (Signed)
It was good to see you today.  I do not see or feel any evidence of cancer recurrence on your exam.  I will see you for follow-up in 3 months.  As always, if you develop any new and concerning symptoms before your next visit, please call to see me sooner.  

## 2021-11-21 NOTE — Progress Notes (Signed)
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  It became more noticeable in April 2020 and she attempted to have an appointment with a gynecologist however she could not get an appointment due to the the coronavirus shot downs.  She was eventually able to see a physician on August 25, 2018 at which time evaluation of the vulva confirmed a vulvar mass.  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 Marti Sleigh.    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 09/27/18: Dr Alycia Rossetti performed a radical anterior vulvectomy.  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  The resection margins were negative for carcinoma with the closest being the lateral margin 1:00 at 0.8 cm.  The carcinoma invaded 1.3 cm depth.  There was no lymphovascular or perineural invasion identified.   Postoperatively a PET/CT was obtained on October 06, 2018.  This revealed postoperative changes to the vulva.  There was no hypermetabolic local regional lymphadenopathy.  There is a questionable subtle focus of hypermetabolism in the left lobe of the liver, indeterminate for liver metastases.  MRI abdomen could be performed for further work-up.  No other potential findings of hypermetabolic distant metastatic disease were seen.   On 10/25/2018, the patient underwent bilateral superficial inguinal lymphadenectomy with fibroadipose tissue noted on the left and no carcinoma seen in 2 of 2 lymph nodes on the right.   The patient was seen in  November with vulvar irritation that was intermittent in nature.  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  She was started on triamcinolone twice a day until her symptoms improved and then transition to daily.  I saw her at the end of December and she felt some improvement in her vulvar tenderness but still had significant erythema and irritation of the vulva.  I asked her to add Desitin or similar barrier cream.  When I saw her again in February, she had no improvement and had had significant irritation with the Desitin.  We stopped all topical treatment altogether.   ON 3/25, given persistent skin changes and no improvement in symptoms, vulvar biopsied was performed. Biopsy - high grade dysplasia.   06/15/19: partial simple left vulvectomy, partial simple peri-clitoral vulvectomy, vulvar biopsies, extensive CO2 laser ablation of the vulva. VIN3 noted in two vulvectomy specimens, positive margins of left specimen. Biopsies showed VIN1.    Treatment with vaginal/vulvar estrogen since surgery.   05/09/2020: Patient taken to the operating room for vulvar biopsies, lysis of vaginal agglutination, laser ablation of the vulva.  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   We used estrogen intermittently afterwards.   03/11/2021: Given continued vulvar erythema as well as desquamation, patient underwent EUA with vulvar biopsies and CO2 laser of the vulva.  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  Multiple biopsies showed high-grade vulvar dysplasia including vulvar biopsy at 1:00, 11:00, right periurethral, and left periclitoral.   03/25/2021: Given additional biopsies at her first surgery showing high-grade dysplasia that were not treated with laser ablation, additional CO2 laser  of the vulva was recommended and performed.  Biopsies taken at the time of surgery showed focal acute inflammation and reactive atypia in 3 of the biopsies.  Only the right vulva at  10:00 showed VIN 2-3.  Interval History: Doing well.  Continues on the alternating estrogen and clobetasol schedule.  Has intermittent vulvar pruritus and some irritation when she urinates.  Symptoms are not constant or daily.  Denies any pain, bleeding, or discharge.  Reports baseline bowel and bladder function.  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    Past Surgical History:  Procedure Laterality Date   ABDOMINAL HYSTERECTOMY  2008   Pelvic lymphadenectomy   BREAST SURGERY     left breast cyst in areolar area 46 years ago   CO2 LASER APPLICATION N/A 09/14/3901   Procedure: C02 LASER OF VULVAR, VULVAR BIOPSIES,  SIMPLE VULVECTOMY;  Surgeon: Lafonda Mosses, MD;  Location: [place];  Service: Gynecology;  Laterality: N/A;   CO2 LASER APPLICATION N/A 0/0/9233   Procedure: VULVAR  LASER APPLICATION;  Surgeon: Lafonda Mosses, MD;  Location: [facility];  Service: Gynecology;  Laterality: N/A;   CO2 LASER APPLICATION N/A 0/0/7622   Procedure: CO2 LASER APPLICATION TO VULVA;  Surgeon: Lafonda Mosses, MD;  Location: [facility];  Service: Gynecology;  Laterality: N/A;   CO2 LASER APPLICATION N/A 6/33/3545   Procedure: CO2 LASER APPLICATION TO VULVA;  Surgeon: Lafonda Mosses, MD;  Location: [facility];  Service: Gynecology;  Laterality: N/A;   LYMPH NODE DISSECTION Bilateral 10/25/2018   Procedure: LYMPH NODE DISSECTION,BILATERAL INGUINOFEMORAL LYMPHADECTOMY;  Surgeon: [name], MD;  Location: [facility];  Service: Gynecology;  Laterality: Bilateral;   RADICAL VULVECTOMY N/A 09/27/2018   Procedure: RADICAL VULVECTOMY;  Surgeon: [name], MD;  Location: [facility];  Service: Gynecology;  Laterality: N/A;   SUPRAPUBIC CATHETER PLACEMENT     with 2008 surgery, later removed   VULVA /PERINEUM BIOPSY N/A 05/09/2020   Procedure: VULVAR BIOPSY;  Surgeon: Lafonda Mosses, MD;  Location: [facility];  Service: Gynecology;  Laterality: N/A;   VULVA Milagros Loll BIOPSY N/A 03/11/2021   Procedure: VULVAR BIOPSY;  Surgeon: Lafonda Mosses, MD;  Location: [facility];  Service: Gynecology;  Laterality: N/A;   VULVA Milagros Loll BIOPSY N/A 03/25/2021   Procedure: VULVAR BIOPSY;  Surgeon: Lafonda Mosses, MD;  Location: [facility];  Service: Gynecology;  Laterality: N/A;    Family History  Problem Relation Age of Onset   Skin cancer Father     Social History   Socioeconomic History   Marital status: Widowed    Spouse name: Not on file   Number of children: Not on file   Years of education: Not on file   Highest education level: Not on file  Occupational History   Not on file  Tobacco Use   Smoking status: Former    Types: Cigarettes  Quit date: 12/08/2003    Years since quitting: 17.9   Smokeless tobacco: Never   Tobacco comments:    social  Vaping Use   Vaping Use: Never used  Substance and Sexual Activity   Alcohol use: No   Drug use: No   Sexual activity: Not Currently  Other Topics Concern   Not on file  Social History Narrative   Not on file   Social Determinants of Health   Financial Resource Strain: Not on file  Food Insecurity: Not on file  Transportation Needs: Not on file  Physical Activity: Not on file  Stress: Not on file  Social Connections: Not on file    Current Medications:  Current Outpatient Medications:    aspirin 81 MG chewable tablet, Chew 81 mg by mouth daily., Disp: , Rfl:     Carboxymethylcellulose Sod PF 1 % GEL, Apply to eye., Disp: , Rfl:    cholecalciferol (VITAMIN D3) 25 MCG (1000 UT) tablet, Take 2,000 Units by mouth every evening., Disp: , Rfl:    clobetasol ointment (TEMOVATE) 4.74 %, Apply 1 Application topically 2 (two) times daily. As instructed by your provider, Disp: 30 g, Rfl: 0   conjugated estrogens (PREMARIN) vaginal cream, APPLY DIME SIZE AMOUNT TO THE VULVA EVERY OTHER NIGHT, Disp: 30 g, Rfl: 3   hydrochlorothiazide (HYDRODIURIL) 25 MG tablet, Take 12.5 mg by mouth daily., Disp: , Rfl:    levothyroxine (SYNTHROID) 75 MCG tablet, Take 75 mcg by mouth daily before breakfast., Disp: , Rfl:    Omega-3 Fatty Acids (FISH OIL) 1200 MG CAPS, Take 6,000 mg by mouth every evening. , Disp: , Rfl:    rosuvastatin (CRESTOR) 5 MG tablet, Take 5 mg by mouth at bedtime., Disp: , Rfl:   Review of Systems: Denies appetite changes, fevers, chills, fatigue, unexplained weight changes. Denies hearing loss, neck lumps or masses, mouth sores, ringing in ears or voice changes. Denies cough or wheezing.  Denies shortness of breath. Denies chest pain or palpitations. Denies leg swelling. Denies abdominal distention, pain, blood in stools, constipation, diarrhea, nausea, vomiting, or early satiety. Denies pain with intercourse, dysuria, frequency, hematuria or incontinence. Denies hot flashes, pelvic pain, vaginal bleeding or vaginal discharge.   Denies joint pain, back pain or muscle pain/cramps. Denies itching, rash, or wounds. Denies dizziness, headaches, numbness or seizures. Denies swollen lymph nodes or glands, denies easy bruising or bleeding. Denies anxiety, depression, confusion, or decreased concentration.  Physical Exam: BP 113/69 (BP Location: Right Arm, Patient Position: Sitting)   Pulse 69   Temp 98.4 F (36.9 C) (Oral)   Resp 18   Ht '5\' 5"'$  (1.651 m)   Wt 167 lb (75.8 kg)   SpO2 95%   BMI 27.79 kg/m  General: Alert, oriented, no acute  distress. HEENT: Normocephalic, atraumatic, sclera anicteric. Chest: Unlabored breathing on room air. Abdomen: soft, nondistended, nontender to palpation.   GU: Picture taken in media.  Some desquamation still of bilateral labia, overall stable from last visit.  Mild erythema along inner anterior left labia, no exudate.  No atypical vasculature.     Laboratory & Radiologic Studies: None new  Assessment & Plan: [name] is a 81 y.o. 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  Last procedure on 03/25/2021 included exam under anesthesia, vulvar biopsies, and vulvar laser ablation of dysplasia. Biopsies from this showed combination of VIN1, VIN 2-3, squamous mucosa with acute and chronic  inflammation and reactive atypia.   Patient has done well on alternating regimen of clobetasol and topical estrogen.  She remains relatively asymptomatic with minimal change to the exam (see erythema above).  Pictures taken again in media today with the patient's permission.  We will continue on her current regimen alternating between 4 weeks of estrogen followed by 2 weeks of clobetasol and then back to estrogen.   I will see the patient for follow-up in 3 months.  We reviewed signs and symptoms that should prompt a phone call prior to her neck scheduled visit.  At her visit in December, very likely we will plan for an outpatient procedure for vulvar biopsies, possible laser ablation given my concern that there are still a couple of areas that likely have vulvar dysplasia.  22 minutes of total time was spent for this patient encounter, including preparation, face-to-face counseling with the patient and coordination of care, and documentation of the encounter.  Jeral Pinch, MD  Division of Gynecologic Oncology  Department of Obstetrics and Gynecology  [facility]

## 2022-02-19 ENCOUNTER — Encounter: Payer: Self-pay | Admitting: Gynecologic Oncology

## 2022-02-20 ENCOUNTER — Other Ambulatory Visit: Payer: Self-pay

## 2022-02-20 ENCOUNTER — Encounter: Payer: Self-pay | Admitting: Gynecologic Oncology

## 2022-02-20 ENCOUNTER — Inpatient Hospital Stay: Payer: Medicare Other | Attending: Gynecologic Oncology | Admitting: Gynecologic Oncology

## 2022-02-20 VITALS — BP 136/79 | HR 83 | Temp 98.1°F | Resp 18 | Ht 65.0 in | Wt 166.2 lb

## 2022-02-20 DIAGNOSIS — N900 Mild vulvar dysplasia: Secondary | ICD-10-CM

## 2022-02-20 DIAGNOSIS — C519 Malignant neoplasm of vulva, unspecified: Secondary | ICD-10-CM

## 2022-02-20 DIAGNOSIS — N904 Leukoplakia of vulva: Secondary | ICD-10-CM | POA: Insufficient documentation

## 2022-02-20 DIAGNOSIS — Z8541 Personal history of malignant neoplasm of cervix uteri: Secondary | ICD-10-CM | POA: Insufficient documentation

## 2022-02-20 DIAGNOSIS — Z90710 Acquired absence of both cervix and uterus: Secondary | ICD-10-CM | POA: Diagnosis not present

## 2022-02-20 DIAGNOSIS — Z9079 Acquired absence of other genital organ(s): Secondary | ICD-10-CM | POA: Diagnosis not present

## 2022-02-20 MED ORDER — PREMARIN 0.625 MG/GM VA CREA: TOPICAL_CREAM | VAGINAL | 3 refills | Status: DC

## 2022-02-20 NOTE — Progress Notes (Signed)
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  It became more noticeable in April 2020 and she attempted to have an appointment with a gynecologist however she could not get an appointment due to the the coronavirus shot downs.  She was eventually able to see a physician on August 25, 2018 at which time evaluation of the vulva confirmed a vulvar mass.  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 Marti Sleigh.    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 09/27/18: Dr Alycia Rossetti performed a radical anterior vulvectomy.  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  The resection margins were negative for carcinoma with the closest being the lateral margin 1:00 at 0.8 cm.  The carcinoma invaded 1.3 cm depth.  There was no lymphovascular or perineural invasion identified.   Postoperatively a PET/CT was obtained on October 06, 2018.  This revealed postoperative changes to the vulva.  There was no hypermetabolic local regional lymphadenopathy.  There is a questionable subtle focus of hypermetabolism in the left lobe of the liver, indeterminate for liver metastases.  MRI abdomen could be performed for further work-up.  No other potential findings of hypermetabolic distant metastatic disease were seen.   On 10/25/2018, the patient underwent bilateral superficial inguinal lymphadenectomy with fibroadipose tissue noted on the left and no carcinoma seen in 2 of 2 lymph nodes on the right.   The patient was seen in  November with vulvar irritation that was intermittent in nature.  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  She was started on triamcinolone twice a day until her symptoms improved and then transition to daily.  I saw her at the end of December and she felt some improvement in her vulvar tenderness but still had significant erythema and irritation of the vulva.  I asked her to add Desitin or similar barrier cream.  When I saw her again in February, she had no improvement and had had significant irritation with the Desitin.  We stopped all topical treatment altogether.   ON 3/25, given persistent skin changes and no improvement in symptoms, vulvar biopsied was performed. Biopsy - high grade dysplasia.   06/15/19: partial simple left vulvectomy, partial simple peri-clitoral vulvectomy, vulvar biopsies, extensive CO2 laser ablation of the vulva. VIN3 noted in two vulvectomy specimens, positive margins of left specimen. Biopsies showed VIN1.    Treatment with vaginal/vulvar estrogen since surgery.   05/09/2020: Patient taken to the operating room for vulvar biopsies, lysis of vaginal agglutination, laser ablation of the vulva.  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   We used estrogen intermittently afterwards.   03/11/2021: Given continued vulvar erythema as well as desquamation, patient underwent EUA with vulvar biopsies and CO2 laser of the vulva.  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  Multiple biopsies showed high-grade vulvar dysplasia including vulvar biopsy at 1:00, 11:00, right periurethral, and left periclitoral.   03/25/2021: Given additional biopsies at her first surgery showing high-grade dysplasia that were not treated with laser ablation, additional CO2  laser of the vulva was recommended and performed.  Biopsies taken at the time of surgery showed focal acute inflammation and reactive atypia in 3 of the biopsies.  Only the right vulva at  10:00 showed VIN 2-3.  Interval History: Doing well.  Continues to have some baseline vulvar irritation and burning with urination, unchanged since her last visit.  Denies any bleeding or discharge.  Reports baseline bowel and bladder function.  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    Past Surgical History:  Procedure Laterality Date   ABDOMINAL HYSTERECTOMY  2008   Pelvic lymphadenectomy   BREAST SURGERY     left breast cyst in areolar area 46 years ago   CO2 LASER APPLICATION N/A 10/12/7617   Procedure: C02 LASER OF VULVAR, VULVAR BIOPSIES,  SIMPLE VULVECTOMY;  Surgeon: Lafonda Mosses, MD;  Location: [place];  Service: Gynecology;  Laterality: N/A;   CO2 LASER APPLICATION N/A 5/0/9326   Procedure: VULVAR  LASER APPLICATION;  Surgeon: Lafonda Mosses, MD;  Location: [facility];  Service: Gynecology;  Laterality: N/A;   CO2 LASER APPLICATION N/A 09/06/2456   Procedure: CO2 LASER APPLICATION TO VULVA;  Surgeon: Lafonda Mosses, MD;  Location: [facility];  Service: Gynecology;  Laterality: N/A;   CO2 LASER APPLICATION N/A 0/99/8338   Procedure: CO2 LASER APPLICATION TO VULVA;  Surgeon: Lafonda Mosses, MD;  Location: [facility];  Service: Gynecology;  Laterality: N/A;   LYMPH NODE DISSECTION Bilateral 10/25/2018   Procedure: LYMPH NODE DISSECTION,BILATERAL INGUINOFEMORAL LYMPHADECTOMY;  Surgeon: [name], MD;  Location: [facility];  Service: Gynecology;  Laterality:  Bilateral;   RADICAL VULVECTOMY N/A 09/27/2018   Procedure: RADICAL VULVECTOMY;  Surgeon: [name], MD;  Location: [facility];  Service: Gynecology;  Laterality: N/A;   SUPRAPUBIC CATHETER PLACEMENT     with 2008 surgery, later removed   VULVA /PERINEUM BIOPSY N/A 05/09/2020   Procedure: VULVAR BIOPSY;  Surgeon: Lafonda Mosses, MD;  Location: [facility];  Service: Gynecology;  Laterality: N/A;   VULVA Milagros Loll BIOPSY N/A 03/11/2021   Procedure: VULVAR BIOPSY;  Surgeon: Lafonda Mosses, MD;  Location: [facility];  Service: Gynecology;  Laterality: N/A;   VULVA Milagros Loll BIOPSY N/A 03/25/2021   Procedure: VULVAR BIOPSY;  Surgeon: Lafonda Mosses, MD;  Location: [facility];  Service: Gynecology;  Laterality: N/A;    Family History  Problem Relation Age of Onset   Skin cancer Father     Social History   Socioeconomic History   Marital status: Widowed    Spouse name: Not on file   Number of children: Not on file   Years of education: Not on file   Highest education level: Not on file  Occupational History   Not on file  Tobacco Use   Smoking status: Former    Types: Cigarettes    Quit date: 12/08/2003    Years  since quitting: 18.2   Smokeless tobacco: Never   Tobacco comments:    social  Vaping Use   Vaping Use: Never used  Substance and Sexual Activity   Alcohol use: No   Drug use: No   Sexual activity: Not Currently  Other Topics Concern   Not on file  Social History Narrative   Not on file   Social Determinants of Health   Financial Resource Strain: Not on file  Food Insecurity: Not on file  Transportation Needs: Not on file  Physical Activity: Not on file  Stress: Not on file  Social Connections: Not on file    Current Medications:  Current Outpatient Medications:    aspirin 81 MG chewable tablet, Chew 81 mg by mouth daily., Disp: , Rfl:    Carboxymethylcellulose Sod PF 1 % GEL, Apply to eye., Disp: ,  Rfl:    cholecalciferol (VITAMIN D3) 25 MCG (1000 UT) tablet, Take 2,000 Units by mouth every evening., Disp: , Rfl:    clobetasol ointment (TEMOVATE) 4.31 %, Apply 1 Application topically 2 (two) times daily. As instructed by your provider, Disp: 30 g, Rfl: 0   hydrochlorothiazide (HYDRODIURIL) 25 MG tablet, Take 12.5 mg by mouth daily., Disp: , Rfl:    levothyroxine (SYNTHROID) 75 MCG tablet, Take 75 mcg by mouth daily before breakfast., Disp: , Rfl:    Omega-3 Fatty Acids (FISH OIL) 1200 MG CAPS, Take 6,000 mg by mouth every evening. , Disp: , Rfl:    rosuvastatin (CRESTOR) 5 MG tablet, Take 5 mg by mouth at bedtime., Disp: , Rfl:    conjugated estrogens (PREMARIN) vaginal cream, APPLY DIME SIZE AMOUNT TO THE VULVA EVERY OTHER NIGHT, Disp: 30 g, Rfl: 3  Review of Systems: Denies appetite changes, fevers, chills, fatigue, unexplained weight changes. Denies hearing loss, neck lumps or masses, mouth sores, ringing in ears or voice changes. Denies cough or wheezing.  Denies shortness of breath. Denies chest pain or palpitations. Denies leg swelling. Denies abdominal distention, pain, blood in stools, constipation, diarrhea, nausea, vomiting, or early satiety. Denies pain with intercourse, dysuria, frequency, hematuria or incontinence. Denies hot flashes, pelvic pain, vaginal bleeding or vaginal discharge.   Denies joint pain, back pain or muscle pain/cramps. Denies itching, rash, or wounds. Denies dizziness, headaches, numbness or seizures. Denies swollen lymph nodes or glands, denies easy bruising or bleeding. Denies anxiety, depression, confusion, or decreased concentration.  Physical Exam: BP 136/79 (Patient Position: Sitting)   Pulse 83   Temp 98.1 F (36.7 C) (Oral)   Resp 18   Ht '5\' 5"'$  (1.651 m)   Wt 166 lb 4 oz (75.4 kg)   SpO2 95%   BMI 27.67 kg/m  General: Alert, oriented, no acute distress. HEENT: Normocephalic, atraumatic, sclera anicteric. Chest: Unlabored breathing on  room air.  Lungs clear to auscultation bilaterally. Cardiovascular: Regular rate and rhythm, no murmurs or rubs appreciated. Abdomen: soft, nondistended, nontender to palpation.   GU: Picture taken in media.  Some desquamation still of bilateral labia, overall stable from last visit.  Mild erythema along inner anterior left labia, no exudate.  No atypical vasculature.    Laboratory & Radiologic Studies: None new  Assessment & Plan: [name] is a 81 y.o. 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  Last procedure on 03/25/2021 included exam under anesthesia, vulvar biopsies, and vulvar laser ablation of dysplasia. Biopsies from this showed combination of VIN1, VIN 2-3,  squamous mucosa with acute and chronic inflammation and reactive atypia.   Patient continues to do well on alternating regimen of clobetasol and topical estrogen.  She remains relatively asymptomatic.  She has had some small change in the erythema and desquamated area on exam - pictures taken again in media today with the patient's permission.  Given some slow changes, we had previously discussed exam under anesthesia with biopsies and possible laser ablation given some areas concerning for vulvar dysplasia.  Will plan this for after the new year.  We will continue on her current regimen alternating between 4 weeks of estrogen followed by 2 weeks of clobetasol and then back to estrogen.  New prescription for estrogen sent today.   We discussed plan for exam under anesthesia, vulvar biopsies, possible for vulvar laser ablation.  We reviewed risks of surgery which include but are not limited to bleeding, need for blood transfusion, infection, damage to surrounding structures, VTE, anesthesia risk, and rarely death.  Preoperative education was performed today.  24 minutes of total time was spent for this patient encounter, including preparation,  face-to-face counseling with the patient and coordination of care, and documentation of the encounter.  Jeral Pinch, MD  Division of Gynecologic Oncology  Department of Obstetrics and Gynecology  [facility]

## 2022-02-20 NOTE — Patient Instructions (Addendum)
Preparing for your Surgery   Plan for surgery in January 2024 with Dr. Jeral Pinch at [facility]. You will be scheduled for an examination under anesthesia, vulvar biopsies, possible laser application to the vulva. We will contact you with a date when available.   We recommend purchasing several bags of frozen green peas and dividing them into ziploc bags. You will want to keep these in the freezer and have them ready to use as ice packs to the vulvar incision. Once the ice pack is no longer cold, you can get another from the freezer. The frozen peas mold to your body better than a regular ice pack.    Pre-operative Testing -You will receive a phone call from presurgical testing at [facility] to discuss surgery instructions and arrange for lab work if needed.   -Bring your insurance card, copy of an advanced directive if applicable, medication list.   -You should not be taking blood thinners or aspirin at least ten days prior to surgery unless instructed by your surgeon.   -Do not take supplements such as fish oil (omega 3), red yeast rice, turmeric before your surgery. You want to avoid medications with aspirin in them including headache powders such as BC or Goody's), Excedrin migraine.   Day Before Surgery at [place] will be advised you can have clear liquids up until 3 hours before your surgery.     Your role in recovery Your role is to become active as soon as directed by your doctor, while still giving yourself time to heal.  Rest when you feel tired. You will be asked to do the following in order to speed your recovery:   - Cough and breathe deeply. This helps to clear and expand your lungs and can prevent pneumonia after surgery.  - [place]. Do mild physical activity. Walking or moving your legs help your circulation and body functions return to normal. Do not try to get up or walk alone the first time after  surgery.   -If you develop swelling on one leg or the other, pain in the back of your leg, redness/warmth in one of your legs, please call the office or go to the Emergency Room to have a doppler to rule out a blood clot. For shortness of breath, chest pain-seek care in the Emergency Room as soon as possible. - Actively manage your pain. Managing your pain lets you move in comfort. We will ask you to rate your pain on a scale of zero to 10. It is your responsibility to tell your doctor or nurse where and how much you hurt so your pain can be treated.   Special Considerations -Your final pathology results from surgery should be available around one week after surgery and the results will be relayed to you when available.   -FMLA forms can be faxed to [phone] and please allow 5-7 business days for completion.   Pain Management After Surgery -Make sure that you have Tylenol if you are cleared to take these medications at home to use on a regular basis after surgery for pain control.    -Review the attached handout on narcotic use and their risks and side effects.    Bowel Regimen -It is important to prevent constipation and drink adequate amounts of liquids. You can use milk of mag you have at home.   Risks of Surgery Risks of surgery are low but include bleeding, infection, damage  to surrounding structures, re-operation, blood clots, and very rarely death.   AFTER SURGERY INSTRUCTIONS   Return to work:  2-4 weeks if applicable   Activity: 1. Be up and out of the bed during the day.  Take a nap if needed.  You may walk up steps but be careful and use the hand rail.  Stair climbing will tire you more than you think, you may need to stop part way and rest.    2. No lifting or straining for 2 weeks over 10 pounds. No pushing, pulling, straining for 2 weeks.   3. No driving for minimum 24 hours after surgery.  Do not drive if you are taking narcotic pain medicine and make sure that your  reaction time has returned.    4. You can shower as soon as the next day after surgery. Shower daily. No tub baths or submerging your body in water until cleared by your surgeon.    5. No sexual activity and nothing in the vagina for 4 weeks.   8. You may experience vulvar spotting and discharge after surgery.  The spotting is normal but if you experience heavy bleeding, call our office.   9. Take Tylenol or ibuprofen for pain. Monitor your Tylenol intake to a max of 4,000 mg in a 24 hour period.    Diet: 1. Low sodium Heart Healthy Diet is recommended but you are cleared to resume your normal (before surgery) diet after your procedure.   2. It is safe to use a laxative, such as Miralax or Colace, if you have difficulty moving your bowels. You can continue using the milk of mag you have at home.   Wound Care: 1. Keep clean and dry.  Shower daily.   Reasons to call the Doctor: Fever - Oral temperature greater than 100.4 degrees Fahrenheit Foul-smelling vaginal discharge Difficulty urinating Nausea and vomiting Increased pain at the site of the incision that is unrelieved with pain medicine. Difficulty breathing with or without chest pain New calf pain especially if only on one side Sudden, continuing increased vaginal bleeding with or without clots.   Contacts: For questions or concerns you should contact:   Dr. Jeral Pinch at [phone]   Joylene John, NP at [phone]   After Hours: call [phone] and have the GYN Oncologist paged/contacted (after 5 pm or on the weekends).   Messages sent via mychart are for non-urgent matters and are not responded to after hours so for urgent needs, please call the after hours number.

## 2022-02-26 ENCOUNTER — Telehealth: Payer: Self-pay

## 2022-02-26 NOTE — Telephone Encounter (Signed)
Pt called office stating she was seen on 12/15. She has been waiting on a call from our office regarding a surgery date for laser surgery.   [place] today and will not return until 03/11/22. She is giving the office permission to call her daughter Sharyon Medicus (number in chart) with surgery date.  Joylene John NP notified

## 2022-02-27 ENCOUNTER — Telehealth: Payer: Self-pay

## 2022-02-27 NOTE — Telephone Encounter (Signed)
Placed call and left VM for patient's daughter, Fraser Din, regarding potential surgical dates. Requested call back to clinic to discuss. 04/22/22 available at present with potential to move surgical date up when additional OR blocks become available.

## 2022-02-27 NOTE — Telephone Encounter (Signed)
Daughter returned call to clinic. Feb 14th OK to schedule surgery.

## 2022-03-30 ENCOUNTER — Ambulatory Visit: Payer: Medicare Other

## 2022-03-30 ENCOUNTER — Encounter (HOSPITAL_COMMUNITY): Payer: Medicare Other

## 2022-03-31 ENCOUNTER — Other Ambulatory Visit: Payer: Self-pay | Admitting: *Deleted

## 2022-03-31 DIAGNOSIS — I728 Aneurysm of other specified arteries: Secondary | ICD-10-CM

## 2022-04-10 NOTE — Progress Notes (Unsigned)
HISTORY AND PHYSICAL     CC:  follow up. Requesting Provider:  Cyndi Bender, PA-C  HPI: This is a 82 y.o. 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  In 2014 it measured 1.27m.  Dr. BTrula Sladehad discussed with pt that we would not consider elective repair until it has a diameter greater than 2cm.    Pt was last seen 12/05/2020 and at that time, she was doing well without any new abdominal or back pain.  She did not have fear of food or weight loss.  Duplex revealed celiac artery aneurysm measured 1.2cm and was unchanged over the past several yeas.    The pt returns today for follow up.  ***  The pt is on a statin for cholesterol management.    The pt is on an aspirin.    Other AC:  none The pt is on diuretic for hypertension.  The pt does not have diabetes. Tobacco hx:  former  Pt does *** have family hx of AAA.  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    Past Surgical History:  Procedure Laterality Date   ABDOMINAL HYSTERECTOMY  2008   Pelvic lymphadenectomy   BREAST SURGERY     left breast cyst in areolar area 46 years ago   CO2 LASER APPLICATION N/A 47/06/9447  Procedure: C02 LASER OF VULVAR, VULVAR BIOPSIES,  SIMPLE VULVECTOMY;  Surgeon: TLafonda Mosses MD;  [place] [facility] [place]  Service: Gynecology;  Laterality: N/A;   CO2 LASER APPLICATION N/A 36/09/5914  Procedure: VULVAR  LASER APPLICATION;  Surgeon: TLafonda Mosses MD;  [facility]  Service: Gynecology;  Laterality: N/A;   CO2 LASER APPLICATION N/A 13/10/4663  Procedure: CO2 LASER APPLICATION TO VULVA;  Surgeon: TLafonda Mosses MD;  [facility]  Service: Gynecology;  Laterality: N/A;   CO2 LASER APPLICATION N/A 19/93/5701  Procedure: CO2 LASER APPLICATION TO VULVA;  Surgeon: TLafonda Mosses MD;  [facility]  Service: Gynecology;  Laterality: N/A;   LYMPH NODE DISSECTION Bilateral 10/25/2018   Procedure: LYMPH NODE DISSECTION,BILATERAL INGUINOFEMORAL LYMPHADECTOMY;  Surgeon: GNancy Marus MD;  [facility];  Service: Gynecology;  Laterality: Bilateral;   RADICAL VULVECTOMY N/A 09/27/2018   Procedure: RADICAL VULVECTOMY;  Surgeon: GNancy Marus MD;  Location: [facility];  Service: Gynecology;  Laterality: N/A;   SUPRAPUBIC CATHETER PLACEMENT     with 2008 surgery, later removed   VULVA /PERINEUM BIOPSY N/A 05/09/2020   Procedure: VULVAR BIOPSY;  Surgeon: TLafonda Mosses MD;  [facility]  Service: Gynecology;  Laterality: N/A;   VULVA /Milagros LollBIOPSY N/A 03/11/2021   Procedure: VULVAR BIOPSY;  Surgeon: TLafonda Mosses MD;  [facility]  Service: Gynecology;  Laterality: N/A;   VULVA /PERINEUM BIOPSY N/A 03/25/2021   Procedure:  VULVAR BIOPSY;  Surgeon: Lafonda Mosses, MD;  Location: [facility];  Service: Gynecology;  Laterality: N/A;    Allergies  Allergen Reactions   Prednisone Other (See Comments)    Feels Dizzy   Codeine Anxiety    "Jumpy inside"   Latex Rash    Current Outpatient Medications  Medication Sig Dispense Refill   aspirin 81 MG chewable tablet Chew 81 mg by mouth daily.     Carboxymethylcellulose Sod PF 1 % GEL Apply to eye.     cholecalciferol (VITAMIN D3) 25 MCG (1000 UT) tablet Take 2,000 Units by mouth every evening.     clobetasol ointment (TEMOVATE) 4.40 % Apply 1 Application topically 2  (two) times daily. As instructed by your provider 30 g 0   conjugated estrogens (PREMARIN) vaginal cream APPLY DIME SIZE AMOUNT TO THE VULVA EVERY OTHER NIGHT 30 g 3   hydrochlorothiazide (HYDRODIURIL) 25 MG tablet Take 12.5 mg by mouth daily.     levothyroxine (SYNTHROID) 75 MCG tablet Take 75 mcg by mouth daily before breakfast.     Omega-3 Fatty Acids (FISH OIL) 1200 MG CAPS Take 6,000 mg by mouth every evening.      rosuvastatin (CRESTOR) 5 MG tablet Take 5 mg by mouth at bedtime.     No current facility-administered medications for this visit.    Family History  Problem Relation Age of Onset   Skin cancer Father     Social History   Socioeconomic History   Marital status: Widowed    Spouse name: Not on file   Number of children: Not on file   Years of education: Not on file   Highest education level: Not on file  Occupational History   Not on file  Tobacco Use   Smoking status: Former    Types: Cigarettes    Quit date: 12/08/2003    Years since quitting: 18.3   Smokeless tobacco: Never   Tobacco comments:    social  Vaping Use   Vaping Use: Never used  Substance and Sexual Activity   Alcohol use: No   Drug use: No   Sexual activity: Not Currently  Other Topics Concern   Not on file  Social History Narrative   Not on file   Social Determinants of Health   Financial Resource Strain: Not on file  Food Insecurity: Not on file  Transportation Needs: Not on file  Physical Activity: Not on file  Stress: Not on file  Social Connections: Not on file  Intimate Partner Violence: Not on file     REVIEW OF SYSTEMS:  *** '[X]'$  denotes positive finding, '[ ]'$  denotes negative finding Cardiac  Comments:  Chest pain or chest pressure:    Shortness of breath upon exertion:    Short of breath when lying flat:    Irregular heart rhythm:        Vascular    Pain in calf, thigh, or hip brought on by ambulation:    Pain in feet at night that wakes you up from your sleep:      Blood clot in your veins:    Leg swelling:         Pulmonary    Oxygen at home:    Productive cough:     Wheezing:         Neurologic    Sudden weakness in arms or legs:     Sudden numbness in arms or legs:     Sudden onset of difficulty  speaking or slurred speech:    Temporary loss of vision in one eye:     Problems with dizziness:         Gastrointestinal    Blood in stool:     Vomited blood:         Genitourinary    Burning when urinating:     Blood in urine:        Psychiatric    Major depression:         Hematologic    Bleeding problems:    Problems with blood clotting too easily:        Skin    Rashes or ulcers:        Constitutional    Fever or chills:      PHYSICAL EXAMINATION:  ***  General:  WDWN in NAD; vital signs documented above Gait: Not observed HENT: WNL, normocephalic Pulmonary: normal non-labored breathing , without wheezing Cardiac: {Desc; regular/irreg:14544} HR, {With/Without:20273} carotid bruit*** Abdomen: soft, NT; aortic pulse is *** palpable Skin: {With/Without:20273} rashes Vascular Exam/Pulses:  Right Left  Radial {Exam; arterial pulse strength 0-4:30167} {Exam; arterial pulse strength 0-4:30167}  Femoral {Exam; arterial pulse strength 0-4:30167} {Exam; arterial pulse strength 0-4:30167}  Popliteal {Exam; arterial pulse strength 0-4:30167} {Exam; arterial pulse strength 0-4:30167}  DP {Exam; arterial pulse strength 0-4:30167} {Exam; arterial pulse strength 0-4:30167}  PT {Exam; arterial pulse strength 0-4:30167} {Exam; arterial pulse strength 0-4:30167}  Peroneal *** ***   Extremities: {With/Without:20273} ischemic changes, {With/Without:20273} Gangrene , {With/Without:20273} cellulitis; {With/Without:20273} open wounds Musculoskeletal: no muscle wasting or atrophy  Neurologic: A&O X 3 Psychiatric:  The pt has {Desc; normal/abnormal:11317::"Normal"} affect.   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  Distal celiac artery aneurysm measures approx 0.996 x 1.19 cm.     ASSESSMENT/PLAN:: 82 y.o. female here for follow up for celiac artery aneurysm with hx of ***   -*** -continue *** -pt will f/u in *** with ***.   Leontine Locket, [facility] [phone]  Clinic MD:   Trula Slade

## 2022-04-13 ENCOUNTER — Ambulatory Visit (HOSPITAL_COMMUNITY)
Admission: RE | Admit: 2022-04-13 | Discharge: 2022-04-13 | Disposition: A | Discharge status: Home / Self Care | Payer: Medicare Other | Source: Ambulatory Visit | Attending: Surgery | Admitting: Surgery

## 2022-04-13 ENCOUNTER — Ambulatory Visit: Payer: Medicare Other | Admitting: Physician Assistant

## 2022-04-13 VITALS — BP 131/83 | HR 60 | Temp 97.8°F | Resp 20 | Ht 65.0 in | Wt 167.5 lb

## 2022-04-13 DIAGNOSIS — I728 Aneurysm of other specified arteries: Secondary | ICD-10-CM

## 2022-04-14 ENCOUNTER — Encounter (HOSPITAL_BASED_OUTPATIENT_CLINIC_OR_DEPARTMENT_OTHER): Payer: Self-pay | Admitting: Gynecologic Oncology

## 2022-04-14 NOTE — Progress Notes (Signed)
Spoke w/ via phone for pre-op interview--- pt Lab needs dos---- [employer], ekg              Lab results------ no COVID test -----patient states asymptomatic no test needed Arrive at -------  0630 on 04-22-2022 NPO after MN NO Solid Food.  Clear liquids from MN until--- 0530 Med rec completed  Medications to take morning of surgery ----- synthroid Diabetic medication ----- n/a Patient instructed no nail polish to be worn day of surgery Patient instructed to bring photo id and insurance card day of surgery Patient aware to have Driver (ride ) / caregiver    for 24 hours after surgery -- daughter, buffie Patient Special Instructions ----- n/a Pre-Op special Istructions ----- n/a Patient verbalized understanding of instructions that were given at this phone interview. Patient denies shortness of breath, chest pain, fever, cough at this phone interview.

## 2022-04-21 ENCOUNTER — Telehealth: Payer: Self-pay

## 2022-04-21 NOTE — Telephone Encounter (Signed)
Telephone call to check on pre-operative status.  Patient compliant with pre-operative instructions.  Reinforced nothing to eat after midnight. Clear liquids until 0530. Patient to arrive at 0630.  No questions or concerns voiced.  Instructed to call for any needs.

## 2022-04-21 NOTE — Anesthesia Preprocedure Evaluation (Signed)
Anesthesia Evaluation  Patient identified by MRN, date of birth, ID band Patient awake    Reviewed: Allergy & Precautions, NPO status , Patient's Chart, lab work & pertinent test results  History of Anesthesia Complications (+) PONV and history of anesthetic complications  Airway Mallampati: II  TM Distance: >3 FB Neck ROM: Full    Dental  (+) Missing,    Pulmonary former smoker   Pulmonary exam normal        Cardiovascular hypertension, Pt. on medications Normal cardiovascular exam     Neuro/Psych negative neurological ROS  negative psych ROS   GI/Hepatic negative GI ROS, Neg liver ROS,,,  Endo/Other  Hypothyroidism    Renal/GU negative Renal ROS  negative genitourinary   Musculoskeletal negative musculoskeletal ROS (+)    Abdominal   Peds  Hematology negative hematology ROS (+)   Anesthesia Other Findings Day of surgery medications reviewed with patient.  Reproductive/Obstetrics RECURRENT VAGINAL AND VULVAR DYSPLASIA                                 Anesthesia Physical Anesthesia Plan  ASA: 2  Anesthesia Plan: General   Post-op Pain Management: Tylenol PO (pre-op)*   Induction: Intravenous  PONV Risk Score and Plan: 4 or greater and Treatment may vary due to age or medical condition, Ondansetron and Dexamethasone  Airway Management Planned: LMA  Additional Equipment: None  Intra-op Plan:   Post-operative Plan: Extubation in OR  Informed Consent: I have reviewed the patients History and Physical, chart, labs and discussed the procedure including the risks, benefits and alternatives for the proposed anesthesia with the patient or authorized representative who has indicated his/her understanding and acceptance.     Dental advisory given  Plan Discussed with: CRNA  Anesthesia Plan Comments:          Anesthesia Quick Evaluation

## 2022-04-22 ENCOUNTER — Ambulatory Visit (HOSPITAL_BASED_OUTPATIENT_CLINIC_OR_DEPARTMENT_OTHER): Payer: Medicare Other | Admitting: Anesthesiology

## 2022-04-22 ENCOUNTER — Encounter (HOSPITAL_BASED_OUTPATIENT_CLINIC_OR_DEPARTMENT_OTHER): Payer: Self-pay | Admitting: Gynecologic Oncology

## 2022-04-22 ENCOUNTER — Other Ambulatory Visit: Payer: Self-pay

## 2022-04-22 ENCOUNTER — Ambulatory Visit (HOSPITAL_BASED_OUTPATIENT_CLINIC_OR_DEPARTMENT_OTHER)
Admission: RE | Admit: 2022-04-22 | Discharge: 2022-04-22 | Disposition: A | Discharge status: Home / Self Care | Payer: Medicare Other | Attending: Gynecologic Oncology | Admitting: Gynecologic Oncology

## 2022-04-22 ENCOUNTER — Encounter (HOSPITAL_BASED_OUTPATIENT_CLINIC_OR_DEPARTMENT_OTHER)
Admission: RE | Disposition: A | Discharge status: Home / Self Care | Payer: Self-pay | Source: Home / Self Care | Attending: Gynecologic Oncology

## 2022-04-22 DIAGNOSIS — Z01818 Encounter for other preprocedural examination: Secondary | ICD-10-CM

## 2022-04-22 DIAGNOSIS — C519 Malignant neoplasm of vulva, unspecified: Secondary | ICD-10-CM

## 2022-04-22 DIAGNOSIS — Z8544 Personal history of malignant neoplasm of other female genital organs: Secondary | ICD-10-CM | POA: Diagnosis not present

## 2022-04-22 DIAGNOSIS — Z87891 Personal history of nicotine dependence: Secondary | ICD-10-CM | POA: Diagnosis not present

## 2022-04-22 DIAGNOSIS — N9089 Other specified noninflammatory disorders of vulva and perineum: Secondary | ICD-10-CM | POA: Diagnosis not present

## 2022-04-22 DIAGNOSIS — D071 Carcinoma in situ of vulva: Secondary | ICD-10-CM | POA: Diagnosis present

## 2022-04-22 HISTORY — DX: Malignant neoplasm of vulva, unspecified: C51.9

## 2022-04-22 HISTORY — PX: CO2 LASER APPLICATION: SHX5778

## 2022-04-22 HISTORY — DX: Malignant neoplasm of vagina: C52

## 2022-04-22 HISTORY — DX: Leukoplakia of vulva: N90.4

## 2022-04-22 HISTORY — PX: VULVA /PERINEUM BIOPSY: SHX319

## 2022-04-22 HISTORY — DX: Personal history of vaginal dysplasia: Z87.411

## 2022-04-22 HISTORY — DX: Personal history of other diseases of the respiratory system: Z87.09

## 2022-04-22 LAB — POCT I-STAT, CHEM 8
BUN: 13 mg/dL (ref 8–23)
Calcium, Ion: 1.34 mmol/L (ref 1.15–1.40)
Chloride: 104 mmol/L (ref 98–111)
Creatinine, Ser: 0.8 mg/dL (ref 0.44–1.00)
Glucose, Bld: 117 mg/dL — ABNORMAL HIGH (ref 70–99)
HCT: 41 % (ref 36.0–46.0)
Hemoglobin: 13.9 g/dL (ref 12.0–15.0)
Potassium: 3.8 mmol/L (ref 3.5–5.1)
Sodium: 141 mmol/L (ref 135–145)
TCO2: 24 mmol/L (ref 22–32)

## 2022-04-22 SURGERY — EXAM UNDER ANESTHESIA
Anesthesia: General | Site: Vagina

## 2022-04-22 MED ORDER — AMISULPRIDE (ANTIEMETIC) 5 MG/2ML IV SOLN: 10.0000 mg | Freq: Once | INTRAVENOUS | Status: DC | PRN

## 2022-04-22 MED ORDER — LACTATED RINGERS IV SOLN: INTRAVENOUS | Status: DC

## 2022-04-22 MED ORDER — OXYCODONE HCL 5 MG/5ML PO SOLN: 5.0000 mg | Freq: Once | ORAL | Status: DC | PRN

## 2022-04-22 MED ORDER — ACETAMINOPHEN 500 MG PO TABS
ORAL_TABLET | ORAL | Status: AC
  Filled 2022-04-22: qty 2

## 2022-04-22 MED ORDER — LIDOCAINE 5 % EX OINT: 1.0000 | TOPICAL_OINTMENT | Freq: Three times a day (TID) | CUTANEOUS | 0 refills | Status: DC | PRN

## 2022-04-22 MED ORDER — ACETIC ACID 5 % SOLN
Status: DC | PRN
  Administered 2022-04-22: 1 via TOPICAL

## 2022-04-22 MED ORDER — ACETAMINOPHEN 500 MG PO TABS: 1000.0000 mg | ORAL_TABLET | Freq: Once | ORAL | Status: DC

## 2022-04-22 MED ORDER — DEXAMETHASONE SODIUM PHOSPHATE 10 MG/ML IJ SOLN
INTRAMUSCULAR | Status: DC | PRN
  Administered 2022-04-22: 5 mg via INTRAVENOUS

## 2022-04-22 MED ORDER — LIDOCAINE HCL 1 % IJ SOLN
INTRAMUSCULAR | Status: DC | PRN
  Administered 2022-04-22: 20 mL

## 2022-04-22 MED ORDER — DEXAMETHASONE SODIUM PHOSPHATE 10 MG/ML IJ SOLN
INTRAMUSCULAR | Status: AC
  Filled 2022-04-22: qty 1

## 2022-04-22 MED ORDER — PROPOFOL 10 MG/ML IV BOLUS
INTRAVENOUS | Status: DC | PRN
  Administered 2022-04-22: 10 mg via INTRAVENOUS
  Administered 2022-04-22 (×2): 20 mg via INTRAVENOUS
  Administered 2022-04-22: 130 mg via INTRAVENOUS

## 2022-04-22 MED ORDER — OXYCODONE HCL 5 MG PO TABS: 5.0000 mg | ORAL_TABLET | Freq: Once | ORAL | Status: DC | PRN

## 2022-04-22 MED ORDER — ONDANSETRON HCL 4 MG/2ML IJ SOLN
INTRAMUSCULAR | Status: AC
  Filled 2022-04-22: qty 2

## 2022-04-22 MED ORDER — STERILE WATER FOR IRRIGATION IR SOLN
Status: DC | PRN
  Administered 2022-04-22: 1000 mL

## 2022-04-22 MED ORDER — FENTANYL CITRATE (PF) 100 MCG/2ML IJ SOLN
INTRAMUSCULAR | Status: AC
  Filled 2022-04-22: qty 2

## 2022-04-22 MED ORDER — ONDANSETRON HCL 4 MG/2ML IJ SOLN
INTRAMUSCULAR | Status: DC | PRN
  Administered 2022-04-22: 4 mg via INTRAVENOUS

## 2022-04-22 MED ORDER — FENTANYL CITRATE (PF) 100 MCG/2ML IJ SOLN: 25.0000 ug | INTRAMUSCULAR | Status: DC | PRN

## 2022-04-22 MED ORDER — LIDOCAINE HCL (PF) 2 % IJ SOLN
INTRAMUSCULAR | Status: AC
  Filled 2022-04-22: qty 5

## 2022-04-22 MED ORDER — SILVER SULFADIAZINE 1 % EX CREA: 1.0000 | TOPICAL_CREAM | Freq: Two times a day (BID) | CUTANEOUS | 0 refills | Status: DC

## 2022-04-22 MED ORDER — PROPOFOL 10 MG/ML IV BOLUS
INTRAVENOUS | Status: AC
  Filled 2022-04-22: qty 20

## 2022-04-22 MED ORDER — 0.9 % SODIUM CHLORIDE (POUR BTL) OPTIME: TOPICAL | Status: DC | PRN

## 2022-04-22 MED ORDER — PROPOFOL 500 MG/50ML IV EMUL
INTRAVENOUS | Status: DC | PRN
  Administered 2022-04-22: 200 ug/kg/min via INTRAVENOUS

## 2022-04-22 MED ORDER — SILVER SULFADIAZINE 1 % EX CREA
TOPICAL_CREAM | CUTANEOUS | Status: DC | PRN
  Administered 2022-04-22: 1 via TOPICAL

## 2022-04-22 MED ORDER — ACETAMINOPHEN 500 MG PO TABS
1000.0000 mg | ORAL_TABLET | ORAL | Status: AC
  Administered 2022-04-22: 1000 mg via ORAL

## 2022-04-22 MED ORDER — FENTANYL CITRATE (PF) 100 MCG/2ML IJ SOLN
INTRAMUSCULAR | Status: DC | PRN
  Administered 2022-04-22: 50 ug via INTRAVENOUS
  Administered 2022-04-22: 25 ug via INTRAVENOUS

## 2022-04-22 MED ORDER — LIDOCAINE 2% (20 MG/ML) 5 ML SYRINGE
INTRAMUSCULAR | Status: DC | PRN
  Administered 2022-04-22: 100 mg via INTRAVENOUS

## 2022-04-22 MED ORDER — ARTIFICIAL TEARS OPHTHALMIC OINT
TOPICAL_OINTMENT | OPHTHALMIC | Status: AC
  Filled 2022-04-22: qty 10.5

## 2022-04-22 SURGICAL SUPPLY — 38 items
BLADE CLIPPER SENSICLIP SURGIC (BLADE) IMPLANT
BLADE SURG 11 STRL SS (BLADE) IMPLANT
BLADE SURG 15 STRL LF DISP TIS (BLADE) ×1 IMPLANT
BLADE SURG 15 STRL SS (BLADE) ×1
CATH ROBINSON RED A/P 16FR (CATHETERS) ×1 IMPLANT
DEPRESSOR TONGUE 6 IN STERILE (GAUZE/BANDAGES/DRESSINGS) ×1 IMPLANT
DRSG TELFA 3X8 NADH STRL (GAUZE/BANDAGES/DRESSINGS) IMPLANT
ELECT BALL LEEP 3MM BLK (ELECTRODE) IMPLANT
GAUZE 4X4 16PLY ~~LOC~~+RFID DBL (SPONGE) ×1 IMPLANT
GLOVE BIO SURGEON STRL SZ 6 (GLOVE) ×2 IMPLANT
GOWN STRL REUS W/TWL LRG LVL3 (GOWN DISPOSABLE) ×1 IMPLANT
KIT TURNOVER CYSTO (KITS) ×1 IMPLANT
NDL HYPO 25X1 1.5 SAFETY (NEEDLE) ×1 IMPLANT
NEEDLE HYPO 25X1 1.5 SAFETY (NEEDLE) ×1 IMPLANT
NS IRRIG 500ML POUR BTL (IV SOLUTION) ×1 IMPLANT
PACK PERINEAL COLD (PAD) ×1 IMPLANT
PACK VAGINAL WOMENS (CUSTOM PROCEDURE TRAY) ×1 IMPLANT
PAD PREP 24X48 CUFFED NSTRL (MISCELLANEOUS) ×1 IMPLANT
PUNCH BIOPSY DERMAL 3 (INSTRUMENTS) IMPLANT
PUNCH BIOPSY DERMAL 3MM (INSTRUMENTS)
PUNCH BIOPSY DERMAL 4MM (INSTRUMENTS) IMPLANT
SUT VIC AB 0 CT1 36 (SUTURE) IMPLANT
SUT VIC AB 0 SH 27 (SUTURE) ×1 IMPLANT
SUT VIC AB 2-0 CT2 27 (SUTURE) IMPLANT
SUT VIC AB 2-0 SH 27 (SUTURE)
SUT VIC AB 2-0 SH 27XBRD (SUTURE) IMPLANT
SUT VIC AB 3-0 PS2 18 (SUTURE)
SUT VIC AB 3-0 PS2 18XBRD (SUTURE) IMPLANT
SUT VIC AB 3-0 SH 27 (SUTURE) ×1
SUT VIC AB 3-0 SH 27X BRD (SUTURE) ×1 IMPLANT
SUT VIC AB 4-0 PS2 18 (SUTURE) ×1 IMPLANT
SUT VICRYL 0 UR6 27IN ABS (SUTURE) IMPLANT
SWAB OB GYN 8IN STERILE 2PK (MISCELLANEOUS) ×2 IMPLANT
SYR BULB IRRIG 60ML STRL (SYRINGE) ×1 IMPLANT
TOWEL OR 17X26 10 PK STRL BLUE (TOWEL DISPOSABLE) ×2 IMPLANT
TUBE CONNECTING 12X1/4 (SUCTIONS) IMPLANT
VACUUM HOSE 7/8X10 W/ WAND (MISCELLANEOUS) IMPLANT
WATER STERILE IRR 500ML POUR (IV SOLUTION) ×1 IMPLANT

## 2022-04-22 NOTE — H&P (Signed)
Gynecologic Oncology H&P  04/22/22  Treatment History: She had a palpable vulvar lump in the winter 2019 and 2020.  It became more noticeable in April 2020 and she attempted to have an appointment with a gynecologist however she could not get an appointment due to the the coronavirus shot downs.  She was eventually able to see a physician on August 25, 2018 at which time evaluation of the vulva confirmed a vulvar mass.  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 Marti Sleigh.    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 09/27/18: Dr Alycia Rossetti performed a radical anterior vulvectomy.  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  The resection margins were negative for carcinoma with the closest being the lateral margin 1:00 at 0.8 cm.  The carcinoma invaded 1.3 cm depth.  There was no lymphovascular or perineural invasion identified.   Postoperatively a PET/CT was obtained on October 06, 2018.  This revealed postoperative changes to the vulva.  There was no hypermetabolic local regional lymphadenopathy.  There is a questionable subtle focus of hypermetabolism in the left lobe of the liver, indeterminate for liver metastases.  MRI abdomen could be performed for further work-up.  No other potential findings of hypermetabolic distant metastatic disease were seen.   On 10/25/2018, the patient underwent bilateral superficial inguinal lymphadenectomy with fibroadipose tissue noted on the left and no carcinoma seen in 2 of 2 lymph nodes on the right.   The patient was seen in November with vulvar irritation that was intermittent in nature.  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  She was started on triamcinolone twice  a day until her symptoms improved and then transition to daily.  I saw her at the end of December and she felt some improvement in her vulvar tenderness but still had significant erythema and irritation of the vulva.  I asked her to add Desitin or similar barrier cream.  When I saw her again in February, she had no improvement and had had significant irritation with the Desitin.  We stopped all topical treatment altogether.   ON 3/25, given persistent skin changes and no improvement in symptoms, vulvar biopsied was performed. Biopsy - high grade dysplasia.   06/15/19: partial simple left vulvectomy, partial simple peri-clitoral vulvectomy, vulvar biopsies, extensive CO2 laser ablation of the vulva. VIN3 noted in two vulvectomy specimens, positive margins of left specimen. Biopsies showed VIN1.    Treatment with vaginal/vulvar estrogen since surgery.   05/09/2020: Patient taken to the operating room for vulvar biopsies, lysis of vaginal agglutination, laser ablation of the vulva.  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   We used estrogen intermittently afterwards.   03/11/2021: Given continued vulvar erythema as well as desquamation, patient underwent EUA with vulvar biopsies and CO2 laser of the vulva.  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  Multiple biopsies showed high-grade vulvar dysplasia including vulvar biopsy at 1:00, 11:00, right periurethral, and left periclitoral.   03/25/2021: Given additional biopsies at her first surgery showing high-grade dysplasia that were not treated with laser ablation, additional CO2 laser of the vulva was recommended and performed.  Biopsies taken at the time of surgery showed focal acute inflammation and reactive atypia in 3 of the biopsies.  Only the right  vulva at 10:00 showed VIN 2-3.  Interval History: Doing well. No significant change in symptoms since last visit.  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   followed by vascular-- dr Trula Slade;  Cassell Clement in epic 04-13-2022  stable no sig change since last visit   Complication of anesthesia    slow to wake up /  body temperature goes down   warm blankets with 1st surgery only   History of asthma    04-14-2022 per pt last had a rescue inhaler many yrs ago   History of cervical cancer 2008   s/p  TAH w/ BSO w/ pelvic lymphadenectomy for SCC of cervix   History of vaginal dysplasia    History of vulvar dysplasia 2020   with hx recurrence  s/p  bx's laser ablation and vulvectomy   Hyperlipidemia    Hypertension    Hypothyroidism    followed by pcp   Lichen sclerosus of vulva    PONV (postoperative nausea and vomiting)    NEEDS NAUSEA MED BEFORE SURGERY   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[place])    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[place])    Status post gamma knife treatment 03/2013   RIGHT V2  @ [facility] for trigeminal neuralgia    Past Surgical History:  Procedure Laterality Date   BREAST SURGERY Left    1970s  left breast cyst in areolar area   CATARACT EXTRACTION W/ INTRAOCULAR LENS IMPLANT Bilateral 0000000   CO2 LASER APPLICATION N/A [id number]   Procedure: C02 LASER OF VULVAR, VULVAR BIOPSIES,  SIMPLE VULVECTOMY;  Surgeon: Lafonda Mosses, MD;  Location: [place];  Service: Gynecology;  Laterality: N/A;   CO2 LASER APPLICATION N/A [id number]   Procedure: VULVAR  LASER APPLICATION;  Surgeon: Lafonda Mosses, MD;  Location: [facility];  Service: Gynecology;  Laterality: N/A;   CO2 LASER APPLICATION N/A 0000000   Procedure: CO2 LASER APPLICATION TO VULVA;  Surgeon: Lafonda Mosses, MD;  Location: [facility];  Service: Gynecology;  Laterality: N/A;   CO2 LASER APPLICATION N/A [id number]   Procedure: CO2 LASER APPLICATION TO VULVA;  Surgeon: Lafonda Mosses, MD;  Location: [facility];  Service: Gynecology;  Laterality: N/A;   LYMPH NODE DISSECTION Bilateral  10/25/2018   Procedure: LYMPH NODE DISSECTION,BILATERAL INGUINOFEMORAL LYMPHADECTOMY;  Surgeon: [name], MD;  Location: [facility];  Service: Gynecology;  Laterality: Bilateral;   RADICAL VULVECTOMY N/A 09/27/2018   Procedure: RADICAL VULVECTOMY;  Surgeon: [name], MD;  Location: [facility];  Service: Gynecology;  Laterality: N/A;   RETROMASTOID CRANIOTOMY Right 08/08/2020   @ [facility];   MCV DECOMPRESSION TRIGEMINAL NERVE   TOTAL ABDOMINAL HYSTERECTOMY W/ BILATERAL SALPINGOOPHORECTOMY  04/27/2006   @ [facility] by dr Aldean Ast;   and Pelvic lymphadenectomy / insertion suprapubic cath   VULVA [place] N/A 05/09/2020   Procedure: VULVAR BIOPSY;  Surgeon: Lafonda Mosses, MD;  Location: [facility];  Service: Gynecology;  Laterality: N/A;   VULVA Milagros Loll BIOPSY N/A 03/11/2021   Procedure: VULVAR BIOPSY;  Surgeon: Lafonda Mosses, MD;  Location: [facility];  Service: Gynecology;  Laterality: N/A;   VULVA Milagros Loll BIOPSY N/A 03/25/2021   Procedure: VULVAR BIOPSY;  Surgeon: Lafonda Mosses, MD;  Location: [facility];  Service: Gynecology;  Laterality: N/A;    Family History  Problem Relation Age of Onset   Skin cancer Father  Social History   Socioeconomic History   Marital status: Widowed    Spouse name: Not on file   Number of children: Not on file   Years of education: Not on file   Highest education level: Not on file  Occupational History   Not on file  Tobacco Use   Smoking status: Former    Types: Cigarettes    Quit date: 12/08/2003    Years since quitting: 18.3    Passive exposure: Current   Smokeless tobacco: Never   Tobacco comments:    social  Vaping Use   Vaping Use: Never used  Substance and Sexual Activity   Alcohol use: No   Drug use: No   Sexual activity: Not Currently  Other Topics Concern   Not on file  Social History Narrative   Not on file   Social Determinants of Health    Financial Resource Strain: Not on file  Food Insecurity: Not on file  Transportation Needs: Not on file  Physical Activity: Not on file  Stress: Not on file  Social Connections: Not on file    Current Medications:  Current Facility-Administered Medications:    acetaminophen (TYLENOL) tablet 1,000 mg, 1,000 mg, Oral, On Call to OR, Cross, Melissa D, NP   lactated ringers infusion, , Intravenous, Continuous, Nolon Nations, MD  Review of Systems: Denies appetite changes, fevers, chills, fatigue, unexplained weight changes. Denies hearing loss, neck lumps or masses, mouth sores, ringing in ears or voice changes. Denies cough or wheezing.  Denies shortness of breath. Denies chest pain or palpitations. Denies leg swelling. Denies abdominal distention, pain, blood in stools, constipation, diarrhea, nausea, vomiting, or early satiety. Denies pain with intercourse, dysuria, frequency, hematuria or incontinence. Denies hot flashes, pelvic pain, vaginal bleeding or vaginal discharge.   Denies joint pain, back pain or muscle pain/cramps. Denies itching, rash, or wounds. Denies dizziness, headaches, numbness or seizures. Denies swollen lymph nodes or glands, denies easy bruising or bleeding. Denies anxiety, depression, confusion, or decreased concentration.  Physical Exam: BP (!) 143/77   Pulse 69   Temp 97.6 F (36.4 C) (Oral)   Resp 16   Ht 5' 5"$  (1.651 m)   Wt 165 lb 9.6 oz (75.1 kg)   SpO2 95%   BMI 27.56 kg/m  General: Alert, oriented, no acute distress. HEENT: Normocephalic, atraumatic, sclera anicteric. Chest: Unlabored breathing on room air.  Lungs clear to auscultation bilaterally. Cardiovascular: Regular rate and rhythm, no murmurs or rubs appreciated. Abdomen: soft, nondistended, nontender to palpation.  Laboratory & Radiologic Studies: None new  Assessment & Plan: Kyran Bielen is a 82 y.o. woman with history of stage Ib vulvar cancer treated surgically in 2020 now  with recurrent and ongoing vaginal and vulvar dysplasia.  Last procedure on 03/25/2021 included exam under anesthesia, vulvar biopsies, and vulvar laser ablation of dysplasia. Biopsies from this showed combination of VIN1, VIN 2-3, squamous mucosa with acute and chronic inflammation and reactive atypia.   Patient presents today for EUA, vulvar biopsies, possible vulvar ablation.  Jeral Pinch, MD  Division of Gynecologic Oncology  Department of Obstetrics and Gynecology  [facility]

## 2022-04-22 NOTE — Op Note (Signed)
Operative Note  PATIENT: [name] DATE: 04/22/22  Preop Diagnosis: history of vulvar cancer, most recently followed for VIN  Postoperative Diagnosis: same as above  Surgery: vulvar biopsies, CO2 laser of the vulva  Surgeons:  Valarie Cones, MD  Assistant: none  Anesthesia: LMA  Estimated blood loss: 10 ml  IVF:  see I&O flowsheet    Urine output: n/a   Complications: None apparent  Pathology: periurethral biopsies, supraurethral biopsy, left labial biopsies, posterior fourchette biopsy  Operative findings: On EUA, patches of erythema and skin thinning affecting much of the introitus, more on right labia than left, peri-urethral area. Superior to and to the right of the urethral opening (> 1cm from the meatus), some mosaicism noted after application of acetic acid. Skin thinning around posterior fourchette with loss of architecture and typical cigarette paper appearance of lichen sclerosus. No atypical vascularity.   Procedure: The patient was identified in the preoperative holding area. Informed consent was signed on the chart. Patient was seen history was reviewed and exam was performed.   The patient was then taken to the operating room and placed in the supine position with SCD hose on. General anesthesia was then induced without difficulty. She was then placed in the dorsolithotomy position. The perineum was prepped with Betadine. The vagina was prepped with Betadine. The patient was then draped after the prep was dried.   Timeout was performed the patient, procedure, antibiotic, allergy, and length of procedure. 5% acetic acid solution was applied to the perineum. The vulvar tissues were inspected for areas of acetowhite changes or leukoplakia. The lesion was identified and monopolar electrocautery was used to circumscribe the area with appropriate surgical margins. The subcuticular tissues were infiltrated with 1% lidocaine.   Biopsies were taken from the perineum.  Speculum exam was performed with atrophic tissue noted at the vaginal cuff, no lesions noted.  The patient's surgical field was draped with wet towels. The staff and patient ensured laser-safe eyewear and masks were fitted. The laser was set to 8 watts continuous. The laser was tested for accuracy on a tongue depressor.  The laser was applied to the circumscribed area of the vulva that had been previously identified. The tissue was ablated to the desired depth and the eschar was removed with a moistened sponge. When the entire lesion had been ablated the procedure was complete.  Silvadine cream was applied to the laser site.  All instrument, suture, laparotomy, Ray-Tec, and needle counts were correct x2. The patient tolerated the procedure well and was taken recovery room in stable condition.   Jeral Pinch MD Gynecologic Oncology

## 2022-04-22 NOTE — Anesthesia Postprocedure Evaluation (Signed)
Anesthesia Post Note  Patient: [name]  Procedure(s) Performed: EXAM UNDER ANESTHESIA (Vagina ) VULVAR BIOPSY (Vagina ) CO2 LASER ABLATION TO VULVA (Vagina )     Patient location during evaluation: PACU Anesthesia Type: General Level of consciousness: awake and alert Pain management: pain level controlled Vital Signs Assessment: post-procedure vital signs reviewed and stable Respiratory status: spontaneous breathing, nonlabored ventilation and respiratory function stable Cardiovascular status: blood pressure returned to baseline Postop Assessment: no apparent nausea or vomiting Anesthetic complications: no   No notable events documented.  Last Vitals:  Vitals:   04/22/22 1000 04/22/22 1015  BP: 118/65 133/74  Pulse: 62 (!) 58  Resp: 17 16  Temp:  36.4 C  SpO2: 91% 93%    Last Pain:  Vitals:   04/22/22 1015  TempSrc:   PainSc: 0-No pain                 Marthenia Rolling

## 2022-04-22 NOTE — Transfer of Care (Signed)
Immediate Anesthesia Transfer of Care Note  Patient: [name]  Procedure(s) Performed: Jasmine December UNDER ANESTHESIA (Vagina ) VULVAR BIOPSY (Vagina ) CO2 LASER ABLATION TO VULVA (Vagina )  Patient Location: PACU  Anesthesia Type:General  Level of Consciousness: awake, alert , oriented, and patient cooperative  Airway & Oxygen Therapy: Patient Spontanous Breathing  Post-op Assessment: Report given to RN and Post -op Vital signs reviewed and stable  Post vital signs: Reviewed and stable  Last Vitals:  Vitals Value Taken Time  BP    Temp    Pulse 62 04/22/22 0917  Resp 13 04/22/22 0917  SpO2 91 % 04/22/22 0917  Vitals shown include unvalidated device data.  Last Pain:  Vitals:   04/22/22 0639  TempSrc: Oral      Patients Stated Pain Goal: 7 [id number])  Complications: No notable events documented.

## 2022-04-22 NOTE — Discharge Instructions (Addendum)
AFTER SURGERY INSTRUCTIONS   Return to work:  2-4 weeks if applicable   We recommend purchasing several bags of frozen green peas and dividing them into ziploc bags. You will want to keep these in the freezer and have them ready to use as ice packs to the vulvar incision. Once the ice pack is no longer cold, you can get another from the freezer. The frozen peas mold to your body better than a regular ice pack.   You have been prescribed topical silvadene cream to apply at least twice daily along with a topical lidocaine ointment to apply to the vulva as needed for discomfort.  You can hold off on using premarin cream during the immediate post-op period while using silvadene and lidocaine  Activity: 1. Be up and out of the bed during the day.  Take a nap if needed.  You may walk up steps but be careful and use the hand rail.  Stair climbing will tire you more than you think, you may need to stop part way and rest.    2. No lifting or straining for 1 week minimum over 10 pounds. No pushing, pulling, straining for 1 week minimum.   3. No driving for minimum 24 hours after surgery.  Do not drive if you are taking narcotic pain medicine and make sure that your reaction time has returned.    4. You can shower as soon as the next day after surgery. Shower daily. No tub baths or submerging your body in water until cleared by your surgeon.    5. No sexual activity and nothing in the vagina for 4 weeks.   6. You may experience vulvar spotting and discharge after surgery.  The spotting is normal but if you experience heavy bleeding, call our office.   7. Take Tylenol or ibuprofen for pain. Monitor your Tylenol intake to a max of 4,000 mg in a 24 hour period.    Diet: 1. Low sodium Heart Healthy Diet is recommended but you are cleared to resume your normal (before surgery) diet after your procedure.   2. It is safe to use a laxative, such as Miralax or Colace, if you have difficulty moving your  bowels. You can continue using the milk of mag you have at home.   Wound Care: 1. Keep clean and dry.  Shower daily.   Reasons to call the Doctor: Fever - Oral temperature greater than 100.4 degrees Fahrenheit Foul-smelling vaginal discharge Difficulty urinating Nausea and vomiting Increased pain at the site of the incision that is unrelieved with pain medicine. Difficulty breathing with or without chest pain New calf pain especially if only on one side Sudden, continuing increased vaginal bleeding with or without clots.   Contacts: For questions or concerns you should contact:   Dr. Jeral Pinch at [phone]   Joylene John, NP at [phone]   After Hours: call [phone] and have the GYN Oncologist paged/contacted (after 5 pm or on the weekends).   Messages sent via mychart are for non-urgent matters and are not responded to after hours so for urgent needs, please call the after hours number.           No acetaminophen/Tylenol until after 1:00 pm today if needed.   Post Anesthesia Home Care Instructions  Activity: Get plenty of rest for the remainder of the day. A responsible individual must stay with you for 24 hours following the procedure.  For the next 24 hours, DO NOT: -Drive a car -Operate  machinery -Drink alcoholic beverages -Take any medication unless instructed by your physician -Make any legal decisions or sign important papers.  Meals: Start with liquid foods such as gelatin or soup. Progress to regular foods as tolerated. Avoid greasy, spicy, heavy foods. If nausea and/or vomiting occur, drink only clear liquids until the nausea and/or vomiting subsides. Call your physician if vomiting continues.  Special Instructions/Symptoms: Your throat may feel dry or sore from the anesthesia or the breathing tube placed in your throat during surgery. If this causes discomfort, gargle with warm salt water. The discomfort should disappear within 24  hours.

## 2022-04-22 NOTE — Anesthesia Procedure Notes (Signed)
Procedure Name: LMA Insertion Date/Time: 04/22/2022 8:39 AM  Performed by: Rogers Blocker, CRNAPre-anesthesia Checklist: Patient identified, Emergency Drugs available, Suction available and Patient being monitored Patient Re-evaluated:Patient Re-evaluated prior to induction Oxygen Delivery Method: Circle System Utilized Preoxygenation: Pre-oxygenation with 100% oxygen Induction Type: IV induction Ventilation: Mask ventilation without difficulty LMA: LMA inserted LMA Size: 4.0 Number of attempts: 1 Airway Equipment and Method: Bite block Placement Confirmation: positive ETCO2 Tube secured with: Tape Dental Injury: Teeth and Oropharynx as per pre-operative assessment

## 2022-04-23 ENCOUNTER — Telehealth: Payer: Self-pay

## 2022-04-23 NOTE — Telephone Encounter (Signed)
Spoke with [name] this morning. She states she is eating, drinking and urinating well. She has not had a BM yet but is passing gas. She is taking senokot as prescribed and encouraged her to drink plenty of water. She denies fever or chills. She is not able to see the surgery site. She rates her pain 0/10. Her pain is controlled with Tylenol only day she came home.    Instructed to call office with any fever, chills, purulent drainage, uncontrolled pain or any other questions or concerns. Patient verbalizes understanding.   Pt aware of post op appointments as well as the office number [phone] and after hours number [phone] to call if she has any questions or concerns

## 2022-04-24 ENCOUNTER — Telehealth: Payer: Self-pay | Admitting: Gynecologic Oncology

## 2022-04-24 ENCOUNTER — Encounter (HOSPITAL_BASED_OUTPATIENT_CLINIC_OR_DEPARTMENT_OTHER): Payer: Self-pay | Admitting: Gynecologic Oncology

## 2022-04-24 LAB — SURGICAL PATHOLOGY

## 2022-04-24 NOTE — Telephone Encounter (Signed)
I called the patient.  We discussed the biopsy results from surgery this week.  She is overall doing well.  Jeral Pinch MD Gynecologic Oncology

## 2022-05-26 ENCOUNTER — Telehealth: Payer: Self-pay | Admitting: Oncology

## 2022-05-26 ENCOUNTER — Other Ambulatory Visit: Payer: Self-pay

## 2022-05-26 ENCOUNTER — Inpatient Hospital Stay: Payer: Medicare Other | Attending: Gynecologic Oncology

## 2022-05-26 ENCOUNTER — Telehealth: Payer: Self-pay | Admitting: *Deleted

## 2022-05-26 ENCOUNTER — Other Ambulatory Visit: Payer: Self-pay | Admitting: Gynecologic Oncology

## 2022-05-26 DIAGNOSIS — R39198 Other difficulties with micturition: Secondary | ICD-10-CM | POA: Diagnosis not present

## 2022-05-26 DIAGNOSIS — R300 Dysuria: Secondary | ICD-10-CM

## 2022-05-26 DIAGNOSIS — N3001 Acute cystitis with hematuria: Secondary | ICD-10-CM

## 2022-05-26 DIAGNOSIS — R3989 Other symptoms and signs involving the genitourinary system: Secondary | ICD-10-CM

## 2022-05-26 LAB — URINALYSIS, COMPLETE (UACMP) WITH MICROSCOPIC
Bilirubin Urine: NEGATIVE
Glucose, UA: NEGATIVE mg/dL
Ketones, ur: NEGATIVE mg/dL
Nitrite: NEGATIVE
Protein, ur: NEGATIVE mg/dL
RBC / HPF: >50 RBC/hpf (ref 0–5)
Specific Gravity, Urine: 1.012 (ref 1.005–1.030)
WBC, UA: >50 WBC/hpf (ref 0–5)
pH: 6 (ref 5.0–8.0)

## 2022-05-26 MED ORDER — NITROFURANTOIN MONOHYD MACRO 100 MG PO CAPS: 100.0000 mg | ORAL_CAPSULE | Freq: Two times a day (BID) | ORAL | 0 refills | Status: DC

## 2022-05-26 NOTE — Telephone Encounter (Signed)
Patient called and stated "I have been having UTI symptoms for about 4 days now, and it's getting worse. I have some pain and a lot of burning with urination. The burning and pain is with  urination only. I do have some pressure with the urination, but only with the urination. I have been having more frequency with urination and feel like I'm not emptying my bladder. My urine does have a strng urine smell and is yellow. There is sometimes a little blood."   Patient asked if she can come in today to either our office or her PCP for a urine sample. Patient stated she had no ride and hr PCP is full, no opening. Explained that the message would be given to the Dr Sarina Ill APP and the office would call her back later today. Patient stated she would try to find a ride tho come for a lab appt this week.

## 2022-05-26 NOTE — Telephone Encounter (Signed)
Pt is coming today and on lab schedule for 3:00.  Joylene John NP notified.

## 2022-05-26 NOTE — Telephone Encounter (Signed)
I spoke to [name], she states it is definitely UTI symptoms, only has burning and pressure with urination. Urgency, when she goes she does very little. She is aware we can not treat until we know exactly the type of antibiotic is needed. She understands we need to test her urine and states she will call someone to bring her but will call me back so I can put her on the lab schedule.

## 2022-05-26 NOTE — Telephone Encounter (Signed)
Called [name] and advised that her urinalysis results look like an infection.  Let her know that we have sent in a prescription for Macrobid to [facility] that she can start while waiting for the urine culture results.  Discussed that we may have to change the antibiotic based on the results.  Also advised her to stay hydrated while taking Macrobid.  She verbalized understanding and did not have any further questions.

## 2022-05-27 ENCOUNTER — Telehealth: Payer: Self-pay

## 2022-05-27 LAB — URINE CULTURE: Culture: 20000 — AB

## 2022-05-27 NOTE — Telephone Encounter (Signed)
Pt states she is feeling a lot better on the Macrobid. No pain or burning. She does not think she needs anything stronger.  Joylene John NP notified

## 2022-05-27 NOTE — Telephone Encounter (Signed)
-----   Message from Dorothyann Gibbs, NP sent at 05/27/2022  3:41 PM EDT ----- Urine culture showing 20, 000 colonies of group b strep. Can you see how her symptoms are on the macrobid? We can always change to a pcn if she is not feeling better ----- Message ----- From: Buel Ream, [facility] [place] Sent: 05/26/2022   3:39 PM EDT To: Dorothyann Gibbs, NP

## 2022-05-28 ENCOUNTER — Other Ambulatory Visit: Payer: Self-pay | Admitting: Gynecologic Oncology

## 2022-05-28 ENCOUNTER — Telehealth: Payer: Self-pay | Admitting: *Deleted

## 2022-05-28 DIAGNOSIS — N3001 Acute cystitis with hematuria: Secondary | ICD-10-CM

## 2022-05-28 DIAGNOSIS — R300 Dysuria: Secondary | ICD-10-CM

## 2022-05-28 MED ORDER — AMPICILLIN 500 MG PO CAPS: 500.0000 mg | ORAL_CAPSULE | Freq: Three times a day (TID) | ORAL | 0 refills | Status: DC

## 2022-05-28 NOTE — Telephone Encounter (Signed)
Patient called and stated "Dr Berline Lopes started me on medicine on Tuesday for a UTI. I thought it was working. Kim called me yesterday and said to call the office if the medicine doesn't work and the office would call in a stronger medicine.This morning the burning and pressure with urination is worse. There is no odor and the color is still yellow. I had have no fever or chills. I do have some blood still."    Explained that the message would be given to Dr Sarina Ill APP and the office would call her back. Phone number and pharmacy verified.

## 2022-05-28 NOTE — Telephone Encounter (Signed)
Pt calling to follow up from earlier call.  She is wanting to know if another medication is being sent in?  Joylene John NP and Dr. Berline Lopes notified.

## 2022-05-28 NOTE — Telephone Encounter (Signed)
Per Joylene John NP, pt is aware of stronger med being sent to pharmacy. She states an understanding and will wait for pharmacy to call her.

## 2022-06-04 ENCOUNTER — Other Ambulatory Visit: Payer: Self-pay

## 2022-06-04 ENCOUNTER — Inpatient Hospital Stay (HOSPITAL_BASED_OUTPATIENT_CLINIC_OR_DEPARTMENT_OTHER): Payer: Medicare Other | Admitting: Gynecologic Oncology

## 2022-06-04 ENCOUNTER — Encounter: Payer: Self-pay | Admitting: Gynecologic Oncology

## 2022-06-04 VITALS — BP 117/73 | HR 82 | Temp 98.2°F | Resp 16 | Ht 65.0 in | Wt 161.9 lb

## 2022-06-04 DIAGNOSIS — Z9889 Other specified postprocedural states: Secondary | ICD-10-CM

## 2022-06-04 DIAGNOSIS — D071 Carcinoma in situ of vulva: Secondary | ICD-10-CM

## 2022-06-04 DIAGNOSIS — C519 Malignant neoplasm of vulva, unspecified: Secondary | ICD-10-CM

## 2022-06-04 DIAGNOSIS — Z79890 Hormone replacement therapy (postmenopausal): Secondary | ICD-10-CM

## 2022-06-04 DIAGNOSIS — Z8544 Personal history of malignant neoplasm of other female genital organs: Secondary | ICD-10-CM

## 2022-06-04 DIAGNOSIS — N9089 Other specified noninflammatory disorders of vulva and perineum: Secondary | ICD-10-CM

## 2022-06-04 NOTE — Progress Notes (Signed)
Gynecologic Oncology Return Clinic Visit  06/04/22  Reason for Visit: follow-up after surgery, treatment planning  Treatment History: She had a palpable vulvar lump in the winter 2019 and 2020.  It became more noticeable in April 2020 and she attempted to have an appointment with a gynecologist however she could not get an appointment due to the the coronavirus shot downs.  She was eventually able to see a physician on August 25, 2018 at which time evaluation of the vulva confirmed a vulvar mass.  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 Marti Sleigh.    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 09/27/18: Dr Alycia Rossetti performed a radical anterior vulvectomy.  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  The resection margins were negative for carcinoma with the closest being the lateral margin 1:00 at 0.8 cm.  The carcinoma invaded 1.3 cm depth.  There was no lymphovascular or perineural invasion identified.   Postoperatively a PET/CT was obtained on October 06, 2018.  This revealed postoperative changes to the vulva.  There was no hypermetabolic local regional lymphadenopathy.  There is a questionable subtle focus of hypermetabolism in the left lobe of the liver, indeterminate for liver metastases.  MRI abdomen could be performed for further work-up.  No other potential findings of hypermetabolic distant metastatic disease were seen.   On 10/25/2018, the patient underwent bilateral superficial inguinal lymphadenectomy with fibroadipose tissue noted on the left and no carcinoma seen in 2 of 2 lymph nodes on the right.   The patient was seen in November with vulvar irritation that was intermittent in nature.  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  She was started on triamcinolone twice a day until her symptoms improved and then transition to daily.  I saw her at the end of December and she felt some improvement in her vulvar tenderness but still had significant erythema and irritation of the vulva.  I asked her to add Desitin or similar barrier cream.  When I saw her again in February, she had no improvement and had had significant irritation with the Desitin.  We stopped all topical treatment altogether.   ON 3/25, given persistent skin changes and no improvement in symptoms, vulvar biopsied was performed. Biopsy - high grade dysplasia.   06/15/19: partial simple left vulvectomy, partial simple peri-clitoral vulvectomy, vulvar biopsies, extensive CO2 laser ablation of the vulva. VIN3 noted in two vulvectomy specimens, positive margins of left specimen. Biopsies showed VIN1.    Treatment with vaginal/vulvar estrogen since surgery.   05/09/2020: Patient taken to the operating room for vulvar biopsies, lysis of vaginal agglutination, laser ablation of the vulva.  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   We used estrogen intermittently afterwards.   03/11/2021: Given continued vulvar erythema as well as desquamation, patient underwent EUA with vulvar biopsies and CO2 laser of the vulva.  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  Multiple biopsies showed high-grade vulvar dysplasia including vulvar biopsy at 1:00, 11:00, right periurethral, and left periclitoral.   03/25/2021: Given additional biopsies at her first surgery showing high-grade dysplasia that were not treated with laser ablation, additional CO2 laser of the vulva was recommended and performed.  Biopsies taken at the time of surgery showed focal acute inflammation and  reactive atypia in 3 of the biopsies.  Only the right vulva at 10:00 showed VIN 2-3.  04/22/22: vulvar biopsies, CO2 laser of the vulva  Interval History: Doing well.   Still sore but this is improving.  Has intermittent pruritus, which she thinks is due to moisture.  Has been using the Silvadene cream since surgery.  When she has to urinate, feels like she often has to push a little bit to get past obstruction (which she is able to do), has burning initially when her urine stream starts.  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   followed by vascular-- dr Trula Slade;  Cassell Clement in epic 04-13-2022  stable no sig change since last visit   Complication of anesthesia    slow to wake up /  body temperature goes down   warm blankets with 1st surgery only   History of asthma    04-14-2022 per pt last had a rescue inhaler many yrs ago   History of cervical cancer 2008   s/p  TAH w/ BSO w/ pelvic lymphadenectomy for SCC of cervix   History of vaginal dysplasia    History of vulvar dysplasia 2020   with hx recurrence  s/p  bx's laser ablation and vulvectomy   Hyperlipidemia    Hypertension    Hypothyroidism    followed by pcp   Lichen sclerosus of vulva    PONV (postoperative nausea and vomiting)    NEEDS NAUSEA MED BEFORE SURGERY   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[place])    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[place])    Status post gamma knife treatment 03/2013   RIGHT V2  @ [facility] for trigeminal neuralgia    Past Surgical History:  Procedure Laterality Date   BREAST SURGERY Left    1970s  left breast cyst in areolar area   CATARACT EXTRACTION W/ INTRAOCULAR LENS IMPLANT Bilateral 0000000   CO2 LASER APPLICATION N/A [id number]   Procedure: C02 LASER OF VULVAR, VULVAR BIOPSIES,  SIMPLE VULVECTOMY;  Surgeon: Lafonda Mosses, MD;  Location: [facility];  Service: Gynecology;  Laterality: N/A;   CO2 LASER APPLICATION N/A [id number]   Procedure: VULVAR  LASER APPLICATION;  Surgeon: Lafonda Mosses, MD;  Location: [facility];  Service: Gynecology;  Laterality: N/A;   CO2 LASER APPLICATION  N/A 0000000   Procedure: CO2 LASER APPLICATION TO VULVA;  Surgeon: Lafonda Mosses, MD;  Location: [facility];  Service: Gynecology;  Laterality: N/A;   CO2 LASER APPLICATION N/A [id number]   Procedure: CO2 LASER APPLICATION TO VULVA;  Surgeon: Lafonda Mosses, MD;  Location: [facility];  Service: Gynecology;  Laterality: N/A;   CO2 LASER APPLICATION N/A [id number]   Procedure: CO2 LASER ABLATION TO VULVA;  Surgeon: Lafonda Mosses, MD;  Location: [facility];  Service: Gynecology;  Laterality: N/A;   LYMPH NODE DISSECTION Bilateral 10/25/2018   Procedure: LYMPH NODE DISSECTION,BILATERAL INGUINOFEMORAL LYMPHADECTOMY;  Surgeon: [name], MD;  Location: [facility];  Service: Gynecology;  Laterality: Bilateral;   RADICAL VULVECTOMY N/A 09/27/2018   Procedure: RADICAL VULVECTOMY;  Surgeon: [name], MD;  Location: [facility];  Service: Gynecology;  Laterality: N/A;   RETROMASTOID CRANIOTOMY Right 08/08/2020   @ [facility];   MCV DECOMPRESSION TRIGEMINAL NERVE   TOTAL ABDOMINAL HYSTERECTOMY W/ BILATERAL SALPINGOOPHORECTOMY  04/27/2006   @ [facility] by dr Aldean Ast;   and Pelvic lymphadenectomy / insertion suprapubic cath   VULVA /  PERINEUM BIOPSY N/A 05/09/2020   Procedure: VULVAR BIOPSY;  Surgeon: Lafonda Mosses, MD;  Location: [facility];  Service: Gynecology;  Laterality: N/A;   VULVA Milagros Loll BIOPSY N/A 03/11/2021   Procedure: VULVAR BIOPSY;  Surgeon: Lafonda Mosses, MD;  Location: [facility];  Service: Gynecology;  Laterality: N/A;   VULVA Milagros Loll BIOPSY N/A 03/25/2021   Procedure: VULVAR BIOPSY;  Surgeon: Lafonda Mosses, MD;  Location: [facility];  Service: Gynecology;  Laterality: N/A;   VULVA Milagros Loll BIOPSY N/A 04/22/2022   Procedure: VULVAR BIOPSY;  Surgeon: Lafonda Mosses, MD;  Location: [facility];  Service: Gynecology;  Laterality: N/A;     Family History  Problem Relation Age of Onset   Skin cancer Father     Social History   Socioeconomic History   Marital status: Widowed    Spouse name: Not on file   Number of children: Not on file   Years of education: Not on file   Highest education level: Not on file  Occupational History   Not on file  Tobacco Use   Smoking status: Former    Types: Cigarettes    Quit date: 12/08/2003    Years since quitting: 18.5    Passive exposure: Current   Smokeless tobacco: Never   Tobacco comments:    social  Vaping Use   Vaping Use: Never used  Substance and Sexual Activity   Alcohol use: No   Drug use: No   Sexual activity: Not Currently  Other Topics Concern   Not on file  Social History Narrative   Not on file   Social Determinants of Health   Financial Resource Strain: Not on file  Food Insecurity: Not on file  Transportation Needs: Not on file  Physical Activity: Not on file  Stress: Not on file  Social Connections: Not on file    Current Medications:  Current Outpatient Medications:    ampicillin (PRINCIPEN) 500 MG capsule, Take 1 capsule (500 mg total) by mouth 3 (three) times daily., Disp: 9 capsule, Rfl: 0   aspirin 81 MG chewable tablet, Chew 81 mg by mouth at bedtime., Disp: , Rfl:    Carboxymethylcellulose Sod PF 1 % GEL, Apply to eye 6 (six) times daily., Disp: , Rfl:    Cholecalciferol (VITAMIN D3) 50 MCG (2000 UT) TABS, Take 1 tablet by mouth every evening., Disp: , Rfl:    clobetasol ointment (TEMOVATE) [id number] %, Apply 1 Application topically 2 (two) times daily. As instructed by your provider, Disp: 30 g, Rfl: 0   conjugated estrogens (PREMARIN) vaginal cream, APPLY DIME SIZE AMOUNT TO THE VULVA EVERY OTHER NIGHT (Patient taking differently: Place 1 applicator vaginally at bedtime. APPLY DIME SIZE AMOUNT TO THE VULVA EVERY OTHER NIGHT), Disp: 30 g, Rfl: 3   hydrochlorothiazide (HYDRODIURIL) 25 MG tablet, Take 12.5 mg by mouth daily., Disp: , Rfl:     levothyroxine (SYNTHROID) 75 MCG tablet, Take 75 mcg by mouth daily before breakfast., Disp: , Rfl:    lidocaine (XYLOCAINE) 5 % ointment, Apply 1 Application topically 3 (three) times daily as needed for moderate pain. Apply to vulva as needed for discomfort, Disp: 35.44 g, Rfl: 0   Omega-3 Fatty Acids (FISH OIL) 1200 MG CAPS, Take 1,200 mg by mouth every evening., Disp: , Rfl:    rosuvastatin (CRESTOR) 5 MG tablet, Take 5 mg by mouth daily after supper., Disp: , Rfl:    silver sulfADIAZINE (SILVADENE) 1 % cream,  Apply 1 Application topically 2 (two) times daily. Apply to lasered area on the vulva, Disp: 50 g, Rfl: 0  Review of Systems: Denies appetite changes, fevers, chills, fatigue, unexplained weight changes. Denies hearing loss, neck lumps or masses, mouth sores, ringing in ears or voice changes. Denies cough or wheezing.  Denies shortness of breath. Denies chest pain or palpitations. Denies leg swelling. Denies abdominal distention, pain, blood in stools, constipation, diarrhea, nausea, vomiting, or early satiety. Denies hot flashes, pelvic pain, vaginal bleeding or vaginal discharge.   Denies joint pain, back pain or muscle pain/cramps. Denies rash, or wounds. Denies dizziness, headaches, numbness or seizures. Denies swollen lymph nodes or glands, denies easy bruising or bleeding. Denies anxiety, depression, confusion, or decreased concentration.  Physical Exam: BP 117/73 (BP Location: Left Arm, Patient Position: Sitting)   Pulse 82   Temp 98.2 F (36.8 C)   Resp 16   Ht 5\' 5"  (1.651 m)   Wt 161 lb 14.4 oz (73.4 kg)   SpO2 96%   BMI 26.94 kg/m  General: Alert, oriented, no acute distress. HEENT: Normocephalic, atraumatic, sclera anicteric. Chest: Unlabored breathing on room air.  GU: Overall well-healing vulva after laser.  Some patches of mildly erythematous tissue.  There is some agglutination anteriorly of the labia, closing the vaginal opening some.  The urethra is not  visible.  Topical lidocaine used and tissue gently separated with 1 digit.  Laboratory & Radiologic Studies: A. PERI-URETHRAL, RIGHT, BIOPSY: - HIGH-GRADE SQUAMOUS INTRAEPITHELIAL LESION (HSIL, VIN 3) - NEGATIVE FOR INVASIVE CARCINOMA - SEE COMMENT  B. LABIA, INNDER LEFT, 2 OC, BIOPSY: - HIGH-GRADE SQUAMOUS INTRAEPITHELIAL LESION (HSIL, VIN 3) - NEGATIVE FOR INVASIVE CARCINOMA - SEE COMMENT  C. SUPRA-URETHRAL, BIOPSY: - HIGH-GRADE SQUAMOUS INTRAEPITHELIAL LESION (HSIL, VIN 3) - NEGATIVE FOR INVASIVE CARCINOMA - SEE COMMENT  D. LABIA MINORA, 1 OC, BIOPSY: - HIGH-GRADE SQUAMOUS INTRAEPITHELIAL LESION (HSIL, VIN 3) - NEGATIVE FOR INVASIVE CARCINOMA - SEE COMMENT  E. FOURCHETTE, POSTERIOR, BIOPSY: - HIGH-GRADE SQUAMOUS INTRAEPITHELIAL LESION (HSIL, VIN 2) - NEGATIVE FOR INVASIVE CARCINOMA - SEE COMMENT  COMMENT: Parts A-E: Dr. Saralyn Pilar reviewed the case and agrees with the above diagnosis.  Clinical correlation recommended.   Assessment & Plan: [name] is a 82 y.o. 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  Last procedure in 04/2022 with biopsies showing VIN2-3, laser ablation performed.  Patient is overall doing well.  Had some agglutination of the anterior vulva, likely covering at least part of the urethra.  This was digitally lysed today.  Patient was given some topical lidocaine for home.  I have asked her to use Vaseline or other barrier during the daytime after she voids or wipes and to start using the estrogen every night.  Will see her back in approximately 3 weeks.  I have asked her to call either tomorrow or Monday to let me know if she is still having any difficulty urinating.  18 minutes of total time was spent for this patient encounter, including preparation, face-to-face counseling with the patient and coordination of care, and documentation of the encounter.  Jeral Pinch, MD  Division of Gynecologic Oncology  Department of Obstetrics and Gynecology  [facility]

## 2022-06-04 NOTE — Patient Instructions (Addendum)
It was good to see you today.  You are healing well after surgery.  Please give me an update either tomorrow or Monday to let me know if it is easier to urinate.  Use gel or Vaseline at the front side of the vaginal opening after every time you urinate or wash off.  Use the estrogen every night.  I will see you back for follow-up in about 3 weeks.  As always, if you develop any new and concerning symptoms before your next visit, please call to see me sooner.

## 2022-06-05 ENCOUNTER — Telehealth: Payer: Self-pay

## 2022-06-05 NOTE — Telephone Encounter (Signed)
[name] states that the pain with urination has decreased using a barrier ointment as Dr. Berline Lopes  recommended at her appointment yesterday 06-04-22. Advised her to call the office if the pain increases. Pt verbalized understanding.

## 2022-06-15 DIAGNOSIS — E782 Mixed hyperlipidemia: Secondary | ICD-10-CM | POA: Diagnosis not present

## 2022-06-15 DIAGNOSIS — I10 Essential (primary) hypertension: Secondary | ICD-10-CM | POA: Diagnosis not present

## 2022-06-15 DIAGNOSIS — E039 Hypothyroidism, unspecified: Secondary | ICD-10-CM | POA: Diagnosis not present

## 2022-06-18 DIAGNOSIS — E782 Mixed hyperlipidemia: Secondary | ICD-10-CM | POA: Diagnosis not present

## 2022-06-18 DIAGNOSIS — E039 Hypothyroidism, unspecified: Secondary | ICD-10-CM | POA: Diagnosis not present

## 2022-06-18 DIAGNOSIS — R7303 Prediabetes: Secondary | ICD-10-CM | POA: Diagnosis not present

## 2022-06-18 DIAGNOSIS — I10 Essential (primary) hypertension: Secondary | ICD-10-CM | POA: Diagnosis not present

## 2022-06-18 DIAGNOSIS — I728 Aneurysm of other specified arteries: Secondary | ICD-10-CM | POA: Diagnosis not present

## 2022-06-26 ENCOUNTER — Inpatient Hospital Stay: Payer: Medicare Other | Attending: Gynecologic Oncology | Admitting: Gynecologic Oncology

## 2022-06-26 ENCOUNTER — Other Ambulatory Visit: Payer: Self-pay

## 2022-06-26 VITALS — BP 130/73 | HR 93 | Temp 97.8°F | Wt 161.6 lb

## 2022-06-26 DIAGNOSIS — Z9079 Acquired absence of other genital organ(s): Secondary | ICD-10-CM | POA: Insufficient documentation

## 2022-06-26 DIAGNOSIS — Z86002 Personal history of in-situ neoplasm of other and unspecified genital organs: Secondary | ICD-10-CM | POA: Diagnosis not present

## 2022-06-26 DIAGNOSIS — Z8541 Personal history of malignant neoplasm of cervix uteri: Secondary | ICD-10-CM | POA: Diagnosis not present

## 2022-06-26 DIAGNOSIS — Z90710 Acquired absence of both cervix and uterus: Secondary | ICD-10-CM | POA: Diagnosis not present

## 2022-06-26 DIAGNOSIS — Q525 Fusion of labia: Secondary | ICD-10-CM | POA: Diagnosis present

## 2022-06-26 DIAGNOSIS — N900 Mild vulvar dysplasia: Secondary | ICD-10-CM

## 2022-06-26 DIAGNOSIS — Z9889 Other specified postprocedural states: Secondary | ICD-10-CM | POA: Insufficient documentation

## 2022-06-26 DIAGNOSIS — D071 Carcinoma in situ of vulva: Secondary | ICD-10-CM

## 2022-06-26 DIAGNOSIS — Z90722 Acquired absence of ovaries, bilateral: Secondary | ICD-10-CM | POA: Insufficient documentation

## 2022-06-26 DIAGNOSIS — N9089 Other specified noninflammatory disorders of vulva and perineum: Secondary | ICD-10-CM

## 2022-06-26 MED ORDER — PREMARIN 0.625 MG/GM VA CREA: TOPICAL_CREAM | VAGINAL | 3 refills | Status: DC

## 2022-06-26 NOTE — Patient Instructions (Addendum)
Continue with your regimen of applying Vaseline throughout the day to keep the labia from sticking together.   You can use the topical estrogen cream on the vulva three times a week to assist with healing.   You can use the topical lidocaine given to you today on the vulva for discomfort.  We will plan on seeing you in the office in three months or sooner if needed.  Please call for any new symptoms and we will make you an appointment to be seen sooner.

## 2022-07-01 NOTE — Progress Notes (Signed)
Gynecologic Oncology Return Clinic Visit  06/26/2022  Reason for Visit: follow-up from previous visit to evaluate vulvar agglutination  Treatment History: She had a palpable vulvar lump in the winter 2019 and 2020.  It became more noticeable in April 2020 and she attempted to have an appointment with a gynecologist however she could not get an appointment due to the the coronavirus shot downs.  She was eventually able to see a physician on August 25, 2018 at which time evaluation of the vulva confirmed a vulvar mass.  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 De Blanch.    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 09/27/18: Dr Duard Brady performed a radical anterior vulvectomy.  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  The resection margins were negative for carcinoma with the closest being the lateral margin 1:00 at 0.8 cm.  The carcinoma invaded 1.3 cm depth.  There was no lymphovascular or perineural invasion identified.   Postoperatively a PET/CT was obtained on October 06, 2018.  This revealed postoperative changes to the vulva.  There was no hypermetabolic local regional lymphadenopathy.  There is a questionable subtle focus of hypermetabolism in the left lobe of the liver, indeterminate for liver metastases.  MRI abdomen could be performed for further work-up.  No other potential findings of hypermetabolic distant metastatic disease were seen.   On 10/25/2018, the patient underwent bilateral superficial inguinal lymphadenectomy with fibroadipose tissue noted on the left and no carcinoma seen in 2 of 2 lymph nodes on the right.   The patient was seen in November with vulvar irritation that was intermittent in nature.  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  She was started on triamcinolone twice a day until her symptoms improved and then transition to daily.  I saw her at the end of December and she felt some improvement in her vulvar tenderness but still had significant erythema and irritation of the vulva.  I asked her to add Desitin or similar barrier cream.  When I saw her again in February, she had no improvement and had had significant irritation with the Desitin.  We stopped all topical treatment altogether.   ON 3/25, given persistent skin changes and no improvement in symptoms, vulvar biopsied was performed. Biopsy - high grade dysplasia.   06/15/19: partial simple left vulvectomy, partial simple peri-clitoral vulvectomy, vulvar biopsies, extensive CO2 laser ablation of the vulva. VIN3 noted in two vulvectomy specimens, positive margins of left specimen. Biopsies showed VIN1.    Treatment with vaginal/vulvar estrogen since surgery.   05/09/2020: Patient taken to the operating room for vulvar biopsies, lysis of vaginal agglutination, laser ablation of the vulva.  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   We used estrogen intermittently afterwards.   03/11/2021: Given continued vulvar erythema as well as desquamation, patient underwent EUA with vulvar biopsies and CO2 laser of the vulva.  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  Multiple biopsies showed high-grade vulvar dysplasia including vulvar biopsy at 1:00, 11:00, right periurethral, and left periclitoral.   03/25/2021: Given additional biopsies at her first surgery showing high-grade dysplasia that were not treated with laser ablation, additional CO2 laser of the vulva was recommended and performed.  Biopsies taken at the time of surgery showed focal  acute inflammation and reactive atypia in 3 of the biopsies.  Only the right vulva at 10:00 showed VIN 2-3.  04/22/22: vulvar biopsies, CO2 laser of the vulva  Interval  History: Doing well.  Intermittent vulvar soreness but this is improving. Feels urination has improved since last visit with no symptoms concerning for urethral obstruction. She feels she is emptying her bladder completely and feels her urine stream is normal. No new symptoms or concerns voiced.  Past Medical/Surgical History: Past Medical History:  Diagnosis Date   Celiac artery aneurysm (HCC) 2012   followed by vascular-- dr Myra Gianotti;  Theron Arista in epic 04-13-2022  stable no sig change since last visit   Complication of anesthesia    slow to wake up /  body temperature goes down   warm blankets with 1st surgery only   History of asthma    04-14-2022 per pt last had a rescue inhaler many yrs ago   History of cervical cancer 2008   s/p  TAH w/ BSO w/ pelvic lymphadenectomy for SCC of cervix   History of vaginal dysplasia    History of vulvar dysplasia 2020   with hx recurrence  s/p  bx's laser ablation and vulvectomy   Hyperlipidemia    Hypertension    Hypothyroidism    followed by pcp   Lichen sclerosus of vulva    PONV (postoperative nausea and vomiting)    NEEDS NAUSEA MED BEFORE SURGERY   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HCC)    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HCC)    Status post gamma knife treatment 03/2013   RIGHT V2  @ [facility] for trigeminal neuralgia    Past Surgical History:  Procedure Laterality Date   BREAST SURGERY Left    1970s  left breast cyst in areolar area   CATARACT EXTRACTION W/ INTRAOCULAR LENS IMPLANT Bilateral 2017   CO2 LASER APPLICATION N/A 06/15/2019   Procedure: C02 LASER OF VULVAR, VULVAR BIOPSIES,  SIMPLE VULVECTOMY;  Surgeon: Carver Fila, MD;  Location: [facility] [place];  Service: Gynecology;  Laterality: N/A;   CO2 LASER APPLICATION N/A 05/09/2020   Procedure: VULVAR  LASER APPLICATION;  Surgeon: Carver Fila, MD;  Location: [facility];  Service: Gynecology;  Laterality: N/A;   CO2 LASER APPLICATION N/A 03/11/2021    Procedure: CO2 LASER APPLICATION TO VULVA;  Surgeon: Carver Fila, MD;  Location: [facility];  Service: Gynecology;  Laterality: N/A;   CO2 LASER APPLICATION N/A 03/25/2021   Procedure: CO2 LASER APPLICATION TO VULVA;  Surgeon: Carver Fila, MD;  Location: [facility];  Service: Gynecology;  Laterality: N/A;   CO2 LASER APPLICATION N/A 04/22/2022   Procedure: CO2 LASER ABLATION TO VULVA;  Surgeon: Carver Fila, MD;  Location: [facility];  Service: Gynecology;  Laterality: N/A;   LYMPH NODE DISSECTION Bilateral 10/25/2018   Procedure: LYMPH NODE DISSECTION,BILATERAL INGUINOFEMORAL LYMPHADECTOMY;  Surgeon: Cleda Mccreedy, MD;  Location: [facility];  Service: Gynecology;  Laterality: Bilateral;   RADICAL VULVECTOMY N/A 09/27/2018   Procedure: RADICAL VULVECTOMY;  Surgeon: Cleda Mccreedy, MD;  Location: [facility];  Service: Gynecology;  Laterality: N/A;   RETROMASTOID CRANIOTOMY Right 08/08/2020   @ [facility];   MCV DECOMPRESSION TRIGEMINAL NERVE   TOTAL ABDOMINAL HYSTERECTOMY W/ BILATERAL SALPINGOOPHORECTOMY  04/27/2006   @ [facility] by dr Loree Fee;   and Pelvic lymphadenectomy / insertion suprapubic cath   VULVA /PERINEUM BIOPSY N/A 05/09/2020   Procedure: VULVAR BIOPSY;  Surgeon: Carver Fila, MD;  Location: [facility];  Service: Gynecology;  Laterality: N/A;   VULVA Ples Specter BIOPSY N/A 03/11/2021   Procedure: VULVAR BIOPSY;  Surgeon: Carver Fila, MD;  Location: [facility];  Service: Gynecology;  Laterality: N/A;   VULVA Ples Specter BIOPSY N/A 03/25/2021   Procedure: VULVAR BIOPSY;  Surgeon: Carver Fila, MD;  Location: [facility];  Service: Gynecology;  Laterality: N/A;   VULVA Ples Specter BIOPSY N/A 04/22/2022   Procedure: VULVAR BIOPSY;  Surgeon: Carver Fila, MD;  Location: [facility];  Service: Gynecology;  Laterality: N/A;    Family History   Problem Relation Age of Onset   Skin cancer Father     Social History   Socioeconomic History   Marital status: Widowed    Spouse name: Not on file   Number of children: Not on file   Years of education: Not on file   Highest education level: Not on file  Occupational History   Not on file  Tobacco Use   Smoking status: Former    Types: Cigarettes    Quit date: 12/08/2003    Years since quitting: 18.5    Passive exposure: Current   Smokeless tobacco: Never   Tobacco comments:    social  Vaping Use   Vaping Use: Never used  Substance and Sexual Activity   Alcohol use: No   Drug use: No   Sexual activity: Not Currently  Other Topics Concern   Not on file  Social History Narrative   Not on file   Social Determinants of Health   Financial Resource Strain: Not on file  Food Insecurity: Not on file  Transportation Needs: Not on file  Physical Activity: Not on file  Stress: Not on file  Social Connections: Not on file    Current Medications:  Current Outpatient Medications:    ampicillin (PRINCIPEN) 500 MG capsule, Take 1 capsule (500 mg total) by mouth 3 (three) times daily., Disp: 9 capsule, Rfl: 0   aspirin 81 MG chewable tablet, Chew 81 mg by mouth at bedtime., Disp: , Rfl:    Carboxymethylcellulose Sod PF 1 % GEL, Apply to eye 6 (six) times daily., Disp: , Rfl:    Cholecalciferol (VITAMIN D3) 50 MCG (2000 UT) TABS, Take 1 tablet by mouth every evening., Disp: , Rfl:    clobetasol ointment (TEMOVATE) 0.05 %, Apply 1 Application topically 2 (two) times daily. As instructed by your provider, Disp: 30 g, Rfl: 0   conjugated estrogens (PREMARIN) vaginal cream, APPLY DIME SIZE AMOUNT TO THE VULVA THREE TIMES A NIGHT, Disp: 30 g, Rfl: 3   hydrochlorothiazide (HYDRODIURIL) 25 MG tablet, Take 12.5 mg by mouth daily., Disp: , Rfl:    levothyroxine (SYNTHROID) 75 MCG tablet, Take 75 mcg by mouth daily before breakfast., Disp: , Rfl:    lidocaine (XYLOCAINE) 5 % ointment,  Apply 1 Application topically 3 (three) times daily as needed for moderate pain. Apply to vulva as needed for discomfort, Disp: 35.44 g, Rfl: 0   Omega-3 Fatty Acids (FISH OIL) 1200 MG CAPS, Take 1,200 mg by mouth every evening., Disp: , Rfl:    rosuvastatin (CRESTOR) 5 MG tablet, Take 5 mg by mouth daily after supper., Disp: , Rfl:    silver sulfADIAZINE (SILVADENE) 1 % cream, Apply 1 Application topically 2 (two) times daily. Apply to lasered area on the vulva, Disp: 50 g, Rfl: 0  Review of Systems: Denies appetite changes, fevers, chills, fatigue, unexplained weight changes. Denies  hearing loss, neck lumps or masses, mouth sores, ringing in ears or voice changes. Denies cough or wheezing.  Denies shortness of breath. Denies chest pain or palpitations. Denies leg swelling. Denies abdominal distention, pain, blood in stools, constipation, diarrhea, nausea, vomiting, or early satiety. Denies hot flashes, pelvic pain, vaginal bleeding or vaginal discharge.   Denies joint pain, back pain or muscle pain/cramps. Denies rash, or wounds. Denies dizziness, headaches, numbness or seizures. Denies swollen lymph nodes or glands, denies easy bruising or bleeding. Denies anxiety, depression, confusion, or decreased concentration.  Physical Exam: BP 130/73 (BP Location: Left Arm, Patient Position: Sitting)   Pulse 93   Temp 97.8 F (36.6 C) (Oral)   Wt 161 lb 9.6 oz (73.3 kg)   SpO2 98%   BMI 26.89 kg/m  General: Alert, oriented, no acute distress. HEENT: Normocephalic, atraumatic, sclera anicteric. Chest: Unlabored breathing on room air. GU: Overall well-healing vulva after laser.  Some patches of mildly erythematous tissue remains.  Agglutination anteriorly of the labia has improved from last assessment. The urethra is not visible with patient voicing soreness with manipulation of the labia/tissue.  Laboratory & Radiologic Studies: A. PERI-URETHRAL, RIGHT, BIOPSY: - HIGH-GRADE SQUAMOUS  INTRAEPITHELIAL LESION (HSIL, VIN 3) - NEGATIVE FOR INVASIVE CARCINOMA - SEE COMMENT  B. LABIA, INNDER LEFT, 2 OC, BIOPSY: - HIGH-GRADE SQUAMOUS INTRAEPITHELIAL LESION (HSIL, VIN 3) - NEGATIVE FOR INVASIVE CARCINOMA - SEE COMMENT  C. SUPRA-URETHRAL, BIOPSY: - HIGH-GRADE SQUAMOUS INTRAEPITHELIAL LESION (HSIL, VIN 3) - NEGATIVE FOR INVASIVE CARCINOMA - SEE COMMENT  D. LABIA MINORA, 1 OC, BIOPSY: - HIGH-GRADE SQUAMOUS INTRAEPITHELIAL LESION (HSIL, VIN 3) - NEGATIVE FOR INVASIVE CARCINOMA - SEE COMMENT  E. FOURCHETTE, POSTERIOR, BIOPSY: - HIGH-GRADE SQUAMOUS INTRAEPITHELIAL LESION (HSIL, VIN 2) - NEGATIVE FOR INVASIVE CARCINOMA - SEE COMMENT  COMMENT: Parts A-E: Dr. Luisa Hart reviewed the case and agrees with the above diagnosis.  Clinical correlation recommended.   Assessment & Plan: [name] is a 82 y.o. 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  Last procedure in 04/2022 with biopsies showing VIN2-3, laser ablation performed.  Patient is overall doing well.  Agglutination of the anterior vulva is improving. This was digitally lysed at her previous visit. She is advised to continue use of Vaseline throughout the day.  She is advised to begin use of the topical estrogen cream 3 times a week.  She is also advised she can use the topical lidocaine for discomfort as needed.  Follow-up has been arranged for 3 months but patient is advised to call for any persistent or worsening symptoms to be seen sooner.  [name] NP [facility] GYN Oncology

## 2022-08-06 DIAGNOSIS — E039 Hypothyroidism, unspecified: Secondary | ICD-10-CM | POA: Diagnosis not present

## 2022-09-03 ENCOUNTER — Ambulatory Visit: Payer: Medicare Other | Admitting: Gynecologic Oncology

## 2022-09-18 ENCOUNTER — Telehealth: Payer: Self-pay | Admitting: *Deleted

## 2022-09-18 DIAGNOSIS — D071 Carcinoma in situ of vulva: Secondary | ICD-10-CM

## 2022-09-18 MED ORDER — PREMARIN 0.625 MG/GM VA CREA: TOPICAL_CREAM | VAGINAL | 3 refills | Status: DC

## 2022-09-18 NOTE — Telephone Encounter (Signed)
Patient called and requested a refill on her Premarin Cream, pharmacy verified

## 2022-10-01 ENCOUNTER — Encounter: Payer: Self-pay | Admitting: Gynecologic Oncology

## 2022-10-01 ENCOUNTER — Inpatient Hospital Stay: Payer: Medicare Other | Admitting: Gynecologic Oncology

## 2022-10-01 VITALS — BP 123/69 | HR 78 | Temp 98.2°F | Resp 19 | Ht 65.0 in | Wt 166.4 lb

## 2022-10-01 DIAGNOSIS — Z8541 Personal history of malignant neoplasm of cervix uteri: Secondary | ICD-10-CM | POA: Insufficient documentation

## 2022-10-01 DIAGNOSIS — Z90722 Acquired absence of ovaries, bilateral: Secondary | ICD-10-CM | POA: Insufficient documentation

## 2022-10-01 DIAGNOSIS — D071 Carcinoma in situ of vulva: Secondary | ICD-10-CM | POA: Diagnosis present

## 2022-10-01 DIAGNOSIS — Z9079 Acquired absence of other genital organ(s): Secondary | ICD-10-CM | POA: Insufficient documentation

## 2022-10-01 DIAGNOSIS — Z90710 Acquired absence of both cervix and uterus: Secondary | ICD-10-CM | POA: Insufficient documentation

## 2022-10-01 DIAGNOSIS — N9089 Other specified noninflammatory disorders of vulva and perineum: Secondary | ICD-10-CM

## 2022-10-01 NOTE — H&P (View-Only) (Signed)
**Note Anne-Identified via Obfuscation**  Gynecologic Oncology Return Clinic Visit  10/01/22  Reason for Visit: follow-up after surgery, treatment planning   Treatment History: She had a palpable vulvar lump in the winter 2019 and 2020.  It became more noticeable in April 2020 and she attempted to have an appointment with a gynecologist however she could not get an appointment due to the the coronavirus shot downs.  She was eventually able to see a physician on August 25, 2018 at which time evaluation of the vulva confirmed a vulvar mass.  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 De [name].    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 09/27/18: Dr [name] performed a radical anterior vulvectomy.  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  The resection margins were negative for carcinoma with the closest being the lateral margin 1:00 at 0.8 cm.  The carcinoma invaded 1.3 cm depth.  There was no lymphovascular or perineural invasion identified.   Postoperatively a PET/CT was obtained on October 06, 2018.  This revealed postoperative changes to the vulva.  There was no hypermetabolic local regional lymphadenopathy.  There is a questionable subtle focus of hypermetabolism in the left lobe of the liver, indeterminate for liver metastases.  MRI abdomen could be performed for further work-up.  No other potential findings of hypermetabolic distant metastatic disease were seen.   On 10/25/2018, the patient underwent bilateral superficial inguinal lymphadenectomy with fibroadipose tissue noted on the left and no carcinoma seen in 2 of 2 lymph nodes on the right.   The patient was seen in November with vulvar irritation that was intermittent in nature.  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  She was started on triamcinolone twice a day until her symptoms improved and then transition to daily.  I saw her at the end of December and she felt some improvement in her vulvar tenderness but still had significant erythema and irritation of the vulva.  I asked her to add Desitin or similar barrier cream.  When I saw her again in February, she had no improvement and had had significant irritation with the Desitin.  We stopped all topical treatment altogether.   ON 3/25, given persistent skin changes and no improvement in symptoms, vulvar biopsied was performed. Biopsy - high grade dysplasia.   06/15/19: partial simple left vulvectomy, partial simple peri-clitoral vulvectomy, vulvar biopsies, extensive CO2 laser ablation of the vulva. VIN3 noted in two vulvectomy specimens, positive margins of left specimen. Biopsies showed VIN1.    Treatment with vaginal/vulvar estrogen since surgery.   05/09/2020: Patient taken to the operating room for vulvar biopsies, lysis of vaginal agglutination, laser ablation of the vulva.  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   We used estrogen intermittently afterwards.   03/11/2021: Given continued vulvar erythema as well as desquamation, patient underwent EUA with vulvar biopsies and CO2 laser of the vulva.  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  Multiple biopsies showed high-grade vulvar dysplasia including vulvar biopsy at 1:00, 11:00, right periurethral, and left periclitoral.   03/25/2021: Given additional biopsies at her first surgery showing high-grade dysplasia that were not treated with laser ablation, additional CO2 laser of the vulva was recommended and performed.  Biopsies taken at the time of surgery showed focal acute inflammation  and reactive atypia in 3 of the biopsies.  Only the right vulva at 10:00 showed VIN 2-3.   04/22/22: vulvar biopsies, CO2 laser of the vulva.  Biopsies showed high grade  dysplasia, no invasive carcinoma.  Interval History: Has had some more vulvar irritation especially in 1 spot along her left vulva, this occasionally bleeds.  Feels like she gets symptoms laded to vulvar dryness.  Denies any change to bowel or bladder function.  Past Medical/Surgical History: Past Medical History:  Diagnosis Date   Celiac artery aneurysm (HCC) 2012   followed by vascular-- dr [name];  [name] in epic 04-13-2022  stable no sig change since last visit   Complication of anesthesia    slow to wake up /  body temperature goes down   warm blankets with 1st surgery only   History of asthma    04-14-2022 per pt last had a rescue inhaler many yrs ago   History of cervical cancer 2008   s/p  TAH w/ BSO w/ pelvic lymphadenectomy for SCC of cervix   History of vaginal dysplasia    History of vulvar dysplasia 2020   with hx recurrence  s/p  bx's laser ablation and vulvectomy   Hyperlipidemia    Hypertension    Hypothyroidism    followed by pcp   Lichen sclerosus of vulva    PONV (postoperative nausea and vomiting)    NEEDS NAUSEA MED BEFORE SURGERY   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HCC)    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HCC)    Status post gamma knife treatment 03/2013   RIGHT V2  @ [facility] for trigeminal neuralgia    Past Surgical History:  Procedure Laterality Date   BREAST SURGERY Left    1970s  left breast cyst in areolar area   CATARACT EXTRACTION W/ INTRAOCULAR LENS IMPLANT Bilateral 2017   CO2 LASER APPLICATION N/A 06/15/2019   Procedure: C02 LASER OF VULVAR, VULVAR BIOPSIES,  SIMPLE VULVECTOMY;  Surgeon: Anne Fila, MD;  Location: [facility];  Service: Gynecology;  Laterality: N/A;   CO2 LASER APPLICATION N/A 05/09/2020   Procedure: VULVAR  LASER APPLICATION;  Surgeon: Anne Fila, MD;  Location: [facility];  Service: Gynecology;  Laterality: N/A;   CO2 LASER APPLICATION N/A 03/11/2021   Procedure: CO2 LASER APPLICATION TO VULVA;   Surgeon: Anne Fila, MD;  Location: [facility];  Service: Gynecology;  Laterality: N/A;   CO2 LASER APPLICATION N/A 03/25/2021   Procedure: CO2 LASER APPLICATION TO VULVA;  Surgeon: Anne Fila, MD;  Location: [facility];  Service: Gynecology;  Laterality: N/A;   CO2 LASER APPLICATION N/A 04/22/2022   Procedure: CO2 LASER ABLATION TO VULVA;  Surgeon: Anne Fila, MD;  Location: [facility];  Service: Gynecology;  Laterality: N/A;   LYMPH NODE DISSECTION Bilateral 10/25/2018   Procedure: LYMPH NODE DISSECTION,BILATERAL INGUINOFEMORAL LYMPHADECTOMY;  Surgeon: Cleda Mccreedy, MD;  Location: [facility];  Service: Gynecology;  Laterality: Bilateral;   RADICAL VULVECTOMY N/A 09/27/2018   Procedure: RADICAL VULVECTOMY;  Surgeon: Cleda Mccreedy, MD;  Location: [facility];  Service: Gynecology;  Laterality: N/A;   RETROMASTOID CRANIOTOMY Right 08/08/2020   @ [facility];   MCV DECOMPRESSION TRIGEMINAL NERVE   TOTAL ABDOMINAL HYSTERECTOMY W/ BILATERAL SALPINGOOPHORECTOMY  04/27/2006   @ [facility] by dr Loree Fee;   and Pelvic lymphadenectomy / insertion suprapubic cath   VULVA /PERINEUM BIOPSY N/A 05/09/2020   Procedure: VULVAR BIOPSY;  Surgeon: Anne Fila, MD;  Location: [facility];  Service: Gynecology;  Laterality: N/A;   VULVA Ples Specter BIOPSY N/A 03/11/2021   Procedure: VULVAR BIOPSY;  Surgeon: Anne Fila, MD;  Location: [facility];  Service: Gynecology;  Laterality: N/A;   VULVA Ples Specter BIOPSY N/A 03/25/2021   Procedure: VULVAR BIOPSY;  Surgeon: Anne Fila, MD;  Location: [facility];  Service: Gynecology;  Laterality: N/A;   VULVA Ples Specter BIOPSY N/A 04/22/2022   Procedure: VULVAR BIOPSY;  Surgeon: Anne Fila, MD;  Location: [facility];  Service: Gynecology;  Laterality: N/A;    Family History  Problem Relation Age of Onset   Skin cancer  Father     Social History   Socioeconomic History   Marital status: Widowed    Spouse name: Not on file   Number of children: Not on file   Years of education: Not on file   Highest education level: Not on file  Occupational History   Not on file  Tobacco Use   Smoking status: Former    Current packs/day: 0.00    Types: Cigarettes    Quit date: 12/08/2003    Years since quitting: 18.8    Passive exposure: Current   Smokeless tobacco: Never   Tobacco comments:    social  Vaping Use   Vaping status: Never Used  Substance and Sexual Activity   Alcohol use: No   Drug use: No   Sexual activity: Not Currently  Other Topics Concern   Not on file  Social History Narrative   Not on file   Social Determinants of Health   Financial Resource Strain: Not on file  Food Insecurity: Not on file  Transportation Needs: Not on file  Physical Activity: Not on file  Stress: Not on file  Social Connections: Not on file    Current Medications:  Current Outpatient Medications:    aspirin 81 MG chewable tablet, Chew 81 mg by mouth at bedtime., Disp: , Rfl:    Carboxymethylcellulose Sod PF 1 % GEL, Apply to eye 6 (six) times daily., Disp: , Rfl:    Cholecalciferol (VITAMIN D3) 50 MCG (2000 UT) TABS, Take 1 tablet by mouth every evening., Disp: , Rfl:    clobetasol ointment (TEMOVATE) 0.05 %, Apply 1 Application topically 2 (two) times daily. As instructed by your provider, Disp: 30 g, Rfl: 0   conjugated estrogens (PREMARIN) vaginal cream, APPLY DIME SIZE AMOUNT TO THE VULVA THREE TIMES A NIGHT, Disp: 30 g, Rfl: 3   hydrochlorothiazide (HYDRODIURIL) 25 MG tablet, Take 12.5 mg by mouth daily., Disp: , Rfl:    levothyroxine (SYNTHROID) 75 MCG tablet, Take 75 mcg by mouth daily before breakfast., Disp: , Rfl:    Omega-3 Fatty Acids (FISH OIL) 1200 MG CAPS, Take 1,200 mg by mouth every evening., Disp: , Rfl:    rosuvastatin (CRESTOR) 5 MG tablet, Take 5 mg by mouth daily after supper., Disp:  , Rfl:    silver sulfADIAZINE (SILVADENE) 1 % cream, Apply 1 Application topically 2 (two) times daily. Apply to lasered area on the vulva, Disp: 50 g, Rfl: 0  Review of Systems: Denies appetite changes, fevers, chills, fatigue, unexplained weight changes. Denies hearing loss, neck lumps or masses, mouth sores, ringing in ears or voice changes. Denies cough or wheezing.  Denies shortness of breath. Denies chest pain or palpitations. Denies leg swelling. Denies abdominal distention, pain, blood in stools, constipation, diarrhea, nausea, vomiting, or early satiety. Denies pain with intercourse, dysuria,  frequency, hematuria or incontinence. Denies hot flashes, pelvic pain, vaginal bleeding or vaginal discharge.   Denies joint pain, back pain or muscle pain/cramps. Denies itching, rash, or wounds. Denies dizziness, headaches, numbness or seizures. Denies swollen lymph nodes or glands, denies easy bruising or bleeding. Denies anxiety, depression, confusion, or decreased concentration.  Physical Exam: BP 123/69 (BP Location: Right Arm, Patient Position: Sitting)   Pulse 78   Temp 98.2 F (36.8 C) (Oral)   Resp 19   Ht 5\' 5"  (1.651 m)   Wt 166 lb 6 oz (75.5 kg)   SpO2 95%   BMI 27.69 kg/m  General: Alert, oriented, no acute distress. HEENT: Normocephalic, atraumatic, sclera anicteric. Chest: Unlabored breathing on room air.   GU: Picture taken and in media.  The patient has some mild erythema, more on the anterior left vulva than the right with persistent agglutination although better than the last time I saw her.  There is an approximately 1-1.5 cm raised and somewhat frondular looking lesion at the lateral aspect of the erythema on the left vulva at 4:00 (biopsied today).  Two similar-appearing lesions although much smaller (< 5mm) anteriorly at 11-12 o'clock.  Vulvar biopsy procedure Preoperative diagnosis: vulvar lesion Postoperative diagnosis: Same as above Physician: Pricilla Holm  MD Estimated blood loss: Minimal Specimens: left vulva, 4 o'c Procedure: After the procedure was discussed with the patient including risks and benefits, she gave verbal consent.  She was already in dorsolithotomy position after vulvar inspection.  The area to be biopsied was cleansed with Betadine x 3.  2 cc of 2% lidocaine was injected for local anesthesia.  3 mm biopsy punch was used to perform the biopsy.  Given difficulty with holding the biopsy with the pickups, Tischler forceps were ultimately used to take the biopsy and remove it.  Biopsy was placed in formalin.  Combination of pressure and silver nitrate was used to achieve hemostasis. Overall the patient tolerated the procedure well.    Laboratory & Radiologic Studies: None new  Assessment & Plan: [name] is a 82 y.o. woman with a history of stage IB vulvar cancer treated surgically in 2020 now with recurrent and ongoing vaginal and vulvar dysplasia.  Last procedure in 04/2022 with biopsies showing VIN2-3, laser ablation performed.   Patient has several lesions on her vulva, largest biopsied today.  I am concerned that these represent dysplasia or malignancy although they certainly could have been related to granulation tissue from healing after her last laser procedure.  I will call her with biopsy results.  Given symptoms, I suspect that we will be discussing procedure to excise these areas in the operating room.  20 minutes of total time was spent for this patient encounter, including preparation, face-to-face counseling with the patient and coordination of care, and documentation of the encounter.  Eugene Garnet, MD  Division of Gynecologic Oncology  Department of Obstetrics and Gynecology  [facility]

## 2022-10-01 NOTE — Patient Instructions (Addendum)
It was good to see you today.  I will call you with your biopsy results, either tomorrow or next week.

## 2022-10-01 NOTE — H&P (View-Only) (Signed)
Gynecologic Oncology Return Clinic Visit  10/01/22  Reason for Visit: follow-up after surgery, treatment planning   Treatment History: She had a palpable vulvar lump in the winter 2019 and 2020.  It became more noticeable in April 2020 and she attempted to have an appointment with a gynecologist however she could not get an appointment due to the the coronavirus shot downs.  She was eventually able to see a physician on August 25, 2018 at which time evaluation of the vulva confirmed a vulvar mass.  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 De Blanch.    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 09/27/18: Dr Duard Brady performed a radical anterior vulvectomy.  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  The resection margins were negative for carcinoma with the closest being the lateral margin 1:00 at 0.8 cm.  The carcinoma invaded 1.3 cm depth.  There was no lymphovascular or perineural invasion identified.   Postoperatively a PET/CT was obtained on October 06, 2018.  This revealed postoperative changes to the vulva.  There was no hypermetabolic local regional lymphadenopathy.  There is a questionable subtle focus of hypermetabolism in the left lobe of the liver, indeterminate for liver metastases.  MRI abdomen could be performed for further work-up.  No other potential findings of hypermetabolic distant metastatic disease were seen.   On 10/25/2018, the patient underwent bilateral superficial inguinal lymphadenectomy with fibroadipose tissue noted on the left and no carcinoma seen in 2 of 2 lymph nodes on the right.   The patient was seen in November with vulvar irritation that was intermittent in nature.  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  She was started on triamcinolone twice a day until her symptoms improved and then transition to daily.  I saw her at the end of December and she felt some improvement in her vulvar tenderness but still had significant erythema and irritation of the vulva.  I asked her to add Desitin or similar barrier cream.  When I saw her again in February, she had no improvement and had had significant irritation with the Desitin.  We stopped all topical treatment altogether.   ON 3/25, given persistent skin changes and no improvement in symptoms, vulvar biopsied was performed. Biopsy - high grade dysplasia.   06/15/19: partial simple left vulvectomy, partial simple peri-clitoral vulvectomy, vulvar biopsies, extensive CO2 laser ablation of the vulva. VIN3 noted in two vulvectomy specimens, positive margins of left specimen. Biopsies showed VIN1.    Treatment with vaginal/vulvar estrogen since surgery.   05/09/2020: Patient taken to the operating room for vulvar biopsies, lysis of vaginal agglutination, laser ablation of the vulva.  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   We used estrogen intermittently afterwards.   03/11/2021: Given continued vulvar erythema as well as desquamation, patient underwent EUA with vulvar biopsies and CO2 laser of the vulva.  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  Multiple biopsies showed high-grade vulvar dysplasia including vulvar biopsy at 1:00, 11:00, right periurethral, and left periclitoral.   03/25/2021: Given additional biopsies at her first surgery showing high-grade dysplasia that were not treated with laser ablation, additional CO2 laser of the vulva was recommended and performed.  Biopsies taken at the time of surgery showed focal acute inflammation  and reactive atypia in 3 of the biopsies.  Only the right vulva at 10:00 showed VIN 2-3.   04/22/22: vulvar biopsies, CO2 laser of the vulva.  Biopsies showed high grade  dysplasia, no invasive carcinoma.  Interval History: Has had some more vulvar irritation especially in 1 spot along her left vulva, this occasionally bleeds.  Feels like she gets symptoms laded to vulvar dryness.  Denies any change to bowel or bladder function.  Past Medical/Surgical History: Past Medical History:  Diagnosis Date   Celiac artery aneurysm (HCC) 2012   followed by vascular-- dr Myra Gianotti;  Theron Arista in epic 04-13-2022  stable no sig change since last visit   Complication of anesthesia    slow to wake up /  body temperature goes down   warm blankets with 1st surgery only   History of asthma    04-14-2022 per pt last had a rescue inhaler many yrs ago   History of cervical cancer 2008   s/p  TAH w/ BSO w/ pelvic lymphadenectomy for SCC of cervix   History of vaginal dysplasia    History of vulvar dysplasia 2020   with hx recurrence  s/p  bx's laser ablation and vulvectomy   Hyperlipidemia    Hypertension    Hypothyroidism    followed by pcp   Lichen sclerosus of vulva    PONV (postoperative nausea and vomiting)    NEEDS NAUSEA MED BEFORE SURGERY   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HCC)    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HCC)    Status post gamma knife treatment 03/2013   RIGHT V2  @ [facility] for trigeminal neuralgia    Past Surgical History:  Procedure Laterality Date   BREAST SURGERY Left    1970s  left breast cyst in areolar area   CATARACT EXTRACTION W/ INTRAOCULAR LENS IMPLANT Bilateral 2017   CO2 LASER APPLICATION N/A 06/15/2019   Procedure: C02 LASER OF VULVAR, VULVAR BIOPSIES,  SIMPLE VULVECTOMY;  Surgeon: Carver Fila, MD;  Location: [facility];  Service: Gynecology;  Laterality: N/A;   CO2 LASER APPLICATION N/A 05/09/2020   Procedure: VULVAR  LASER APPLICATION;  Surgeon: Carver Fila, MD;  Location: [facility];  Service: Gynecology;  Laterality: N/A;   CO2 LASER APPLICATION N/A 03/11/2021   Procedure: CO2 LASER APPLICATION TO VULVA;   Surgeon: Carver Fila, MD;  Location: [facility];  Service: Gynecology;  Laterality: N/A;   CO2 LASER APPLICATION N/A 03/25/2021   Procedure: CO2 LASER APPLICATION TO VULVA;  Surgeon: Carver Fila, MD;  Location: [facility];  Service: Gynecology;  Laterality: N/A;   CO2 LASER APPLICATION N/A 04/22/2022   Procedure: CO2 LASER ABLATION TO VULVA;  Surgeon: Carver Fila, MD;  Location: [facility];  Service: Gynecology;  Laterality: N/A;   LYMPH NODE DISSECTION Bilateral 10/25/2018   Procedure: LYMPH NODE DISSECTION,BILATERAL INGUINOFEMORAL LYMPHADECTOMY;  Surgeon: Cleda Mccreedy, MD;  Location: [facility];  Service: Gynecology;  Laterality: Bilateral;   RADICAL VULVECTOMY N/A 09/27/2018   Procedure: RADICAL VULVECTOMY;  Surgeon: Cleda Mccreedy, MD;  Location: [facility];  Service: Gynecology;  Laterality: N/A;   RETROMASTOID CRANIOTOMY Right 08/08/2020   @ [facility];   MCV DECOMPRESSION TRIGEMINAL NERVE   TOTAL ABDOMINAL HYSTERECTOMY W/ BILATERAL SALPINGOOPHORECTOMY  04/27/2006   @ [facility] by dr Loree Fee;   and Pelvic lymphadenectomy / insertion suprapubic cath   VULVA /PERINEUM BIOPSY N/A 05/09/2020   Procedure: VULVAR BIOPSY;  Surgeon: Carver Fila, MD;  Location: [facility];  Service: Gynecology;  Laterality: N/A;   VULVA Ples Specter BIOPSY N/A 03/11/2021   Procedure: VULVAR BIOPSY;  Surgeon: Carver Fila, MD;  Location: [facility];  Service: Gynecology;  Laterality: N/A;   VULVA Ples Specter BIOPSY N/A 03/25/2021   Procedure: VULVAR BIOPSY;  Surgeon: Carver Fila, MD;  Location: [facility];  Service: Gynecology;  Laterality: N/A;   VULVA Ples Specter BIOPSY N/A 04/22/2022   Procedure: VULVAR BIOPSY;  Surgeon: Carver Fila, MD;  Location: [facility];  Service: Gynecology;  Laterality: N/A;    Family History  Problem Relation Age of Onset   Skin cancer  Father     Social History   Socioeconomic History   Marital status: Widowed    Spouse name: Not on file   Number of children: Not on file   Years of education: Not on file   Highest education level: Not on file  Occupational History   Not on file  Tobacco Use   Smoking status: Former    Current packs/day: 0.00    Types: Cigarettes    Quit date: 12/08/2003    Years since quitting: 18.8    Passive exposure: Current   Smokeless tobacco: Never   Tobacco comments:    social  Vaping Use   Vaping status: Never Used  Substance and Sexual Activity   Alcohol use: No   Drug use: No   Sexual activity: Not Currently  Other Topics Concern   Not on file  Social History Narrative   Not on file   Social Determinants of Health   Financial Resource Strain: Not on file  Food Insecurity: Not on file  Transportation Needs: Not on file  Physical Activity: Not on file  Stress: Not on file  Social Connections: Not on file    Current Medications:  Current Outpatient Medications:    aspirin 81 MG chewable tablet, Chew 81 mg by mouth at bedtime., Disp: , Rfl:    Carboxymethylcellulose Sod PF 1 % GEL, Apply to eye 6 (six) times daily., Disp: , Rfl:    Cholecalciferol (VITAMIN D3) 50 MCG (2000 UT) TABS, Take 1 tablet by mouth every evening., Disp: , Rfl:    clobetasol ointment (TEMOVATE) 0.05 %, Apply 1 Application topically 2 (two) times daily. As instructed by your provider, Disp: 30 g, Rfl: 0   conjugated estrogens (PREMARIN) vaginal cream, APPLY DIME SIZE AMOUNT TO THE VULVA THREE TIMES A NIGHT, Disp: 30 g, Rfl: 3   hydrochlorothiazide (HYDRODIURIL) 25 MG tablet, Take 12.5 mg by mouth daily., Disp: , Rfl:    levothyroxine (SYNTHROID) 75 MCG tablet, Take 75 mcg by mouth daily before breakfast., Disp: , Rfl:    Omega-3 Fatty Acids (FISH OIL) 1200 MG CAPS, Take 1,200 mg by mouth every evening., Disp: , Rfl:    rosuvastatin (CRESTOR) 5 MG tablet, Take 5 mg by mouth daily after supper., Disp:  , Rfl:    silver sulfADIAZINE (SILVADENE) 1 % cream, Apply 1 Application topically 2 (two) times daily. Apply to lasered area on the vulva, Disp: 50 g, Rfl: 0  Review of Systems: Denies appetite changes, fevers, chills, fatigue, unexplained weight changes. Denies hearing loss, neck lumps or masses, mouth sores, ringing in ears or voice changes. Denies cough or wheezing.  Denies shortness of breath. Denies chest pain or palpitations. Denies leg swelling. Denies abdominal distention, pain, blood in stools, constipation, diarrhea, nausea, vomiting, or early satiety. Denies pain with intercourse, dysuria,  frequency, hematuria or incontinence. Denies hot flashes, pelvic pain, vaginal bleeding or vaginal discharge.   Denies joint pain, back pain or muscle pain/cramps. Denies itching, rash, or wounds. Denies dizziness, headaches, numbness or seizures. Denies swollen lymph nodes or glands, denies easy bruising or bleeding. Denies anxiety, depression, confusion, or decreased concentration.  Physical Exam: BP 123/69 (BP Location: Right Arm, Patient Position: Sitting)   Pulse 78   Temp 98.2 F (36.8 C) (Oral)   Resp 19   Ht 5\' 5"  (1.651 m)   Wt 166 lb 6 oz (75.5 kg)   SpO2 95%   BMI 27.69 kg/m  General: Alert, oriented, no acute distress. HEENT: Normocephalic, atraumatic, sclera anicteric. Chest: Unlabored breathing on room air.   GU: Picture taken and in media.  The patient has some mild erythema, more on the anterior left vulva than the right with persistent agglutination although better than the last time I saw her.  There is an approximately 1-1.5 cm raised and somewhat frondular looking lesion at the lateral aspect of the erythema on the left vulva at 4:00 (biopsied today).  Two similar-appearing lesions although much smaller (< 5mm) anteriorly at 11-12 o'clock.  Vulvar biopsy procedure Preoperative diagnosis: vulvar lesion Postoperative diagnosis: Same as above Physician: Pricilla Holm  MD Estimated blood loss: Minimal Specimens: left vulva, 4 o'c Procedure: After the procedure was discussed with the patient including risks and benefits, she gave verbal consent.  She was already in dorsolithotomy position after vulvar inspection.  The area to be biopsied was cleansed with Betadine x 3.  2 cc of 2% lidocaine was injected for local anesthesia.  3 mm biopsy punch was used to perform the biopsy.  Given difficulty with holding the biopsy with the pickups, Tischler forceps were ultimately used to take the biopsy and remove it.  Biopsy was placed in formalin.  Combination of pressure and silver nitrate was used to achieve hemostasis. Overall the patient tolerated the procedure well.    Laboratory & Radiologic Studies: None new  Assessment & Plan: Sirenity Shew is a 82 y.o. woman with a history of stage IB vulvar cancer treated surgically in 2020 now with recurrent and ongoing vaginal and vulvar dysplasia.  Last procedure in 04/2022 with biopsies showing VIN2-3, laser ablation performed.   Patient has several lesions on her vulva, largest biopsied today.  I am concerned that these represent dysplasia or malignancy although they certainly could have been related to granulation tissue from healing after her last laser procedure.  I will call her with biopsy results.  Given symptoms, I suspect that we will be discussing procedure to excise these areas in the operating room.  20 minutes of total time was spent for this patient encounter, including preparation, face-to-face counseling with the patient and coordination of care, and documentation of the encounter.  Eugene Garnet, MD  Division of Gynecologic Oncology  Department of Obstetrics and Gynecology  [facility]

## 2022-10-01 NOTE — Progress Notes (Signed)
Gynecologic Oncology Return Clinic Visit  10/01/22  Reason for Visit: follow-up after surgery, treatment planning   Treatment History: She had a palpable vulvar lump in the winter 2019 and 2020.  It became more noticeable in April 2020 and she attempted to have an appointment with a gynecologist however she could not get an appointment due to the the coronavirus shot downs.  She was eventually able to see a physician on August 25, 2018 at which time evaluation of the vulva confirmed a vulvar mass.  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 De Blanch.    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 09/27/18: Dr Duard Brady performed a radical anterior vulvectomy.  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  The resection margins were negative for carcinoma with the closest being the lateral margin 1:00 at 0.8 cm.  The carcinoma invaded 1.3 cm depth.  There was no lymphovascular or perineural invasion identified.   Postoperatively a PET/CT was obtained on October 06, 2018.  This revealed postoperative changes to the vulva.  There was no hypermetabolic local regional lymphadenopathy.  There is a questionable subtle focus of hypermetabolism in the left lobe of the liver, indeterminate for liver metastases.  MRI abdomen could be performed for further work-up.  No other potential findings of hypermetabolic distant metastatic disease were seen.   On 10/25/2018, the patient underwent bilateral superficial inguinal lymphadenectomy with fibroadipose tissue noted on the left and no carcinoma seen in 2 of 2 lymph nodes on the right.   The patient was seen in November with vulvar irritation that was intermittent in nature.  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  She was started on triamcinolone twice a day until her symptoms improved and then transition to daily.  I saw her at the end of December and she felt some improvement in her vulvar tenderness but still had significant erythema and irritation of the vulva.  I asked her to add Desitin or similar barrier cream.  When I saw her again in February, she had no improvement and had had significant irritation with the Desitin.  We stopped all topical treatment altogether.   ON 3/25, given persistent skin changes and no improvement in symptoms, vulvar biopsied was performed. Biopsy - high grade dysplasia.   06/15/19: partial simple left vulvectomy, partial simple peri-clitoral vulvectomy, vulvar biopsies, extensive CO2 laser ablation of the vulva. VIN3 noted in two vulvectomy specimens, positive margins of left specimen. Biopsies showed VIN1.    Treatment with vaginal/vulvar estrogen since surgery.   05/09/2020: Patient taken to the operating room for vulvar biopsies, lysis of vaginal agglutination, laser ablation of the vulva.  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   We used estrogen intermittently afterwards.   03/11/2021: Given continued vulvar erythema as well as desquamation, patient underwent EUA with vulvar biopsies and CO2 laser of the vulva.  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  Multiple biopsies showed high-grade vulvar dysplasia including vulvar biopsy at 1:00, 11:00, right periurethral, and left periclitoral.   03/25/2021: Given additional biopsies at her first surgery showing high-grade dysplasia that were not treated with laser ablation, additional CO2 laser of the vulva was recommended and performed.  Biopsies taken at the time of surgery showed focal acute inflammation  and reactive atypia in 3 of the biopsies.  Only the right vulva at 10:00 showed VIN 2-3.   04/22/22: vulvar biopsies, CO2 laser of the vulva.  Biopsies showed high grade  dysplasia, no invasive carcinoma.  Interval History: Has had some more vulvar irritation especially in 1 spot along her left vulva, this occasionally bleeds.  Feels like she gets symptoms laded to vulvar dryness.  Denies any change to bowel or bladder function.  Past Medical/Surgical History: Past Medical History:  Diagnosis Date   Celiac artery aneurysm (HCC) 2012   followed by vascular-- dr Myra Gianotti;  Theron Arista in epic 04-13-2022  stable no sig change since last visit   Complication of anesthesia    slow to wake up /  body temperature goes down   warm blankets with 1st surgery only   History of asthma    04-14-2022 per pt last had a rescue inhaler many yrs ago   History of cervical cancer 2008   s/p  TAH w/ BSO w/ pelvic lymphadenectomy for SCC of cervix   History of vaginal dysplasia    History of vulvar dysplasia 2020   with hx recurrence  s/p  bx's laser ablation and vulvectomy   Hyperlipidemia    Hypertension    Hypothyroidism    followed by pcp   Lichen sclerosus of vulva    PONV (postoperative nausea and vomiting)    NEEDS NAUSEA MED BEFORE SURGERY   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HCC)    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HCC)    Status post gamma knife treatment 03/2013   RIGHT V2  @ [facility] for trigeminal neuralgia    Past Surgical History:  Procedure Laterality Date   BREAST SURGERY Left    1970s  left breast cyst in areolar area   CATARACT EXTRACTION W/ INTRAOCULAR LENS IMPLANT Bilateral 2017   CO2 LASER APPLICATION N/A 06/15/2019   Procedure: C02 LASER OF VULVAR, VULVAR BIOPSIES,  SIMPLE VULVECTOMY;  Surgeon: Carver Fila, MD;  Location: [facility];  Service: Gynecology;  Laterality: N/A;   CO2 LASER APPLICATION N/A 05/09/2020   Procedure: VULVAR  LASER APPLICATION;  Surgeon: Carver Fila, MD;  Location: [facility];  Service: Gynecology;  Laterality: N/A;   CO2 LASER APPLICATION N/A 03/11/2021   Procedure: CO2 LASER APPLICATION TO VULVA;   Surgeon: Carver Fila, MD;  Location: [facility];  Service: Gynecology;  Laterality: N/A;   CO2 LASER APPLICATION N/A 03/25/2021   Procedure: CO2 LASER APPLICATION TO VULVA;  Surgeon: Carver Fila, MD;  Location: [facility];  Service: Gynecology;  Laterality: N/A;   CO2 LASER APPLICATION N/A 04/22/2022   Procedure: CO2 LASER ABLATION TO VULVA;  Surgeon: Carver Fila, MD;  Location: [facility];  Service: Gynecology;  Laterality: N/A;   LYMPH NODE DISSECTION Bilateral 10/25/2018   Procedure: LYMPH NODE DISSECTION,BILATERAL INGUINOFEMORAL LYMPHADECTOMY;  Surgeon: Cleda Mccreedy, MD;  Location: [facility];  Service: Gynecology;  Laterality: Bilateral;   RADICAL VULVECTOMY N/A 09/27/2018   Procedure: RADICAL VULVECTOMY;  Surgeon: Cleda Mccreedy, MD;  Location: [facility];  Service: Gynecology;  Laterality: N/A;   RETROMASTOID CRANIOTOMY Right 08/08/2020   @ [facility];   MCV DECOMPRESSION TRIGEMINAL NERVE   TOTAL ABDOMINAL HYSTERECTOMY W/ BILATERAL SALPINGOOPHORECTOMY  04/27/2006   @ [facility] by dr Loree Fee;   and Pelvic lymphadenectomy / insertion suprapubic cath   VULVA /PERINEUM BIOPSY N/A 05/09/2020   Procedure: VULVAR BIOPSY;  Surgeon: Carver Fila, MD;  Location: [facility];  Service: Gynecology;  Laterality: N/A;   VULVA Ples Specter BIOPSY N/A 03/11/2021   Procedure: VULVAR BIOPSY;  Surgeon: Carver Fila, MD;  Location: [facility];  Service: Gynecology;  Laterality: N/A;   VULVA Ples Specter BIOPSY N/A 03/25/2021   Procedure: VULVAR BIOPSY;  Surgeon: Carver Fila, MD;  Location: [facility];  Service: Gynecology;  Laterality: N/A;   VULVA Ples Specter BIOPSY N/A 04/22/2022   Procedure: VULVAR BIOPSY;  Surgeon: Carver Fila, MD;  Location: [facility];  Service: Gynecology;  Laterality: N/A;    Family History  Problem Relation Age of Onset   Skin cancer  Father     Social History   Socioeconomic History   Marital status: Widowed    Spouse name: Not on file   Number of children: Not on file   Years of education: Not on file   Highest education level: Not on file  Occupational History   Not on file  Tobacco Use   Smoking status: Former    Current packs/day: 0.00    Types: Cigarettes    Quit date: 12/08/2003    Years since quitting: 18.8    Passive exposure: Current   Smokeless tobacco: Never   Tobacco comments:    social  Vaping Use   Vaping status: Never Used  Substance and Sexual Activity   Alcohol use: No   Drug use: No   Sexual activity: Not Currently  Other Topics Concern   Not on file  Social History Narrative   Not on file   Social Determinants of Health   Financial Resource Strain: Not on file  Food Insecurity: Not on file  Transportation Needs: Not on file  Physical Activity: Not on file  Stress: Not on file  Social Connections: Not on file    Current Medications:  Current Outpatient Medications:    aspirin 81 MG chewable tablet, Chew 81 mg by mouth at bedtime., Disp: , Rfl:    Carboxymethylcellulose Sod PF 1 % GEL, Apply to eye 6 (six) times daily., Disp: , Rfl:    Cholecalciferol (VITAMIN D3) 50 MCG (2000 UT) TABS, Take 1 tablet by mouth every evening., Disp: , Rfl:    clobetasol ointment (TEMOVATE) 0.05 %, Apply 1 Application topically 2 (two) times daily. As instructed by your provider, Disp: 30 g, Rfl: 0   conjugated estrogens (PREMARIN) vaginal cream, APPLY DIME SIZE AMOUNT TO THE VULVA THREE TIMES A NIGHT, Disp: 30 g, Rfl: 3   hydrochlorothiazide (HYDRODIURIL) 25 MG tablet, Take 12.5 mg by mouth daily., Disp: , Rfl:    levothyroxine (SYNTHROID) 75 MCG tablet, Take 75 mcg by mouth daily before breakfast., Disp: , Rfl:    Omega-3 Fatty Acids (FISH OIL) 1200 MG CAPS, Take 1,200 mg by mouth every evening., Disp: , Rfl:    rosuvastatin (CRESTOR) 5 MG tablet, Take 5 mg by mouth daily after supper., Disp:  , Rfl:    silver sulfADIAZINE (SILVADENE) 1 % cream, Apply 1 Application topically 2 (two) times daily. Apply to lasered area on the vulva, Disp: 50 g, Rfl: 0  Review of Systems: Denies appetite changes, fevers, chills, fatigue, unexplained weight changes. Denies hearing loss, neck lumps or masses, mouth sores, ringing in ears or voice changes. Denies cough or wheezing.  Denies shortness of breath. Denies chest pain or palpitations. Denies leg swelling. Denies abdominal distention, pain, blood in stools, constipation, diarrhea, nausea, vomiting, or early satiety. Denies pain with intercourse, dysuria,  frequency, hematuria or incontinence. Denies hot flashes, pelvic pain, vaginal bleeding or vaginal discharge.   Denies joint pain, back pain or muscle pain/cramps. Denies itching, rash, or wounds. Denies dizziness, headaches, numbness or seizures. Denies swollen lymph nodes or glands, denies easy bruising or bleeding. Denies anxiety, depression, confusion, or decreased concentration.  Physical Exam: BP 123/69 (BP Location: Right Arm, Patient Position: Sitting)   Pulse 78   Temp 98.2 F (36.8 C) (Oral)   Resp 19   Ht 5\' 5"  (1.651 m)   Wt 166 lb 6 oz (75.5 kg)   SpO2 95%   BMI 27.69 kg/m  General: Alert, oriented, no acute distress. HEENT: Normocephalic, atraumatic, sclera anicteric. Chest: Unlabored breathing on room air.   GU: Picture taken and in media.  The patient has some mild erythema, more on the anterior left vulva than the right with persistent agglutination although better than the last time I saw her.  There is an approximately 1-1.5 cm raised and somewhat frondular looking lesion at the lateral aspect of the erythema on the left vulva at 4:00 (biopsied today).  Two similar-appearing lesions although much smaller (< 5mm) anteriorly at 11-12 o'clock.  Vulvar biopsy procedure Preoperative diagnosis: vulvar lesion Postoperative diagnosis: Same as above Physician: Pricilla Holm  MD Estimated blood loss: Minimal Specimens: left vulva, 4 o'c Procedure: After the procedure was discussed with the patient including risks and benefits, she gave verbal consent.  She was already in dorsolithotomy position after vulvar inspection.  The area to be biopsied was cleansed with Betadine x 3.  2 cc of 2% lidocaine was injected for local anesthesia.  3 mm biopsy punch was used to perform the biopsy.  Given difficulty with holding the biopsy with the pickups, Tischler forceps were ultimately used to take the biopsy and remove it.  Biopsy was placed in formalin.  Combination of pressure and silver nitrate was used to achieve hemostasis. Overall the patient tolerated the procedure well.    Laboratory & Radiologic Studies: None new  Assessment & Plan: [name] is a 82 y.o. woman with a history of stage IB vulvar cancer treated surgically in 2020 now with recurrent and ongoing vaginal and vulvar dysplasia.  Last procedure in 04/2022 with biopsies showing VIN2-3, laser ablation performed.   Patient has several lesions on her vulva, largest biopsied today.  I am concerned that these represent dysplasia or malignancy although they certainly could have been related to granulation tissue from healing after her last laser procedure.  I will call her with biopsy results.  Given symptoms, I suspect that we will be discussing procedure to excise these areas in the operating room.  20 minutes of total time was spent for this patient encounter, including preparation, face-to-face counseling with the patient and coordination of care, and documentation of the encounter.  Eugene Garnet, MD  Division of Gynecologic Oncology  Department of Obstetrics and Gynecology  [facility]

## 2022-10-06 ENCOUNTER — Telehealth: Payer: Self-pay | Admitting: Gynecologic Oncology

## 2022-10-06 NOTE — Telephone Encounter (Signed)
Called patient's daughter - discussed recent biopsy results. Given VAIN2/3 and difficulty with application of topical therapy, I recommended procedure to include excision and/or laser. Will work to schedule this in the next few weeks.  Eugene Garnet MD Gynecologic Oncology

## 2022-10-08 ENCOUNTER — Other Ambulatory Visit: Payer: Self-pay

## 2022-10-08 ENCOUNTER — Encounter (HOSPITAL_BASED_OUTPATIENT_CLINIC_OR_DEPARTMENT_OTHER): Payer: Self-pay | Admitting: Gynecologic Oncology

## 2022-10-08 NOTE — Progress Notes (Addendum)
Spoke w/ via phone for pre-op interview---pt Lab needs dos---- I stat     surgery orders pending         Lab results------EKG 04-22-2022 epic COVID test -----patient states asymptomatic no test needed Arrive at -------645 am 10-15-2022 NPO after MN NO Solid Food.  Clear liquids from MN until---545 am Med rec completed Medications to take morning of surgery -----levothyroxine Diabetic medication -----n/a Patient instructed no nail polish to be worn day of surgery Patient instructed to bring photo id and insurance card day of surgery Patient aware to have Driver (ride ) / caregiver  daughter buffie   for 24 hours after surgery  Patient Special Instructions -----none Pre-Op special Instructions -----pt stated receiving no pre op instructions for 81 mg aspirin, last dose to be day before surgery 10-14-2022 Patient verbalized understanding of instructions that were given at this phone interview. Patient denies shortness of breath, chest pain, fever, cough at this phone interview.

## 2022-10-12 ENCOUNTER — Telehealth: Payer: Self-pay | Admitting: *Deleted

## 2022-10-12 NOTE — Telephone Encounter (Signed)
Spoke with [name] and relayed message from provider that her surgery scheduled on Thursday, August 8 th has a new time. Pt advised to arrive at [facility] at 0900. Pt verbalized understanding and thanked the office for calling.

## 2022-10-14 ENCOUNTER — Telehealth: Payer: Self-pay | Admitting: *Deleted

## 2022-10-14 NOTE — Telephone Encounter (Signed)
Telephone call to check on pre-operative status.  Patient compliant with pre-operative instructions.  Reinforced nothing to eat after midnight. Clear liquids until 0800. Patient to arrive at 0900.  No questions or concerns voiced.  Instructed to call for any needs. 

## 2022-10-15 ENCOUNTER — Ambulatory Visit (HOSPITAL_COMMUNITY): Payer: Medicare Other | Admitting: Anesthesiology

## 2022-10-15 ENCOUNTER — Telehealth: Payer: Self-pay | Admitting: *Deleted

## 2022-10-15 ENCOUNTER — Other Ambulatory Visit: Payer: Self-pay

## 2022-10-15 ENCOUNTER — Ambulatory Visit (HOSPITAL_BASED_OUTPATIENT_CLINIC_OR_DEPARTMENT_OTHER)
Admission: RE | Admit: 2022-10-15 | Discharge: 2022-10-15 | Disposition: A | Discharge status: Home / Self Care | Payer: Medicare Other | Attending: Gynecologic Oncology | Admitting: Gynecologic Oncology

## 2022-10-15 ENCOUNTER — Encounter (HOSPITAL_BASED_OUTPATIENT_CLINIC_OR_DEPARTMENT_OTHER)
Admission: RE | Disposition: A | Discharge status: Home / Self Care | Payer: Self-pay | Source: Home / Self Care | Attending: Gynecologic Oncology

## 2022-10-15 ENCOUNTER — Encounter (HOSPITAL_BASED_OUTPATIENT_CLINIC_OR_DEPARTMENT_OTHER): Payer: Self-pay | Admitting: Gynecologic Oncology

## 2022-10-15 DIAGNOSIS — N903 Dysplasia of vulva, unspecified: Secondary | ICD-10-CM | POA: Diagnosis present

## 2022-10-15 DIAGNOSIS — Z01818 Encounter for other preprocedural examination: Secondary | ICD-10-CM

## 2022-10-15 DIAGNOSIS — Z539 Procedure and treatment not carried out, unspecified reason: Secondary | ICD-10-CM | POA: Diagnosis not present

## 2022-10-15 LAB — POCT I-STAT, CHEM 8
BUN: 13 mg/dL (ref 8–23)
Calcium, Ion: 1.27 mmol/L (ref 1.15–1.40)
Chloride: 104 mmol/L (ref 98–111)
Creatinine, Ser: 0.9 mg/dL (ref 0.44–1.00)
Glucose, Bld: 118 mg/dL — ABNORMAL HIGH (ref 70–99)
HCT: 41 % (ref 36.0–46.0)
Hemoglobin: 13.9 g/dL (ref 12.0–15.0)
Potassium: 3.4 mmol/L — ABNORMAL LOW (ref 3.5–5.1)
Sodium: 140 mmol/L (ref 135–145)
TCO2: 24 mmol/L (ref 22–32)

## 2022-10-15 SURGERY — EXAM UNDER ANESTHESIA
Anesthesia: General

## 2022-10-15 MED ORDER — FENTANYL CITRATE (PF) 100 MCG/2ML IJ SOLN
INTRAMUSCULAR | Status: AC
  Filled 2022-10-15: qty 2

## 2022-10-15 MED ORDER — PROPOFOL 1000 MG/100ML IV EMUL
INTRAVENOUS | Status: AC
  Filled 2022-10-15: qty 100

## 2022-10-15 MED ORDER — LACTATED RINGERS IV SOLN: INTRAVENOUS | Status: DC

## 2022-10-15 MED ORDER — LIDOCAINE HCL (PF) 2 % IJ SOLN
INTRAMUSCULAR | Status: AC
  Filled 2022-10-15: qty 5

## 2022-10-15 MED ORDER — ACETAMINOPHEN 500 MG PO TABS
ORAL_TABLET | ORAL | Status: AC
  Filled 2022-10-15: qty 2

## 2022-10-15 MED ORDER — ACETAMINOPHEN 500 MG PO TABS
1000.0000 mg | ORAL_TABLET | ORAL | Status: AC
  Administered 2022-10-15: 1000 mg via ORAL

## 2022-10-15 NOTE — Anesthesia Preprocedure Evaluation (Addendum)
Anesthesia Evaluation  Patient identified by MRN, date of birth, ID band Patient awake    Reviewed: Allergy & Precautions, NPO status , Patient's Chart, lab work & pertinent test results  History of Anesthesia Complications (+) PONV and history of anesthetic complications  Airway Mallampati: III  TM Distance: >3 FB Neck ROM: Full    Dental no notable dental hx.    Pulmonary former smoker   Pulmonary exam normal        Cardiovascular hypertension, Pt. on medications Normal cardiovascular exam     Neuro/Psych negative neurological ROS  negative psych ROS   GI/Hepatic negative GI ROS, Neg liver ROS,,,  Endo/Other  Hypothyroidism    Renal/GU negative Renal ROS     Musculoskeletal negative musculoskeletal ROS (+)    Abdominal   Peds  Hematology negative hematology ROS (+)   Anesthesia Other Findings VULVAR DYSPLASIA  Reproductive/Obstetrics                             Anesthesia Physical Anesthesia Plan  ASA: 3  Anesthesia Plan: General   Post-op Pain Management:    Induction: Intravenous  PONV Risk Score and Plan: 4 or greater and Ondansetron, Dexamethasone, Propofol infusion, Treatment may vary due to age or medical condition and TIVA  Airway Management Planned: LMA  Additional Equipment:   Intra-op Plan:   Post-operative Plan: Extubation in OR  Informed Consent: I have reviewed the patients History and Physical, chart, labs and discussed the procedure including the risks, benefits and alternatives for the proposed anesthesia with the patient or authorized representative who has indicated his/her understanding and acceptance.     Dental advisory given  Plan Discussed with: CRNA  Anesthesia Plan Comments:        Anesthesia Quick Evaluation

## 2022-10-15 NOTE — Discharge Instructions (Addendum)
We are waiting for confirmation but we are hoping to do your procedure early in the am on Friday, Oct 23, 2022. We will call you with additional information.

## 2022-10-15 NOTE — Telephone Encounter (Signed)
Spoke with patient and relayed message from providers that her surgery is re-scheduled for Friday, August 16 th at 0730 with Dr. Pricilla Holm at [facility]. Pt advised to arrive at 0530 andthe office will call her the day before for her pre-op call. Pt verbalized understanding.

## 2022-10-15 NOTE — Progress Notes (Signed)
Dr. Pricilla Holm at bedside to speak with pt and daughter regarding power outage at hospital r/t weather. Plan to reschedule procedure for next week. Pt and family verbalized understanding and are aware to expect call to reschedule. IV discontinued and belongings returned to pt. Pt discharged home.

## 2022-10-15 NOTE — Interval H&P Note (Signed)
History and Physical Interval Note:  10/15/2022 11:43 AM  [name]  has presented today for surgery, with the diagnosis of VULVAR DYSPLASIA.  The various methods of treatment have been discussed with the patient and family. After consideration of risks, benefits and other options for treatment, the patient has consented to  Procedure(s): EXAM UNDER ANESTHESIA (N/A) WIDE EXCISION VULVECTOMY (N/A) CO2 LASER OF VULVA (N/A) as a surgical intervention.  The patient's history has been reviewed, patient examined, no change in status, stable for surgery.  I have reviewed the patient's chart and labs.  Questions were answered to the patient's satisfaction.     Carver Fila

## 2022-10-16 NOTE — Progress Notes (Signed)
Spoke w/ via phone for pre-op interview---pt Lab needs dos---- I stat            Lab results------EKG 04-22-2022 epic COVID test -----patient states asymptomatic no test needed Arrive at -------530 am 10-23-2022 NPO after MN NO Solid Food.  Clear liquids from MN until---430 am Med rec completed Medications to take morning of surgery -----levothyroxine Diabetic medication -----n/a Patient instructed no nail polish to be worn day of surgery Patient instructed to bring photo id and insurance card day of surgery Patient aware to have Driver (ride ) / caregiver  daughter buffie   for 24 hours after surgery  Patient Special Instructions -----none Pre-Op special Instructions -----pt stated receiving no pre op instructions for 81 mg aspirin, last dose to be day before surgery 10-22-2022 Patient verbalized understanding of instructions that were given at this phone interview. Patient denies shortness of breath, chest pain, fever, cough at this phone interview.

## 2022-10-22 ENCOUNTER — Telehealth: Payer: Self-pay | Admitting: *Deleted

## 2022-10-22 NOTE — Anesthesia Preprocedure Evaluation (Addendum)
Anesthesia Evaluation  Patient identified by MRN, date of birth, ID band Patient awake    Reviewed: Allergy & Precautions, NPO status , Patient's Chart, lab work & pertinent test results  History of Anesthesia Complications (+) PONV, PROLONGED EMERGENCE and history of anesthetic complications  Airway Mallampati: III  TM Distance: >3 FB Neck ROM: Full    Dental  (+) Dental Advisory Given   Pulmonary neg shortness of breath, asthma (has not needed inhaler in years) , neg sleep apnea, neg COPD, neg recent URI, former smoker   Pulmonary exam normal breath sounds clear to auscultation       Cardiovascular hypertension (HCTZ), Pt. on medications (-) angina (-) Past MI, (-) Cardiac Stents and (-) CABG (-) dysrhythmias  Rhythm:Regular Rate:Normal  HLD, celiac artery aneurysm   Neuro/Psych negative neurological ROS     GI/Hepatic negative GI ROS, Neg liver ROS,,,  Endo/Other  neg diabetesHypothyroidism    Renal/GU negative Renal ROS     Musculoskeletal   Abdominal   Peds  Hematology negative hematology ROS (+)   Anesthesia Other Findings   Reproductive/Obstetrics H/o cervical cancer s/p TAH w/BSO, recurrent vulvar cancer                             Anesthesia Physical Anesthesia Plan  ASA: 2  Anesthesia Plan: General   Post-op Pain Management: Tylenol PO (pre-op)*   Induction: Intravenous  PONV Risk Score and Plan: 4 or greater and Ondansetron, Dexamethasone, TIVA and Treatment may vary due to age or medical condition  Airway Management Planned: LMA  Additional Equipment:   Intra-op Plan:   Post-operative Plan: Extubation in OR  Informed Consent: I have reviewed the patients History and Physical, chart, labs and discussed the procedure including the risks, benefits and alternatives for the proposed anesthesia with the patient or authorized representative who has indicated his/her  understanding and acceptance.     Dental advisory given  Plan Discussed with: Anesthesiologist and CRNA  Anesthesia Plan Comments: (Risks of general anesthesia discussed including, but not limited to, sore throat, hoarse voice, chipped/damaged teeth, injury to vocal cords, nausea and vomiting, allergic reactions, lung infection, heart attack, stroke, and death. Also discussed increased risks of POCD with age. All questions answered. )       Anesthesia Quick Evaluation

## 2022-10-22 NOTE — Telephone Encounter (Signed)
Telephone call to check on pre-operative status.  Patient compliant with pre-operative instructions.  Reinforced nothing to eat after midnight. Clear liquids until 0415. Patient to arrive at [place].  No questions or concerns voiced.  Instructed to call for any needs.

## 2022-10-23 ENCOUNTER — Ambulatory Visit (HOSPITAL_BASED_OUTPATIENT_CLINIC_OR_DEPARTMENT_OTHER)
Admission: RE | Admit: 2022-10-23 | Discharge: 2022-10-23 | Disposition: A | Discharge status: Home / Self Care | Payer: Medicare Other | Attending: Gynecologic Oncology | Admitting: Gynecologic Oncology

## 2022-10-23 ENCOUNTER — Encounter (HOSPITAL_BASED_OUTPATIENT_CLINIC_OR_DEPARTMENT_OTHER): Payer: Self-pay | Admitting: Gynecologic Oncology

## 2022-10-23 ENCOUNTER — Ambulatory Visit (HOSPITAL_BASED_OUTPATIENT_CLINIC_OR_DEPARTMENT_OTHER): Payer: Medicare Other | Admitting: Anesthesiology

## 2022-10-23 ENCOUNTER — Encounter (HOSPITAL_BASED_OUTPATIENT_CLINIC_OR_DEPARTMENT_OTHER)
Admission: RE | Disposition: A | Discharge status: Home / Self Care | Payer: Self-pay | Source: Home / Self Care | Attending: Gynecologic Oncology

## 2022-10-23 ENCOUNTER — Other Ambulatory Visit: Payer: Self-pay

## 2022-10-23 DIAGNOSIS — E119 Type 2 diabetes mellitus without complications: Secondary | ICD-10-CM | POA: Insufficient documentation

## 2022-10-23 DIAGNOSIS — J45909 Unspecified asthma, uncomplicated: Secondary | ICD-10-CM | POA: Diagnosis not present

## 2022-10-23 DIAGNOSIS — L859 Epidermal thickening, unspecified: Secondary | ICD-10-CM | POA: Insufficient documentation

## 2022-10-23 DIAGNOSIS — D071 Carcinoma in situ of vulva: Secondary | ICD-10-CM | POA: Diagnosis not present

## 2022-10-23 DIAGNOSIS — Z8541 Personal history of malignant neoplasm of cervix uteri: Secondary | ICD-10-CM | POA: Insufficient documentation

## 2022-10-23 DIAGNOSIS — N9089 Other specified noninflammatory disorders of vulva and perineum: Secondary | ICD-10-CM

## 2022-10-23 DIAGNOSIS — Z87891 Personal history of nicotine dependence: Secondary | ICD-10-CM | POA: Diagnosis not present

## 2022-10-23 DIAGNOSIS — Z8544 Personal history of malignant neoplasm of other female genital organs: Secondary | ICD-10-CM | POA: Insufficient documentation

## 2022-10-23 DIAGNOSIS — N903 Dysplasia of vulva, unspecified: Secondary | ICD-10-CM | POA: Diagnosis not present

## 2022-10-23 DIAGNOSIS — I10 Essential (primary) hypertension: Secondary | ICD-10-CM | POA: Insufficient documentation

## 2022-10-23 HISTORY — PX: CO2 LASER APPLICATION: SHX5778

## 2022-10-23 HISTORY — PX: VULVECTOMY: SHX1086

## 2022-10-23 LAB — POCT I-STAT, CHEM 8
BUN: 13 mg/dL (ref 8–23)
Calcium, Ion: 1.31 mmol/L (ref 1.15–1.40)
Chloride: 105 mmol/L (ref 98–111)
Creatinine, Ser: 0.9 mg/dL (ref 0.44–1.00)
Glucose, Bld: 120 mg/dL — ABNORMAL HIGH (ref 70–99)
HCT: 42 % (ref 36.0–46.0)
Hemoglobin: 14.3 g/dL (ref 12.0–15.0)
Potassium: 3.9 mmol/L (ref 3.5–5.1)
Sodium: 141 mmol/L (ref 135–145)
TCO2: 25 mmol/L (ref 22–32)

## 2022-10-23 SURGERY — EXAM UNDER ANESTHESIA
Anesthesia: General

## 2022-10-23 MED ORDER — TRAMADOL HCL 50 MG PO TABS: 50.0000 mg | ORAL_TABLET | Freq: Four times a day (QID) | ORAL | 0 refills | Status: DC | PRN

## 2022-10-23 MED ORDER — OXYCODONE HCL 5 MG/5ML PO SOLN: 5.0000 mg | Freq: Once | ORAL | Status: DC | PRN

## 2022-10-23 MED ORDER — ACETIC ACID 5 % SOLN
Status: DC | PRN
  Administered 2022-10-23: 1 via TOPICAL

## 2022-10-23 MED ORDER — FENTANYL CITRATE (PF) 100 MCG/2ML IJ SOLN
INTRAMUSCULAR | Status: DC | PRN
  Administered 2022-10-23: 25 ug via INTRAVENOUS

## 2022-10-23 MED ORDER — ACETAMINOPHEN 500 MG PO TABS
ORAL_TABLET | ORAL | Status: AC
  Filled 2022-10-23: qty 2

## 2022-10-23 MED ORDER — LIDOCAINE 2% (20 MG/ML) 5 ML SYRINGE
INTRAMUSCULAR | Status: DC | PRN
  Administered 2022-10-23: 60 mg via INTRAVENOUS

## 2022-10-23 MED ORDER — DEXAMETHASONE SODIUM PHOSPHATE 10 MG/ML IJ SOLN
INTRAMUSCULAR | Status: DC | PRN
  Administered 2022-10-23: 5 mg via INTRAVENOUS

## 2022-10-23 MED ORDER — LIDOCAINE 5 % EX OINT: 1.0000 | TOPICAL_OINTMENT | Freq: Three times a day (TID) | CUTANEOUS | 1 refills | Status: DC | PRN

## 2022-10-23 MED ORDER — FENTANYL CITRATE (PF) 100 MCG/2ML IJ SOLN
INTRAMUSCULAR | Status: AC
  Filled 2022-10-23: qty 2

## 2022-10-23 MED ORDER — PROPOFOL 10 MG/ML IV BOLUS
INTRAVENOUS | Status: DC | PRN
  Administered 2022-10-23: 20 ug via INTRAVENOUS
  Administered 2022-10-23: 90 mg via INTRAVENOUS
  Administered 2022-10-23: 200 ug/kg/min via INTRAVENOUS

## 2022-10-23 MED ORDER — OXYCODONE HCL 5 MG PO TABS: 5.0000 mg | ORAL_TABLET | Freq: Once | ORAL | Status: DC | PRN

## 2022-10-23 MED ORDER — ONDANSETRON HCL 4 MG/2ML IJ SOLN
INTRAMUSCULAR | Status: DC | PRN
Start: 2022-10-23 — End: 2022-10-23
  Administered 2022-10-23: 4 mg via INTRAVENOUS

## 2022-10-23 MED ORDER — LIDOCAINE HCL (PF) 2 % IJ SOLN
INTRAMUSCULAR | Status: AC
  Filled 2022-10-23: qty 5

## 2022-10-23 MED ORDER — FENTANYL CITRATE (PF) 100 MCG/2ML IJ SOLN: 25.0000 ug | INTRAMUSCULAR | Status: DC | PRN

## 2022-10-23 MED ORDER — BUPIVACAINE LIPOSOME 1.3 % IJ SUSP
INTRAMUSCULAR | Status: DC | PRN
  Administered 2022-10-23: 20 mL

## 2022-10-23 MED ORDER — SILVER SULFADIAZINE 1 % EX CREA
TOPICAL_CREAM | CUTANEOUS | Status: DC | PRN
  Administered 2022-10-23: 1 via TOPICAL

## 2022-10-23 MED ORDER — AMISULPRIDE (ANTIEMETIC) 5 MG/2ML IV SOLN: 10.0000 mg | Freq: Once | INTRAVENOUS | Status: DC | PRN

## 2022-10-23 MED ORDER — LACTATED RINGERS IV SOLN: INTRAVENOUS | Status: DC

## 2022-10-23 MED ORDER — ACETAMINOPHEN 500 MG PO TABS
1000.0000 mg | ORAL_TABLET | Freq: Once | ORAL | Status: AC
  Administered 2022-10-23: 1000 mg via ORAL

## 2022-10-23 MED ORDER — PROPOFOL 1000 MG/100ML IV EMUL
INTRAVENOUS | Status: AC
  Filled 2022-10-23: qty 100

## 2022-10-23 SURGICAL SUPPLY — 39 items
BLADE CLIPPER SENSICLIP SURGIC (BLADE) IMPLANT
BLADE SURG 11 STRL SS (BLADE) IMPLANT
BLADE SURG 15 STRL LF DISP TIS (BLADE) ×1 IMPLANT
BLADE SURG 15 STRL SS (BLADE) ×1
CATH ROBINSON RED A/P 16FR (CATHETERS) ×1 IMPLANT
DEPRESSOR TONGUE 6 IN STERILE (GAUZE/BANDAGES/DRESSINGS) ×1 IMPLANT
DRSG TELFA 3X8 NADH STRL (GAUZE/BANDAGES/DRESSINGS) IMPLANT
ELECT BALL LEEP 3MM BLK (ELECTRODE) IMPLANT
GAUZE 4X4 16PLY ~~LOC~~+RFID DBL (SPONGE) ×1 IMPLANT
GLOVE BIO SURGEON STRL SZ 6 (GLOVE) ×2 IMPLANT
GOWN STRL REUS W/TWL LRG LVL3 (GOWN DISPOSABLE) ×1 IMPLANT
KIT TURNOVER CYSTO (KITS) ×1 IMPLANT
NDL HYPO 25X1 1.5 SAFETY (NEEDLE) ×1 IMPLANT
NEEDLE HYPO 25X1 1.5 SAFETY (NEEDLE) ×1
NS IRRIG 500ML POUR BTL (IV SOLUTION) ×1 IMPLANT
PACK PERINEAL COLD (PAD) ×1 IMPLANT
PACK VAGINAL WOMENS (CUSTOM PROCEDURE TRAY) ×1 IMPLANT
PAD PREP 24X48 CUFFED NSTRL (MISCELLANEOUS) ×1 IMPLANT
PUNCH BIOPSY DERMAL 3 (INSTRUMENTS) IMPLANT
PUNCH BIOPSY DERMAL 3MM (INSTRUMENTS)
PUNCH BIOPSY DERMAL 4MM (INSTRUMENTS) IMPLANT
SLEEVE SCD COMPRESS KNEE MED (STOCKING) ×1 IMPLANT
SUT VIC AB 0 CT1 36 (SUTURE) IMPLANT
SUT VIC AB 0 SH 27 (SUTURE) ×1 IMPLANT
SUT VIC AB 2-0 CT2 27 (SUTURE) IMPLANT
SUT VIC AB 2-0 SH 27 (SUTURE)
SUT VIC AB 2-0 SH 27XBRD (SUTURE) IMPLANT
SUT VIC AB 3-0 PS2 18 (SUTURE)
SUT VIC AB 3-0 PS2 18XBRD (SUTURE) IMPLANT
SUT VIC AB 3-0 SH 27 (SUTURE) ×2
SUT VIC AB 3-0 SH 27X BRD (SUTURE) ×1 IMPLANT
SUT VIC AB 4-0 PS2 18 (SUTURE) ×1 IMPLANT
SUT VICRYL 0 UR6 27IN ABS (SUTURE) IMPLANT
SWAB OB GYN 8IN STERILE 2PK (MISCELLANEOUS) ×2 IMPLANT
SYR BULB IRRIG 60ML STRL (SYRINGE) ×1 IMPLANT
TOWEL OR 17X24 6PK STRL BLUE (TOWEL DISPOSABLE) ×2 IMPLANT
TUBE CONNECTING 12X1/4 (SUCTIONS) IMPLANT
VACUUM HOSE 7/8X10 W/ WAND (MISCELLANEOUS) IMPLANT
WATER STERILE IRR 500ML POUR (IV SOLUTION) ×1 IMPLANT

## 2022-10-23 NOTE — Discharge Instructions (Addendum)
AFTER SURGERY INSTRUCTIONS   Return to work:  2-4 weeks if applicable  We recommend purchasing several bags of frozen green peas and dividing them into ziploc bags. You will want to keep these in the freezer and have them ready to use as ice packs to the vulvar incision. Once the ice pack is no longer cold, you can get another from the freezer. The frozen peas mold to your body better than a regular ice pack.   Topical lidocaine has been sent to your pharmacy along with tramadol for pain if needed. Plan on also applying the silvadene cream to the vulva at least twice daily to assist with healing.   Activity: 1. Be up and out of the bed during the day.  Take a nap if needed.  You may walk up steps but be careful and use the hand rail.  Stair climbing will tire you more than you think, you may need to stop part way and rest.    2. No lifting or straining for 2 weeks minimum, ideally 4 weeks over 10 pounds. No pushing, pulling, straining for 2 weeks.   3. No driving for minimum 24 hours after surgery.  Do not drive if you are taking narcotic pain medicine and make sure that your reaction time has returned.    4. You can shower as soon as the next day after surgery. Shower daily. No tub baths or submerging your body in water until cleared by your surgeon.    5. No sexual activity and nothing in the vagina for 4-6 weeks.   8. You may experience vulvar spotting and discharge after surgery.  The spotting is normal but if you experience heavy bleeding, call our office.   9. Take Tylenol or ibuprofen for pain IF YOU ARE ABLE TO TAKE THESE MEDICATIONS. Monitor your Tylenol intake to a max of 4,000 mg in a 24 hour period.    Diet: 1. Low sodium Heart Healthy Diet is recommended but you are cleared to resume your normal (before surgery) diet after your procedure.   2. It is safe to use a laxative, such as Miralax or Colace, if you have difficulty moving your bowels. You can continue using the milk of  mag you have at home.   Wound Care: 1. Keep clean and dry.  Shower daily.   Reasons to call the Doctor: Fever - Oral temperature greater than 100.4 degrees Fahrenheit Foul-smelling vaginal discharge Difficulty urinating Nausea and vomiting Increased pain at the site of the incision that is unrelieved with pain medicine. Difficulty breathing with or without chest pain New calf pain especially if only on one side Sudden, continuing increased vaginal bleeding with or without clots.   Contacts: For questions or concerns you should contact:   Dr. Eugene Garnet at [phone]   [name], NP at [phone]   After Hours: call [phone] and have the GYN Oncologist paged/contacted (after 5 pm or on the weekends).   Messages sent via mychart are for non-urgent matters and are not responded to after hours so for urgent needs, please call the after hours number.Information for Discharge Teaching: EXPAREL (bupivacaine liposome injectable suspension)   Pain relief is important to your recovery. The goal is to control your pain so you can move easier and return to your normal activities as soon as possible after your procedure. Your physician may use several types of medicines to manage pain, swelling, and more.  Your surgeon or anesthesiologist gave you EXPAREL(bupivacaine) to help control  your pain after surgery.  EXPAREL is a local anesthetic designed to release slowly over an extended period of time to provide pain relief by numbing the tissue around the surgical site. EXPAREL is designed to release pain medication over time and can control pain for up to 72 hours. Depending on how you respond to EXPAREL, you may require less pain medication during your recovery. EXPAREL can help reduce or eliminate the need for opioids during the first few days after surgery when pain relief is needed the most. EXPAREL is not an opioid and is not addictive. It does not cause sleepiness or  sedation.   Important! A teal colored band has been placed on your arm with the date, time and amount of EXPAREL you have received. Please leave this armband in place for the full 96 hours following administration, and then you may remove the band. If you return to the hospital for any reason within 96 hours following the administration of EXPAREL, the armband provides important information that your health care providers to know, and alerts them that you have received this anesthetic.    Possible side effects of EXPAREL: Temporary loss of sensation or ability to move in the area where medication was injected. Nausea, vomiting, constipation Rarely, numbness and tingling in your mouth or lips, lightheadedness, or anxiety may occur. Call your doctor right away if you think you may be experiencing any of these sensations, or if you have other questions regarding possible side effects.  Follow all other discharge instructions given to you by your surgeon or nurse. Eat a healthy diet and drink plenty of water or other fluids. Post Anesthesia Home Care Instructions  Activity: Get plenty of rest for the remainder of the day. A responsible individual must stay with you for 24 hours following the procedure.  For the next 24 hours, DO NOT: -Drive a car -Advertising copywriter -Drink alcoholic beverages -Take any medication unless instructed by your physician -Make any legal decisions or sign important papers.  Meals: Start with liquid foods such as gelatin or soup. Progress to regular foods as tolerated. Avoid greasy, spicy, heavy foods. If nausea and/or vomiting occur, drink only clear liquids until the nausea and/or vomiting subsides. Call your physician if vomiting continues.  Special Instructions/Symptoms: Your throat may feel dry or sore from the anesthesia or the breathing tube placed in your throat during surgery. If this causes discomfort, gargle with warm salt water. The discomfort should  disappear within 24 hours.  If you had a scopolamine patch placed behind your ear for the management of post- operative nausea and/or vomiting:  1. The medication in the patch is effective for 72 hours, after which it should be removed.  Wrap patch in a tissue and discard in the trash. Wash hands thoroughly with soap and water. 2. You may remove the patch earlier than 72 hours if you experience unpleasant side effects which may include dry mouth, dizziness or visual disturbances. 3. Avoid touching the patch. Wash your hands with soap and water after contact with the patch.

## 2022-10-23 NOTE — Interval H&P Note (Signed)
History and Physical Interval Note:  10/23/2022 6:03 AM  [name]  has presented today for surgery, with the diagnosis of VULVAR DYSPLASIA.  The various methods of treatment have been discussed with the patient and family. After consideration of risks, benefits and other options for treatment, the patient has consented to  Procedure(s): EXAM UNDER ANESTHESIA (N/A) WIDE EXCISION VULVECTOMY (N/A) CO2 LASER OF VULVA (N/A) as a surgical intervention.  The patient's history has been reviewed, patient examined, no change in status, stable for surgery.  I have reviewed the patient's chart and labs.  Questions were answered to the patient's satisfaction.     Carver Fila

## 2022-10-23 NOTE — Anesthesia Postprocedure Evaluation (Signed)
Anesthesia Post Note  Patient: [name]  Procedure(s) Performed: EXAM UNDER ANESTHESIA WIDE EXCISION VULVECTOMY CO2 LASER OF VULVA     Patient location during evaluation: PACU Anesthesia Type: General Level of consciousness: awake Pain management: pain level controlled Vital Signs Assessment: post-procedure vital signs reviewed and stable Respiratory status: spontaneous breathing, nonlabored ventilation and respiratory function stable Cardiovascular status: blood pressure returned to baseline and stable Postop Assessment: no apparent nausea or vomiting Anesthetic complications: no   No notable events documented.  Last Vitals:  Vitals:   10/23/22 0845 10/23/22 0900  BP: 110/61 122/70  Pulse: 62 64  Resp: 13 15  Temp:    SpO2: 94% 94%    Last Pain:  Vitals:   10/23/22 0900  TempSrc:   PainSc: 0-No pain                 Linton Rump

## 2022-10-23 NOTE — Transfer of Care (Signed)
Immediate Anesthesia Transfer of Care Note  Patient: [name]  Procedure(s) Performed: Procedure(s) (LRB): EXAM UNDER ANESTHESIA (N/A) WIDE EXCISION VULVECTOMY (N/A) CO2 LASER OF VULVA (N/A)  Patient Location: PACU  Anesthesia Type: General  Level of Consciousness: awake, oriented, sedated and patient cooperative  Airway & Oxygen Therapy: Patient Spontanous Breathing room air  Post-op Assessment: Report given to PACU RN and Post -op Vital signs reviewed and stable  Post vital signs: Reviewed and stable  Complications: No apparent anesthesia complications Last Vitals:  Vitals Value Taken Time  BP 109/62 10/23/22 0830  Temp 36.4 C 10/23/22 0828  Pulse 69 10/23/22 0832  Resp 15 10/23/22 0832  SpO2 94 % 10/23/22 0832  Vitals shown include unfiled device data.  Last Pain:  Vitals:   10/23/22 0548  TempSrc: Oral  PainSc: 0-No pain      Patients Stated Pain Goal: 3 (10/23/22 0548)  Complications: No notable events documented.

## 2022-10-23 NOTE — Op Note (Signed)
Operative Report  PATIENT: [name] DATE: 10/23/22  Preop Diagnosis: History of early stage vulvar cancer, now with recurrent vulvar dysplasia, agglutination of vulva  Postoperative Diagnosis: Same as above  Surgery: Exam under anesthesia, lysis of vulvar adhesions, multiple vulvar and perianal biopsies, partial simple right vulvectomy x3, vulvar laser ablation  Surgeons:  Eugene Garnet, MD  Assistant: none  Anesthesia: LMA  Estimated blood loss: 20 ml  IVF:  see I&O flowsheet   Urine output: n/a   Complications: None apparent  Pathology: Vulvar biopsies at 8, 11, and 2:00, perianal biopsy; left vulvar excision at 2:00, left anterior vulvar excision at 12:00 and left vulvar excision at 1:00 all with marking stitch at 12 o'clock  Operative findings: On EUA, some agglutination of the vulva anteriorly covering the urethra, lysed without difficulty.  Very atrophic vulvar tissue.  Somewhat hyperpigmented ridge of tissue extending from approximately 7:00 to 11:00 along the right vulva.  Biopsy taken in 2 locations and area treated with laser ablation.  3 visible lesions on the anterior and left vulva, largest measuring just over a centimeter on the left vulva, previously biopsied.  3 smaller satellite lesions noted anteriorly, 2 adjacent to each other at 12:00 and right lesion at 1:00.  All of these lesions were excised.  Some additional mildly raised and hyperpigmented tissue medial to the left 2:00 lesion spanning to the midline.  Biopsy taken here and treated with laser ablation.  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  Procedure: The patient was identified in the preoperative holding area. Informed consent was signed on the chart. Patient was seen history was reviewed and exam was performed.   The patient was then taken to the operating room and placed in the supine position with SCD hose on.  General anesthesia was then induced without difficulty. She was then placed in the dorsolithotomy position. The perineum was prepped with Betadine. The vagina was prepped with Betadine. The patient was then draped after the prep was dried.   Timeout was performed the patient, procedure, antibiotic, allergy, and length of procedure. Gentle lysis of adhesions of the anterior vulva was performed with excellent result.  The vulva was then examined with findings noted above.  Multiple vulvar biopsies were taken with Tischler forceps and monopolar electrocautery was used to achieve hemostasis.  Exparel was then injected for local anesthesia.  A marking pen was used to circumscribe the 3 areas to be removed with appropriate surgical margins.  The subcutaneous tissue had already been injected with Exparel.  A 15 blade scalpel was used to make an incision through the skin circumferentially at each location.  The skin ellipse was grasped and separated from underlying deep dermal tissue with Bovie monopolar electrocautery.  After the specimens have been completely removed, each was oriented with a 0 Vicryl suture at 12:00.  Bovie was then used to achieve hemostasis at each surgical bed. The subcutaneous tissues were irrigated and made hemostatic.   At each incision, the deep dermal layer was approximated with 3-0 vicryl mattress sutures to bring the skin edges into approximation and off tension. The wounds were closed following langher's lines. The cutaneous layer was closed with interrupted 4-0 vicryl stitches and mattress sutures to ensure a tension free and hemostatic closure. The perineum was again irrigated.   Additional areas to be lasered we identified. The patient's surgical field was draped with wet towels. The staff and patient ensured laser-safe  eyewear and masks were fitted. The laser was set to 8 watts continuous. The laser was tested for accuracy on a tongue depressor.  The laser was applied to the  circumscribed areas of the vulva that had been previously identified. The tissue was ablated to the desired depth and the eschar was removed with a moistened sponge. When the entire lesion had been ablated the procedure was complete.  Silvadine cream was applied to the laser site.  Additional peri-anal biopsy was taken with Tischler forceps.  All instrument, suture, laparotomy, Ray-Tec, and needle counts were correct x2. The patient tolerated the procedure well and was taken recovery room in stable condition.   Eugene Garnet MD Gynecologic Oncology

## 2022-10-23 NOTE — Brief Op Note (Signed)
10/23/2022  8:38 AM  PATIENT:  [name]  82 y.o. female  PRE-OPERATIVE DIAGNOSIS:  VULVAR DYSPLASIA  POST-OPERATIVE DIAGNOSIS:  VULVAR DYSPLASIA  PROCEDURE:  Procedure(s): EXAM UNDER ANESTHESIA (N/A) WIDE EXCISION VULVECTOMY (N/A) CO2 LASER OF VULVA (N/A)  SURGEON:  Surgeons and Role:    Carver Fila, MD - Primary  ANESTHESIA:   general  EBL:  20 mL   BLOOD ADMINISTERED:none  DRAINS: none   LOCAL MEDICATIONS USED:  exparel  SPECIMEN:  see orders  DISPOSITION OF SPECIMEN:  PATHOLOGY  COUNTS:  YES  TOURNIQUET:  * No tourniquets in log *  DICTATION: .Note written in EPIC  PLAN OF CARE: Discharge to home after PACU  PATIENT DISPOSITION:  PACU - hemodynamically stable.   Delay start of Pharmacological VTE agent (>24hrs) due to surgical blood loss or risk of bleeding: not applicable

## 2022-10-23 NOTE — Anesthesia Procedure Notes (Signed)
Procedure Name: LMA Insertion Date/Time: 10/23/2022 7:34 AM  Performed by: Francie Massing, CRNAPre-anesthesia Checklist: Patient identified, Emergency Drugs available, Suction available and Patient being monitored Patient Re-evaluated:Patient Re-evaluated prior to induction Oxygen Delivery Method: Circle system utilized Preoxygenation: Pre-oxygenation with 100% oxygen Induction Type: IV induction Ventilation: Mask ventilation without difficulty LMA: LMA inserted LMA Size: 4.0 Number of attempts: 1 Airway Equipment and Method: Bite block Placement Confirmation: positive ETCO2 Tube secured with: Tape Dental Injury: Teeth and Oropharynx as per pre-operative assessment

## 2022-10-26 ENCOUNTER — Telehealth: Payer: Self-pay | Admitting: *Deleted

## 2022-10-26 ENCOUNTER — Encounter (HOSPITAL_BASED_OUTPATIENT_CLINIC_OR_DEPARTMENT_OTHER): Payer: Self-pay | Admitting: Gynecologic Oncology

## 2022-10-26 LAB — SURGICAL PATHOLOGY

## 2022-10-26 NOTE — Telephone Encounter (Signed)
Spoke with [name] this morning. She states she is eating, drinking and urinating well. She has had a BM and is passing gas. Encouraged her to drink plenty of water. She denies fever or chills. She rates her pain 0/10.  Instructed to call office with any fever, chills, purulent drainage, uncontrolled pain or any other questions or concerns. Patient verbalizes understanding.   Pt aware of post op appointments as well as the office number [phone] and after hours number [phone] to call if she has any questions or concerns

## 2022-10-27 ENCOUNTER — Telehealth: Payer: Self-pay | Admitting: Gynecologic Oncology

## 2022-10-27 DIAGNOSIS — C519 Malignant neoplasm of vulva, unspecified: Secondary | ICD-10-CM

## 2022-10-27 NOTE — Telephone Encounter (Signed)
Called patient and [name] - discussed pathology which shows multi-focal cancer recurrence. Plan for PET. Will work to get her scheduled for vulvar re-excision with bilateral SLN biopsy at [facility].  Eugene Garnet MD Gynecologic Oncology

## 2022-10-28 ENCOUNTER — Telehealth: Payer: Self-pay | Admitting: Oncology

## 2022-10-28 NOTE — Telephone Encounter (Signed)
Called [name] and advised her of the PET scan appointment for 11/13/22 at [facility] with arrival at 8:30.  Also gave her instructions for NPO 6 hours prior except for water.  Advised her of appointment with Dr. Pricilla Holm on 11/20/2022 to discuss surgery and review the PET results at 8:00.  Discussed that we are planning for surgery in mid to late September and will let her know the date.  She verbalized understanding and agreement of the plan and all appointment/instructions.

## 2022-10-30 ENCOUNTER — Telehealth: Payer: Self-pay

## 2022-10-30 NOTE — Telephone Encounter (Signed)
I spoke to [name] regarding new surgery date option. Per [name] APP, 11/26/22 is an option for surgery.  [name] agrees to that date and is aware of all other appointments PET scan is 9/6.

## 2022-11-13 ENCOUNTER — Ambulatory Visit (HOSPITAL_COMMUNITY)
Admission: RE | Admit: 2022-11-13 | Discharge: 2022-11-13 | Disposition: A | Discharge status: Home / Self Care | Payer: Medicare Other | Source: Ambulatory Visit | Attending: Gynecologic Oncology

## 2022-11-13 DIAGNOSIS — C519 Malignant neoplasm of vulva, unspecified: Secondary | ICD-10-CM | POA: Insufficient documentation

## 2022-11-13 DIAGNOSIS — R918 Other nonspecific abnormal finding of lung field: Secondary | ICD-10-CM | POA: Diagnosis not present

## 2022-11-13 LAB — GLUCOSE, CAPILLARY: Glucose-Capillary: 116 mg/dL — ABNORMAL HIGH (ref 70–99)

## 2022-11-13 MED ORDER — FLUDEOXYGLUCOSE F - 18 (FDG) INJECTION
8.1700 | Freq: Once | INTRAVENOUS | Status: AC
  Administered 2022-11-13: 8.17 via INTRAVENOUS

## 2022-11-19 NOTE — Patient Instructions (Addendum)
SURGICAL WAITING ROOM VISITATION  Patients having surgery or a procedure may have no more than 2 support people in the waiting area - these visitors may rotate.    Children under the age of 53 must have an adult with them who is not the patient.  Due to an increase in RSV and influenza rates and associated hospitalizations, children ages 28 and under may not visit patients in [facility] hospitals.  If the patient needs to stay at the hospital during part of their recovery, the visitor guidelines for inpatient rooms apply. Pre-op nurse will coordinate an appropriate time for 1 support person to accompany patient in pre-op.  This support person may not rotate.    Please refer to the [facility] website for the visitor guidelines for Inpatients (after your surgery is over and you are in a regular room).    Your procedure is scheduled on: 11/26/22   Report to [facility] Main Entrance    Report to admitting at 9:45 AM   Call this number if you have problems the morning of surgery [phone]   Do not eat food :After Midnight.   After Midnight you may have the following liquids until 9:00 AM DAY OF SURGERY  Water Non-Citrus Juices (without pulp, NO RED-Apple, White grape, White cranberry) Black Coffee (NO MILK/CREAM OR CREAMERS, sugar ok)  Clear Tea (NO MILK/CREAM OR CREAMERS, sugar ok) regular and decaf                             Plain Jell-O (NO RED)                                           Fruit ices (not with fruit pulp, NO RED)                                     Popsicles (NO RED)                                                               Sports drinks like Gatorade (NO RED)          If you have questions, please contact your surgeon's office.   FOLLOW BOWEL PREP AND ANY ADDITIONAL PRE OP INSTRUCTIONS YOU RECEIVED FROM YOUR SURGEON'S OFFICE!!!     Oral Hygiene is also important to reduce your risk of infection.                                    Remember -  BRUSH YOUR TEETH THE MORNING OF SURGERY WITH YOUR REGULAR TOOTHPASTE  DENTURES WILL BE REMOVED PRIOR TO SURGERY PLEASE DO NOT APPLY "Poly grip" OR ADHESIVES!!!   Stop all vitamins and herbal supplements 7 days before surgery.   Take these medicines the morning of surgery with A SIP OF WATER: Levothyroxine, Rosuvastatin, eye gel  You may not have any metal on your body including hair pins, jewelry, and body piercing             Do not wear make-up, lotions, powders, perfumes, or deodorant  Do not wear nail polish including gel and S&S, artificial/acrylic nails, or any other type of covering on natural nails including finger and toenails. If you have artificial nails, gel coating, etc. that needs to be removed by a nail salon please have this removed prior to surgery or surgery may need to be canceled/ delayed if the surgeon/ anesthesia feels like they are unable to be safely monitored.   Do not shave  48 hours prior to surgery.    Do not bring valuables to the hospital. [facility] IS NOT             RESPONSIBLE   FOR VALUABLES.   Contacts, glasses, dentures or bridgework may not be worn into surgery.   Bring small overnight bag day of surgery.   DO NOT BRING YOUR HOME MEDICATIONS TO THE HOSPITAL. PHARMACY WILL DISPENSE MEDICATIONS LISTED ON YOUR MEDICATION LIST TO YOU DURING YOUR ADMISSION IN THE HOSPITAL!   Special Instructions: Bring a copy of your healthcare power of attorney and living will documents the day of surgery if you haven't scanned them before.              Please read over the following fact sheets you were given: IF YOU HAVE QUESTIONS ABOUT YOUR PRE-OP INSTRUCTIONS PLEASE CALL [name]    If you received a COVID test during your pre-op visit  it is requested that you wear a mask when out in public, stay away from anyone that may not be feeling well and notify your surgeon if you develop symptoms. If you test positive for Covid or  have been in contact with anyone that has tested positive in the last 10 days please notify you surgeon.    [facility] - Preparing for Surgery Before surgery, you can play an important role.  Because skin is not sterile, your skin needs to be as free of germs as possible.  You can reduce the number of germs on your skin by washing with CHG (chlorahexidine gluconate) soap before surgery.  CHG is an antiseptic cleaner which kills germs and bonds with the skin to continue killing germs even after washing. Please DO NOT use if you have an allergy to CHG or antibacterial soaps.  If your skin becomes reddened/irritated stop using the CHG and inform your nurse when you arrive at Short Stay. Do not shave (including legs and underarms) for at least 48 hours prior to the first CHG shower.  You may shave your face/neck.  Please follow these instructions carefully:  1.  Shower with CHG Soap the night before surgery and the  morning of surgery.  2.  If you choose to wash your hair, wash your hair first as usual with your normal  shampoo.  3.  After you shampoo, rinse your hair and body thoroughly to remove the shampoo.                             4.  Use CHG as you would any other liquid soap.  You can apply chg directly to the skin and wash.  Gently with a scrungie or clean washcloth.  5.  Apply the CHG Soap to your body ONLY FROM THE NECK DOWN.  Do   not use on face/ open                           Wound or open sores. Avoid contact with eyes, ears mouth and   genitals (private parts).                       Wash face,  Genitals (private parts) with your normal soap.             6.  Wash thoroughly, paying special attention to the area where your    surgery  will be performed.  7.  Thoroughly rinse your body with warm water from the neck down.  8.  DO NOT shower/wash with your normal soap after using and rinsing off the CHG Soap.                9.  Pat yourself dry with a clean towel.            10.  Wear  clean pajamas.            11.  Place clean sheets on your bed the night of your first shower and do not  sleep with pets. Day of Surgery : Do not apply any lotions/deodorants the morning of surgery.  Please wear clean clothes to the hospital/surgery center.  FAILURE TO FOLLOW THESE INSTRUCTIONS MAY RESULT IN THE CANCELLATION OF YOUR SURGERY  PATIENT SIGNATURE_________________________________  NURSE SIGNATURE__________________________________  ________________________________________________________________________ WHAT IS A BLOOD TRANSFUSION? Blood Transfusion Information  A transfusion is the replacement of blood or some of its parts. Blood is made up of multiple cells which provide different functions. Red blood cells carry oxygen and are used for blood loss replacement. White blood cells fight against infection. Platelets control bleeding. Plasma helps clot blood. Other blood products are available for specialized needs, such as hemophilia or other clotting disorders. BEFORE THE TRANSFUSION  Who gives blood for transfusions?  Healthy volunteers who are fully evaluated to make sure their blood is safe. This is blood bank blood. Transfusion therapy is the safest it has ever been in the practice of medicine. Before blood is taken from a donor, a complete history is taken to make sure that person has no history of diseases nor engages in risky social behavior (examples are intravenous drug use or sexual activity with multiple partners). The donor's travel history is screened to minimize risk of transmitting infections, such as malaria. The donated blood is tested for signs of infectious diseases, such as HIV and hepatitis. The blood is then tested to be sure it is compatible with you in order to minimize the chance of a transfusion reaction. If you or a relative donates blood, this is often done in anticipation of surgery and is not appropriate for emergency situations. It takes many days to  process the donated blood. RISKS AND COMPLICATIONS Although transfusion therapy is very safe and saves many lives, the main dangers of transfusion include:  Getting an infectious disease. Developing a transfusion reaction. This is an allergic reaction to something in the blood you were given. Every precaution is taken to prevent this. The decision to have a blood transfusion has been considered carefully by your caregiver before blood is given. Blood is not given unless the benefits outweigh the risks. AFTER THE TRANSFUSION Right after receiving a blood transfusion, you will usually feel much better and more energetic. This is especially true if  your red blood cells have gotten low (anemic). The transfusion raises the level of the red blood cells which carry oxygen, and this usually causes an energy increase. The nurse administering the transfusion will monitor you carefully for complications. HOME CARE INSTRUCTIONS  No special instructions are needed after a transfusion. You may find your energy is better. Speak with your caregiver about any limitations on activity for underlying diseases you may have. SEEK MEDICAL CARE IF:  Your condition is not improving after your transfusion. You develop redness or irritation at the intravenous (IV) site. SEEK IMMEDIATE MEDICAL CARE IF:  Any of the following symptoms occur over the next 12 hours: Shaking chills. You have a temperature by mouth above 102 F (38.9 C), not controlled by medicine. Chest, back, or muscle pain. People around you feel you are not acting correctly or are confused. Shortness of breath or difficulty breathing. Dizziness and fainting. You get a rash or develop hives. You have a decrease in urine output. Your urine turns a dark color or changes to pink, red, or brown. Any of the following symptoms occur over the next 10 days: You have a temperature by mouth above 102 F (38.9 C), not controlled by medicine. Shortness of  breath. Weakness after normal activity. The white part of the eye turns yellow (jaundice). You have a decrease in the amount of urine or are urinating less often. Your urine turns a dark color or changes to pink, red, or brown. Document Released: 02/21/2000 Document Revised: 05/18/2011 Document Reviewed: 10/10/2007 [facility] Patient Information 2014 [place], [place].  _______________________________________________________________________

## 2022-11-19 NOTE — Progress Notes (Addendum)
COVID Vaccine Completed: no  Date of COVID positive in last 13 days:no  PCP - Lonie Peak, PA Cardiologist - n/a Vascular- Doreatha Massed, PA  Chest x-ray - n/a EKG - 04/22/22 Epic Stress Test - long time ago per pt ECHO - 03/19/05 Epic Cardiac Cath - n/a Pacemaker/ICD device last checked: n/a Spinal Cord Stimulator: n/a  Bowel Prep - no  Sleep Study - n/a CPAP -   Fasting Blood Sugar - preDM no per pt Checks Blood Sugar _____ times a day  Last dose of GLP1 agonist-  N/A GLP1 instructions:  N/A   Last dose of SGLT-2 inhibitors-  N/A SGLT-2 instructions: N/A   Blood Thinner Instructions:  Time Aspirin Instructions: ASA 81, hold 1 day Last Dose:  Activity level: Can go up a flight of stairs and perform activities of daily living without stopping and without symptoms of chest pain or shortness of breath.   Anesthesia review: celiac artery aneurysm, HTN, asthma  Patient denies shortness of breath, fever, cough and chest pain at PAT appointment  Patient verbalized understanding of instructions that were given to them at the PAT appointment. Patient was also instructed that they will need to review over the PAT instructions again at home before surgery.

## 2022-11-19 NOTE — Patient Instructions (Addendum)
Preparing for your Surgery  Plan for surgery on November 26, 2022 with Dr. Eugene Garnet at [facility]. You will be scheduled for modified radical vulvectomy, bilateral inguinal lymph node dissection with drain placement.   Plan for an overnight stay in the hospital at minimum.  Pre-operative Testing -(Done, today at 9 am) You will receive a phone call from presurgical testing at [facility] to arrange for a pre-operative appointment and lab work.  -Bring your insurance card, copy of an advanced directive if applicable, medication list  -At that visit, you will be asked to sign a consent for a possible blood transfusion in case a transfusion becomes necessary during surgery.  The need for a blood transfusion is rare but having consent is a necessary part of your care.     -You can keep taking your baby aspirin with the last dose being the day before surgery.  -Do not take supplements such as fish oil (omega 3), red yeast rice, turmeric before your surgery. You want to avoid medications with aspirin in them including headache powders such as BC or Goody's), Excedrin migraine. STOP FISH OIL NOW.  Day Before Surgery at Home -You can eat a regular diet the day before surgery. You will be advised you can have clear liquids up until 3 hours before your surgery.    Your role in recovery Your role is to become active as soon as directed by your doctor, while still giving yourself time to heal.  Rest when you feel tired. You will be asked to do the following in order to speed your recovery:  - Cough and breathe deeply. This helps to clear and expand your lungs and can prevent pneumonia after surgery.  - STAY ACTIVE WHEN YOU GET HOME. Do mild physical activity. Walking or moving your legs help your circulation and body functions return to normal. Do not try to get up or walk alone the first time after surgery.   -If you develop swelling on one leg or the other, pain in the back  of your leg, redness/warmth in one of your legs, please call the office or go to the Emergency Room to have a doppler to rule out a blood clot. For shortness of breath, chest pain-seek care in the Emergency Room as soon as possible. - Actively manage your pain. Managing your pain lets you move in comfort. We will ask you to rate your pain on a scale of zero to 10. It is your responsibility to tell your doctor or nurse where and how much you hurt so your pain can be treated.  Special Considerations -If you are diabetic, you may be placed on insulin after surgery to have closer control over your blood sugars to promote healing and recovery.  This does not mean that you will be discharged on insulin.  If applicable, your oral antidiabetics will be resumed when you are tolerating a solid diet.  -Your final pathology results from surgery should be available around one week after surgery and the results will be relayed to you when available.  -FMLA forms can be faxed to [phone] and please allow 5-7 business days for completion.  Pain Management After Surgery -You will be prescribed your pain medication and bowel regimen medications before surgery so that you can have these available when you are discharged from the hospital. The pain medication is for use ONLY AFTER surgery and a new prescription will not be given.   -Make sure that you have  Tylenol IF YOU ARE ABLE TO TAKE THESE MEDICATION at home to use on a regular basis after surgery for pain control.   -Review the attached handout on narcotic use and their risks and side effects.   Bowel Regimen -You will be prescribed Sennakot-S to take nightly to prevent constipation especially if you are taking the narcotic pain medication intermittently.  It is important to prevent constipation and drink adequate amounts of liquids. You can stop taking this medication when you are not taking pain medication and you are back on your normal bowel  routine.  Risks of Surgery Risks of surgery are low but include bleeding, infection, damage to surrounding structures, re-operation, blood clots, and very rarely death.   Blood Transfusion Information (For the consent to be signed before surgery)  We will be checking your blood type before surgery so in case of emergencies, we will know what type of blood you would need.                                            WHAT IS A BLOOD TRANSFUSION?  A transfusion is the replacement of blood or some of its parts. Blood is made up of multiple cells which provide different functions. Red blood cells carry oxygen and are used for blood loss replacement. White blood cells fight against infection. Platelets control bleeding. Plasma helps clot blood. Other blood products are available for specialized needs, such as hemophilia or other clotting disorders. BEFORE THE TRANSFUSION  Who gives blood for transfusions?  You may be able to donate blood to be used at a later date on yourself (autologous donation). Relatives can be asked to donate blood. This is generally not any safer than if you have received blood from a stranger. The same precautions are taken to ensure safety when a relative's blood is donated. Healthy volunteers who are fully evaluated to make sure their blood is safe. This is blood bank blood. Transfusion therapy is the safest it has ever been in the practice of medicine. Before blood is taken from a donor, a complete history is taken to make sure that person has no history of diseases nor engages in risky social behavior (examples are intravenous drug use or sexual activity with multiple partners). The donor's travel history is screened to minimize risk of transmitting infections, such as malaria. The donated blood is tested for signs of infectious diseases, such as HIV and hepatitis. The blood is then tested to be sure it is compatible with you in order to minimize the chance of a transfusion  reaction. If you or a relative donates blood, this is often done in anticipation of surgery and is not appropriate for emergency situations. It takes many days to process the donated blood. RISKS AND COMPLICATIONS Although transfusion therapy is very safe and saves many lives, the main dangers of transfusion include:  Getting an infectious disease. Developing a transfusion reaction. This is an allergic reaction to something in the blood you were given. Every precaution is taken to prevent this. The decision to have a blood transfusion has been considered carefully by your caregiver before blood is given. Blood is not given unless the benefits outweigh the risks.  AFTER SURGERY INSTRUCTIONS  Return to work: 4-6 weeks if applicable  We recommend purchasing several bags of frozen green peas and dividing them into ziploc bags. You will want to  keep these in the freezer and have them ready to use as ice packs to the vulvar incision. Once the ice pack is no longer cold, you can get another from the freezer. The frozen peas mold to your body better than a regular ice pack.   Activity: 1. Be up and out of the bed during the day.  Take a nap if needed.  You may walk up steps but be careful and use the hand rail.  Stair climbing will tire you more than you think, you may need to stop part way and rest.   2. No lifting or straining for 6 weeks over 10 pounds. No pushing, pulling, straining for 6 weeks.  3. No driving for around 1 week(s).  Do not drive if you are taking narcotic pain medicine and make sure that your reaction time has returned.   4. You can shower as soon as the next day after surgery. Shower daily.  Use your regular soap and water (not directly on the incision) and pat your incision(s) dry afterwards; don't rub.  No tub baths or submerging your body in water until cleared by your surgeon. If you have the soap that was given to you by pre-surgical testing that was used before surgery, you do  not need to use it afterwards because this can irritate your incisions.   5. No sexual activity and nothing in the vagina for 6 weeks.  6. You may experience a small amount of clear drainage from your incisions, which is normal.  If the drainage persists, increases, or changes color please call the office.  7. Do not use creams, lotions, or ointments such as neosporin on your incisions after surgery until advised by your surgeon because they can cause removal of the dermabond glue on your incisions.    8. You may experience vulvar spotting after surgery or when the stitches begin to dissolve.  The spotting is normal but if you experience heavy bleeding, call our office.  9. Take Tylenol first for pain if you are able to take these medication and only use narcotic pain medication for severe pain not relieved by the Tylenol.  Monitor your Tylenol intake to a max of 4,000 mg in a 24 hour period.   Diet: 1. Low sodium Heart Healthy Diet is recommended but you are cleared to resume your normal (before surgery) diet after your procedure.  2. It is safe to use a laxative, such as Miralax or Colace, if you have difficulty moving your bowels. You have been prescribed Sennakot-S to take at bedtime every evening after surgery to keep bowel movements regular and to prevent constipation.    Wound Care: 1. Keep clean and dry.  Shower daily.  Reasons to call the Doctor: Fever - Oral temperature greater than 100.4 degrees Fahrenheit Foul-smelling vaginal discharge Difficulty urinating Nausea and vomiting Increased pain at the site of the incision that is unrelieved with pain medicine. Difficulty breathing with or without chest pain New calf pain especially if only on one side Sudden, continuing increased vaginal bleeding with or without clots.   Contacts: For questions or concerns you should contact:  Dr. Eugene Garnet at [phone]  [name], NP at [phone]  After Hours: call  [phone] and have the GYN Oncologist paged/contacted (after 5 pm or on the weekends). You will speak with an after hours RN and let he or she know you have had surgery.  Messages sent via mychart are for non-urgent matters and are not  responded to after hours so for urgent needs, please call the after hours number.

## 2022-11-19 NOTE — Progress Notes (Signed)
Gynecologic Oncology Return Clinic Visit  11/20/22  Reason for Visit: follow-up after surgery, treatment planning   Treatment History: She had a palpable vulvar lump in the winter 2019 and 2020.  It became more noticeable in April 2020 and she attempted to have an appointment with a gynecologist however she could not get an appointment due to the the coronavirus shot downs.  She was eventually able to see a physician on August 25, 2018 at which time evaluation of the vulva confirmed a vulvar mass.  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 De Blanch.    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 09/27/18: Dr Duard Brady performed a radical anterior vulvectomy.  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  The resection margins were negative for carcinoma with the closest being the lateral margin 1:00 at 0.8 cm.  The carcinoma invaded 1.3 cm depth.  There was no lymphovascular or perineural invasion identified.   Postoperatively a PET/CT was obtained on October 06, 2018.  This revealed postoperative changes to the vulva.  There was no hypermetabolic local regional lymphadenopathy.  There is a questionable subtle focus of hypermetabolism in the left lobe of the liver, indeterminate for liver metastases.  MRI abdomen could be performed for further work-up.  No other potential findings of hypermetabolic distant metastatic disease were seen.   On 10/25/2018, the patient underwent bilateral superficial inguinal lymphadenectomy with fibroadipose tissue noted on the left and no carcinoma seen in 2 of 2 lymph nodes on the right.   The patient was seen in November with vulvar irritation that was intermittent in nature.  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  She was started on triamcinolone twice a day until her symptoms improved and then transition to daily.  I saw her at the end of December and she felt some improvement in her vulvar tenderness but still had significant erythema and irritation of the vulva.  I asked her to add Desitin or similar barrier cream.  When I saw her again in February, she had no improvement and had had significant irritation with the Desitin.  We stopped all topical treatment altogether.   ON 3/25, given persistent skin changes and no improvement in symptoms, vulvar biopsied was performed. Biopsy - high grade dysplasia.   06/15/19: partial simple left vulvectomy, partial simple peri-clitoral vulvectomy, vulvar biopsies, extensive CO2 laser ablation of the vulva. VIN3 noted in two vulvectomy specimens, positive margins of left specimen. Biopsies showed VIN1.    Treatment with vaginal/vulvar estrogen since surgery.   05/09/2020: Patient taken to the operating room for vulvar biopsies, lysis of vaginal agglutination, laser ablation of the vulva.  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   We used estrogen intermittently afterwards.   03/11/2021: Given continued vulvar erythema as well as desquamation, patient underwent EUA with vulvar biopsies and CO2 laser of the vulva.  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  Multiple biopsies showed high-grade vulvar dysplasia including vulvar biopsy at 1:00, 11:00, right periurethral, and left periclitoral.   03/25/2021: Given additional biopsies at her first surgery showing high-grade dysplasia that were not treated with laser ablation, additional CO2 laser of the vulva was recommended and performed.  Biopsies taken at the time of surgery showed focal acute inflammation  and reactive atypia in 3 of the biopsies.  Only the right vulva at 10:00 showed VIN 2-3.   04/22/22: vulvar biopsies, CO2 laser of the vulva.  Biopsies showed high grade  dysplasia, no invasive carcinoma.  10/01/22: Noted to have multiple new vulvar lesions. Biopsy performed on one lesion - pathology with VIN 2/3.  10/23/22: Exam under anesthesia, lysis of vulvar adhesions, multiple vulvar and perianal biopsies, partial simple right vulvectomy x3, vulvar laser ablation  Pathology A. 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 VULVA, RIGHT 11 O'CLOCK, BIOPSY: High-grade squamous intraepithelial lesion, VIN-3 involving the margins of the biopsy C. VULVA, LEFT 2 O'CLOCK, BIOPSY: High-grade squamous intraepithelial lesion, VIN-3 involving the margins of the biopsy D. 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 ANAL, POSTERIOR, BIOPSY: Hyperkeratosis with chronic inflammation and reactive changes Negative for dysplasia   Interval History: Doing well.   Still has some vulvar soreness.  Endorses normal bowel and bladder function.  Past Medical/Surgical History: Past Medical History:  Diagnosis Date   Celiac artery aneurysm (HCC) 2012   followed by vascular-- dr Myra Gianotti;  Theron Arista in epic 04-13-2022  stable no sig change since last visit   Complication of anesthesia    slow to wake up /  body temperature goes down   warm blankets with 1st surgery only   History of asthma    04-14-2022 per pt last had a rescue inhaler many yrs ago   History of cervical cancer 2008   s/p  TAH w/ BSO w/ pelvic lymphadenectomy for SCC of cervix   History of vaginal dysplasia    History of vulvar dysplasia 2020   with hx recurrence  s/p  bx's laser ablation and vulvectomy   Hyperlipidemia    Hypertension    Hypothyroidism    followed by pcp   Lichen sclerosus of vulva    PONV (postoperative nausea and vomiting)    NEEDS NAUSEA MED BEFORE SURGERY   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HCC)    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HCC)    Status post gamma knife treatment 03/2013   RIGHT V2  @ [facility] for trigeminal neuralgia    Past Surgical History:  Procedure Laterality Date   BREAST SURGERY Left    1970s  left breast cyst in areolar area   CATARACT EXTRACTION W/ INTRAOCULAR LENS IMPLANT Bilateral 2017   CO2 LASER APPLICATION N/A 06/15/2019   Procedure: C02 LASER OF VULVAR, VULVAR BIOPSIES,  SIMPLE VULVECTOMY;  Surgeon: Carver Fila, MD;  Location: [facility];  Service: Gynecology;  Laterality: N/A;  CO2 LASER APPLICATION N/A 05/09/2020   Procedure: VULVAR  LASER APPLICATION;  Surgeon: Carver Fila, MD;  Location: [facility];  Service: Gynecology;  Laterality: N/A;   CO2 LASER APPLICATION N/A 03/11/2021   Procedure: CO2 LASER APPLICATION TO VULVA;  Surgeon: Carver Fila, MD;  Location: [facility];  Service: Gynecology;  Laterality: N/A;   CO2 LASER APPLICATION N/A 03/25/2021   Procedure: CO2 LASER  APPLICATION TO VULVA;  Surgeon: Carver Fila, MD;  Location: [facility];  Service: Gynecology;  Laterality: N/A;   CO2 LASER APPLICATION N/A 04/22/2022   Procedure: CO2 LASER ABLATION TO VULVA;  Surgeon: Carver Fila, MD;  Location: [facility];  Service: Gynecology;  Laterality: N/A;   CO2 LASER APPLICATION N/A 10/23/2022   Procedure: CO2 LASER OF VULVA;  Surgeon: Carver Fila, MD;  Location: [facility];  Service: Gynecology;  Laterality: N/A;   LYMPH NODE DISSECTION Bilateral 10/25/2018   Procedure: LYMPH NODE DISSECTION,BILATERAL INGUINOFEMORAL LYMPHADECTOMY;  Surgeon: Cleda Mccreedy, MD;  Location: [facility];  Service: Gynecology;  Laterality: Bilateral;   RADICAL VULVECTOMY N/A 09/27/2018   Procedure: RADICAL VULVECTOMY;  Surgeon: Cleda Mccreedy, MD;  Location: [facility];  Service: Gynecology;  Laterality: N/A;   RETROMASTOID CRANIOTOMY Right 08/08/2020   @ [facility];   MCV DECOMPRESSION TRIGEMINAL NERVE   TOTAL ABDOMINAL HYSTERECTOMY W/ BILATERAL SALPINGOOPHORECTOMY  04/27/2006   @ [facility] by dr Loree Fee;   and Pelvic lymphadenectomy / insertion suprapubic cath   VULVA /PERINEUM BIOPSY N/A 05/09/2020   Procedure: VULVAR BIOPSY;  Surgeon: Carver Fila, MD;  Location: [facility];  Service: Gynecology;  Laterality: N/A;   VULVA Ples Specter BIOPSY N/A 03/11/2021   Procedure: VULVAR BIOPSY;  Surgeon: Carver Fila, MD;  Location: [facility];  Service: Gynecology;  Laterality: N/A;   VULVA Ples Specter BIOPSY N/A 03/25/2021   Procedure: VULVAR BIOPSY;  Surgeon: Carver Fila, MD;  Location: [facility];  Service: Gynecology;  Laterality: N/A;   VULVA Ples Specter BIOPSY N/A 04/22/2022   Procedure: VULVAR BIOPSY;  Surgeon: Carver Fila, MD;  Location: [facility];  Service: Gynecology;  Laterality: N/A;   VULVECTOMY N/A 10/23/2022   Procedure: WIDE EXCISION  VULVECTOMY;  Surgeon: Carver Fila, MD;  Location: [facility];  Service: Gynecology;  Laterality: N/A;    Family History  Problem Relation Age of Onset   Skin cancer Father     Social History   Socioeconomic History   Marital status: Widowed    Spouse name: Not on file   Number of children: Not on file   Years of education: Not on file   Highest education level: Not on file  Occupational History   Not on file  Tobacco Use   Smoking status: Former    Current packs/day: 0.00    Types: Cigarettes    Quit date: 12/08/2003    Years since quitting: 18.9    Passive exposure: Current   Smokeless tobacco: Never   Tobacco comments:    social  Vaping Use   Vaping status: Never Used  Substance and Sexual Activity   Alcohol use: No   Drug use: No   Sexual activity: Not Currently  Other Topics Concern   Not on file  Social History Narrative   Not on file   Social Determinants of Health   Financial Resource Strain: Not on file  Food Insecurity: Not on file  Transportation Needs: Not on file  Physical Activity: Not on file  Stress: Not on file  Social Connections: Not on file    Current Medications:  Current Outpatient Medications:    aspirin 81 MG chewable tablet, Chew 81 mg by mouth at bedtime., Disp: , Rfl:    Carboxymethylcellulose Sod PF 1 % GEL, Apply to eye 6 (six) times daily., Disp: , Rfl:    Cholecalciferol (VITAMIN D3) 50 MCG (2000 UT) TABS, Take 1 tablet by mouth every evening., Disp: , Rfl:    clobetasol ointment (TEMOVATE) 0.05 %, Apply 1 Application topically 2 (two) times daily. As instructed by your provider, Disp: 30 g, Rfl: 0   conjugated estrogens (PREMARIN) vaginal cream, APPLY DIME SIZE AMOUNT TO THE VULVA THREE TIMES A NIGHT, Disp: 30 g, Rfl: 3   hydrochlorothiazide (HYDRODIURIL) 25 MG tablet, Take 12.5 mg by mouth daily., Disp: , Rfl:    levothyroxine (SYNTHROID) 75 MCG tablet, Take 75 mcg by mouth daily before breakfast.  Alternates 50 mcg and 75 mcg, Disp: , Rfl:    lidocaine (XYLOCAINE) 5 % ointment, Apply 1 Application topically 3 (three) times daily as needed. Apply to the vulva, Disp: 35.44 g, Rfl: 1   Omega-3 Fatty Acids (FISH OIL) 1200 MG CAPS, Take 1,200 mg by mouth every evening. 6 tabs per day, Disp: , Rfl:    rosuvastatin (CRESTOR) 5 MG tablet, Take 5 mg by mouth daily after supper., Disp: , Rfl:    senna-docusate (SENOKOT-S) 8.6-50 MG tablet, Take 2 tablets by mouth at bedtime. For AFTER surgery, do not take if having diarrhea, Disp: 30 tablet, Rfl: 0   silver sulfADIAZINE (SILVADENE) 1 % cream, Apply 1 Application topically 2 (two) times daily. Apply to lasered area on the vulva, Disp: 50 g, Rfl: 0   traMADol (ULTRAM) 50 MG tablet, Take 1 tablet (50 mg total) by mouth every 6 (six) hours as needed for severe pain. For AFTER surgery, do not take and drive, Disp: 10 tablet, Rfl: 0  Review of Systems: Denies appetite changes, fevers, chills, fatigue, unexplained weight changes. Denies hearing loss, neck lumps or masses, mouth sores, ringing in ears or voice changes. Denies cough or wheezing.  Denies shortness of breath. Denies chest pain or palpitations. Denies leg swelling. Denies abdominal distention, pain, blood in stools, constipation, diarrhea, nausea, vomiting, or early satiety. Denies pain with intercourse, dysuria, frequency, hematuria or incontinence. Denies hot flashes, pelvic pain, vaginal bleeding or vaginal discharge.   Denies joint pain, back pain or muscle pain/cramps. Denies itching, rash, or wounds. Denies dizziness, headaches, numbness or seizures. Denies swollen lymph nodes or glands, denies easy bruising or bleeding. Denies anxiety, depression, confusion, or decreased concentration.  Physical Exam: BP 135/70 (BP Location: Left Arm, Patient Position: Sitting)   Pulse 66   Temp 97.9 F (36.6 C) (Oral)   Resp 18   Ht 5\' 5"  (1.651 m)   Wt 167 lb (75.8 kg)   SpO2 97%   BMI 27.79  kg/m  General: Alert, oriented, no acute distress. HEENT: Normocephalic, atraumatic, sclera anicteric. Chest: Unlabored breathing on room air.  Laboratory & Radiologic Studies: PET on 11/13/22: 1. No definite evidence of local recurrence or definite metastatic disease. 2. Nonspecific mildly hypermetabolic inguinal lymph nodes bilaterally, probably reactive. 3. Nonspecific midline perineal activity without CT correlate, likely urine contamination. 4. Nonspecific activity in the cecum and transverse colon without corresponding abnormality on the CT images, likely physiologic. Correlate with colon cancer screening history. 5.  Aortic Atherosclerosis (ICD10-I70.0).  Assessment & Plan: [name] is a 82 y.o. woman with recurrent multi-focal SCC of the vulva.  Discussed pathology again from recent surgery as well as PET scan.  Given multifocal lesions and depth of invasion, have recommended modified radical vulvectomy, bilateral inguinal lymph node dissection.  The patient previously went to superficial bilateral inguinal lymph node dissection.  There was no lymphatic tissue on 1 side and only 2 lymph nodes removed on the other.  There is evidence of mildly avid inguinal lymph nodes bilaterally, likely reactive.  I discussed that I think it is worth opening the space again to see if there is some lymphatic tissue we can remove.  This will help with planning and decisions regarding adjuvant treatment after her surgery.  We also discussed that all 3 biopsy showing invasive squamous cell carcinoma had margins that were negative for cancer but were positive for high-grade dysplasia.  Additionally, rest of biopsy showed high-grade dysplasia.  Goal will be to remove all of the affected area although a more superficial excision will be performed in the areas without invasive cancer on prior excision.  Discussed need for at least partial removal of the clitoris given anterior lesions.  We reviewed the plan  for modified radical vulvectomy, bilateral inguinal lymph node dissection, any other indicated procedures. The risks of surgery were discussed in detail and she understands these to include infection; wound separation; injury to adjacent organs; bleeding which may require blood transfusion; anesthesia risk; thromboembolic events; possible death; unforeseen complications; possible need for re-exploration; medical complications such as heart attack, stroke, pleural effusion and pneumonia; and, if full lymphadenectomy is performed the risk of lymphedema and lymphocyst.  Specifically, we discussed the need for postoperative drains in bilateral groins.  The patient will receive DVT and antibiotic prophylaxis as indicated. She voiced a clear understanding. She had the opportunity to ask questions. Perioperative instructions were reviewed with her. Prescriptions for post-op medications were sent to her pharmacy of choice.  24 minutes of total time was spent for this patient encounter, including preparation, face-to-face counseling with the patient and coordination of care, and documentation of the encounter.  Eugene Garnet, MD  Division of Gynecologic Oncology  Department of Obstetrics and Gynecology  [facility]

## 2022-11-19 NOTE — H&P (View-Only) (Signed)
Gynecologic Oncology Return Clinic Visit  11/20/22  Reason for Visit: follow-up after surgery, treatment planning   Treatment History: She had a palpable vulvar lump in the winter 2019 and 2020.  It became more noticeable in April 2020 and she attempted to have an appointment with a gynecologist however she could not get an appointment due to the the coronavirus shot downs.  She was eventually able to see a physician on August 25, 2018 at which time evaluation of the vulva confirmed a vulvar mass.  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 De Blanch.    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 09/27/18: Dr Duard Brady performed a radical anterior vulvectomy.  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  The resection margins were negative for carcinoma with the closest being the lateral margin 1:00 at 0.8 cm.  The carcinoma invaded 1.3 cm depth.  There was no lymphovascular or perineural invasion identified.   Postoperatively a PET/CT was obtained on October 06, 2018.  This revealed postoperative changes to the vulva.  There was no hypermetabolic local regional lymphadenopathy.  There is a questionable subtle focus of hypermetabolism in the left lobe of the liver, indeterminate for liver metastases.  MRI abdomen could be performed for further work-up.  No other potential findings of hypermetabolic distant metastatic disease were seen.   On 10/25/2018, the patient underwent bilateral superficial inguinal lymphadenectomy with fibroadipose tissue noted on the left and no carcinoma seen in 2 of 2 lymph nodes on the right.   The patient was seen in November with vulvar irritation that was intermittent in nature.  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  She was started on triamcinolone twice a day until her symptoms improved and then transition to daily.  I saw her at the end of December and she felt some improvement in her vulvar tenderness but still had significant erythema and irritation of the vulva.  I asked her to add Desitin or similar barrier cream.  When I saw her again in February, she had no improvement and had had significant irritation with the Desitin.  We stopped all topical treatment altogether.   ON 3/25, given persistent skin changes and no improvement in symptoms, vulvar biopsied was performed. Biopsy - high grade dysplasia.   06/15/19: partial simple left vulvectomy, partial simple peri-clitoral vulvectomy, vulvar biopsies, extensive CO2 laser ablation of the vulva. VIN3 noted in two vulvectomy specimens, positive margins of left specimen. Biopsies showed VIN1.    Treatment with vaginal/vulvar estrogen since surgery.   05/09/2020: Patient taken to the operating room for vulvar biopsies, lysis of vaginal agglutination, laser ablation of the vulva.  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   We used estrogen intermittently afterwards.   03/11/2021: Given continued vulvar erythema as well as desquamation, patient underwent EUA with vulvar biopsies and CO2 laser of the vulva.  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  Multiple biopsies showed high-grade vulvar dysplasia including vulvar biopsy at 1:00, 11:00, right periurethral, and left periclitoral.   03/25/2021: Given additional biopsies at her first surgery showing high-grade dysplasia that were not treated with laser ablation, additional CO2 laser of the vulva was recommended and performed.  Biopsies taken at the time of surgery showed focal acute inflammation  and reactive atypia in 3 of the biopsies.  Only the right vulva at 10:00 showed VIN 2-3.   04/22/22: vulvar biopsies, CO2 laser of the vulva.  Biopsies showed high grade  dysplasia, no invasive carcinoma.  10/01/22: Noted to have multiple new vulvar lesions. Biopsy performed on one lesion - pathology with VIN 2/3.  10/23/22: Exam under anesthesia, lysis of vulvar adhesions, multiple vulvar and perianal biopsies, partial simple right vulvectomy x3, vulvar laser ablation  Pathology A. 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 VULVA, RIGHT 11 O'CLOCK, BIOPSY: High-grade squamous intraepithelial lesion, VIN-3 involving the margins of the biopsy C. VULVA, LEFT 2 O'CLOCK, BIOPSY: High-grade squamous intraepithelial lesion, VIN-3 involving the margins of the biopsy D. 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 ANAL, POSTERIOR, BIOPSY: Hyperkeratosis with chronic inflammation and reactive changes Negative for dysplasia   Interval History: Doing well.   Still has some vulvar soreness.  Endorses normal bowel and bladder function.  Past Medical/Surgical History: Past Medical History:  Diagnosis Date   Celiac artery aneurysm (HCC) 2012   followed by vascular-- dr Myra Gianotti;  Theron Arista in epic 04-13-2022  stable no sig change since last visit   Complication of anesthesia    slow to wake up /  body temperature goes down   warm blankets with 1st surgery only   History of asthma    04-14-2022 per pt last had a rescue inhaler many yrs ago   History of cervical cancer 2008   s/p  TAH w/ BSO w/ pelvic lymphadenectomy for SCC of cervix   History of vaginal dysplasia    History of vulvar dysplasia 2020   with hx recurrence  s/p  bx's laser ablation and vulvectomy   Hyperlipidemia    Hypertension    Hypothyroidism    followed by pcp   Lichen sclerosus of vulva    PONV (postoperative nausea and vomiting)    NEEDS NAUSEA MED BEFORE SURGERY   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HCC)    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HCC)    Status post gamma knife treatment 03/2013   RIGHT V2  @ [facility] for trigeminal neuralgia    Past Surgical History:  Procedure Laterality Date   BREAST SURGERY Left    1970s  left breast cyst in areolar area   CATARACT EXTRACTION W/ INTRAOCULAR LENS IMPLANT Bilateral 2017   CO2 LASER APPLICATION N/A 06/15/2019   Procedure: C02 LASER OF VULVAR, VULVAR BIOPSIES,  SIMPLE VULVECTOMY;  Surgeon: Carver Fila, MD;  Location: [facility];  Service: Gynecology;  Laterality: N/A;  CO2 LASER APPLICATION N/A 05/09/2020   Procedure: VULVAR  LASER APPLICATION;  Surgeon: Carver Fila, MD;  Location: [facility];  Service: Gynecology;  Laterality: N/A;   CO2 LASER APPLICATION N/A 03/11/2021   Procedure: CO2 LASER APPLICATION TO VULVA;  Surgeon: Carver Fila, MD;  Location: [facility];  Service: Gynecology;  Laterality: N/A;   CO2 LASER APPLICATION N/A 03/25/2021   Procedure: CO2 LASER  APPLICATION TO VULVA;  Surgeon: Carver Fila, MD;  Location: [facility];  Service: Gynecology;  Laterality: N/A;   CO2 LASER APPLICATION N/A 04/22/2022   Procedure: CO2 LASER ABLATION TO VULVA;  Surgeon: Carver Fila, MD;  Location: [facility];  Service: Gynecology;  Laterality: N/A;   CO2 LASER APPLICATION N/A 10/23/2022   Procedure: CO2 LASER OF VULVA;  Surgeon: Carver Fila, MD;  Location: [facility];  Service: Gynecology;  Laterality: N/A;   LYMPH NODE DISSECTION Bilateral 10/25/2018   Procedure: LYMPH NODE DISSECTION,BILATERAL INGUINOFEMORAL LYMPHADECTOMY;  Surgeon: Cleda Mccreedy, MD;  Location: [facility];  Service: Gynecology;  Laterality: Bilateral;   RADICAL VULVECTOMY N/A 09/27/2018   Procedure: RADICAL VULVECTOMY;  Surgeon: Cleda Mccreedy, MD;  Location: [facility];  Service: Gynecology;  Laterality: N/A;   RETROMASTOID CRANIOTOMY Right 08/08/2020   @ [facility];   MCV DECOMPRESSION TRIGEMINAL NERVE   TOTAL ABDOMINAL HYSTERECTOMY W/ BILATERAL SALPINGOOPHORECTOMY  04/27/2006   @ [facility] by dr Loree Fee;   and Pelvic lymphadenectomy / insertion suprapubic cath   VULVA /PERINEUM BIOPSY N/A 05/09/2020   Procedure: VULVAR BIOPSY;  Surgeon: Carver Fila, MD;  Location: [facility];  Service: Gynecology;  Laterality: N/A;   VULVA Ples Specter BIOPSY N/A 03/11/2021   Procedure: VULVAR BIOPSY;  Surgeon: Carver Fila, MD;  Location: [facility];  Service: Gynecology;  Laterality: N/A;   VULVA Ples Specter BIOPSY N/A 03/25/2021   Procedure: VULVAR BIOPSY;  Surgeon: Carver Fila, MD;  Location: [facility];  Service: Gynecology;  Laterality: N/A;   VULVA Ples Specter BIOPSY N/A 04/22/2022   Procedure: VULVAR BIOPSY;  Surgeon: Carver Fila, MD;  Location: [facility];  Service: Gynecology;  Laterality: N/A;   VULVECTOMY N/A 10/23/2022   Procedure: WIDE EXCISION  VULVECTOMY;  Surgeon: Carver Fila, MD;  Location: [facility];  Service: Gynecology;  Laterality: N/A;    Family History  Problem Relation Age of Onset   Skin cancer Father     Social History   Socioeconomic History   Marital status: Widowed    Spouse name: Not on file   Number of children: Not on file   Years of education: Not on file   Highest education level: Not on file  Occupational History   Not on file  Tobacco Use   Smoking status: Former    Current packs/day: 0.00    Types: Cigarettes    Quit date: 12/08/2003    Years since quitting: 18.9    Passive exposure: Current   Smokeless tobacco: Never   Tobacco comments:    social  Vaping Use   Vaping status: Never Used  Substance and Sexual Activity   Alcohol use: No   Drug use: No   Sexual activity: Not Currently  Other Topics Concern   Not on file  Social History Narrative   Not on file   Social Determinants of Health   Financial Resource Strain: Not on file  Food Insecurity: Not on file  Transportation Needs: Not on file  Physical Activity: Not on file  Stress: Not on file  Social Connections: Not on file    Current Medications:  Current Outpatient Medications:    aspirin 81 MG chewable tablet, Chew 81 mg by mouth at bedtime., Disp: , Rfl:    Carboxymethylcellulose Sod PF 1 % GEL, Apply to eye 6 (six) times daily., Disp: , Rfl:    Cholecalciferol (VITAMIN D3) 50 MCG (2000 UT) TABS, Take 1 tablet by mouth every evening., Disp: , Rfl:    clobetasol ointment (TEMOVATE) 0.05 %, Apply 1 Application topically 2 (two) times daily. As instructed by your provider, Disp: 30 g, Rfl: 0   conjugated estrogens (PREMARIN) vaginal cream, APPLY DIME SIZE AMOUNT TO THE VULVA THREE TIMES A NIGHT, Disp: 30 g, Rfl: 3   hydrochlorothiazide (HYDRODIURIL) 25 MG tablet, Take 12.5 mg by mouth daily., Disp: , Rfl:    levothyroxine (SYNTHROID) 75 MCG tablet, Take 75 mcg by mouth daily before breakfast.  Alternates 50 mcg and 75 mcg, Disp: , Rfl:    lidocaine (XYLOCAINE) 5 % ointment, Apply 1 Application topically 3 (three) times daily as needed. Apply to the vulva, Disp: 35.44 g, Rfl: 1   Omega-3 Fatty Acids (FISH OIL) 1200 MG CAPS, Take 1,200 mg by mouth every evening. 6 tabs per day, Disp: , Rfl:    rosuvastatin (CRESTOR) 5 MG tablet, Take 5 mg by mouth daily after supper., Disp: , Rfl:    senna-docusate (SENOKOT-S) 8.6-50 MG tablet, Take 2 tablets by mouth at bedtime. For AFTER surgery, do not take if having diarrhea, Disp: 30 tablet, Rfl: 0   silver sulfADIAZINE (SILVADENE) 1 % cream, Apply 1 Application topically 2 (two) times daily. Apply to lasered area on the vulva, Disp: 50 g, Rfl: 0   traMADol (ULTRAM) 50 MG tablet, Take 1 tablet (50 mg total) by mouth every 6 (six) hours as needed for severe pain. For AFTER surgery, do not take and drive, Disp: 10 tablet, Rfl: 0  Review of Systems: Denies appetite changes, fevers, chills, fatigue, unexplained weight changes. Denies hearing loss, neck lumps or masses, mouth sores, ringing in ears or voice changes. Denies cough or wheezing.  Denies shortness of breath. Denies chest pain or palpitations. Denies leg swelling. Denies abdominal distention, pain, blood in stools, constipation, diarrhea, nausea, vomiting, or early satiety. Denies pain with intercourse, dysuria, frequency, hematuria or incontinence. Denies hot flashes, pelvic pain, vaginal bleeding or vaginal discharge.   Denies joint pain, back pain or muscle pain/cramps. Denies itching, rash, or wounds. Denies dizziness, headaches, numbness or seizures. Denies swollen lymph nodes or glands, denies easy bruising or bleeding. Denies anxiety, depression, confusion, or decreased concentration.  Physical Exam: BP 135/70 (BP Location: Left Arm, Patient Position: Sitting)   Pulse 66   Temp 97.9 F (36.6 C) (Oral)   Resp 18   Ht 5\' 5"  (1.651 m)   Wt 167 lb (75.8 kg)   SpO2 97%   BMI 27.79  kg/m  General: Alert, oriented, no acute distress. HEENT: Normocephalic, atraumatic, sclera anicteric. Chest: Unlabored breathing on room air.  Laboratory & Radiologic Studies: PET on 11/13/22: 1. No definite evidence of local recurrence or definite metastatic disease. 2. Nonspecific mildly hypermetabolic inguinal lymph nodes bilaterally, probably reactive. 3. Nonspecific midline perineal activity without CT correlate, likely urine contamination. 4. Nonspecific activity in the cecum and transverse colon without corresponding abnormality on the CT images, likely physiologic. Correlate with colon cancer screening history. 5.  Aortic Atherosclerosis (ICD10-I70.0).  Assessment & Plan: [name] is a 82 y.o. woman with recurrent multi-focal SCC of the vulva.  Discussed pathology again from recent surgery as well as PET scan.  Given multifocal lesions and depth of invasion, have recommended modified radical vulvectomy, bilateral inguinal lymph node dissection.  The patient previously went to superficial bilateral inguinal lymph node dissection.  There was no lymphatic tissue on 1 side and only 2 lymph nodes removed on the other.  There is evidence of mildly avid inguinal lymph nodes bilaterally, likely reactive.  I discussed that I think it is worth opening the space again to see if there is some lymphatic tissue we can remove.  This will help with planning and decisions regarding adjuvant treatment after her surgery.  We also discussed that all 3 biopsy showing invasive squamous cell carcinoma had margins that were negative for cancer but were positive for high-grade dysplasia.  Additionally, rest of biopsy showed high-grade dysplasia.  Goal will be to remove all of the affected area although a more superficial excision will be performed in the areas without invasive cancer on prior excision.  Discussed need for at least partial removal of the clitoris given anterior lesions.  We reviewed the plan  for modified radical vulvectomy, bilateral inguinal lymph node dissection, any other indicated procedures. The risks of surgery were discussed in detail and she understands these to include infection; wound separation; injury to adjacent organs; bleeding which may require blood transfusion; anesthesia risk; thromboembolic events; possible death; unforeseen complications; possible need for re-exploration; medical complications such as heart attack, stroke, pleural effusion and pneumonia; and, if full lymphadenectomy is performed the risk of lymphedema and lymphocyst.  Specifically, we discussed the need for postoperative drains in bilateral groins.  The patient will receive DVT and antibiotic prophylaxis as indicated. She voiced a clear understanding. She had the opportunity to ask questions. Perioperative instructions were reviewed with her. Prescriptions for post-op medications were sent to her pharmacy of choice.  24 minutes of total time was spent for this patient encounter, including preparation, face-to-face counseling with the patient and coordination of care, and documentation of the encounter.  Eugene Garnet, MD  Division of Gynecologic Oncology  Department of Obstetrics and Gynecology  [facility]

## 2022-11-20 ENCOUNTER — Inpatient Hospital Stay: Payer: Medicare Other | Attending: Gynecologic Oncology | Admitting: Gynecologic Oncology

## 2022-11-20 ENCOUNTER — Other Ambulatory Visit: Payer: Self-pay

## 2022-11-20 ENCOUNTER — Encounter: Payer: Self-pay | Admitting: Gynecologic Oncology

## 2022-11-20 ENCOUNTER — Encounter (HOSPITAL_COMMUNITY)
Admission: RE | Admit: 2022-11-20 | Discharge: 2022-11-20 | Disposition: A | Discharge status: Home / Self Care | Payer: Medicare Other | Source: Ambulatory Visit | Attending: Gynecologic Oncology | Admitting: Gynecologic Oncology

## 2022-11-20 ENCOUNTER — Encounter (HOSPITAL_COMMUNITY): Payer: Self-pay

## 2022-11-20 ENCOUNTER — Inpatient Hospital Stay (HOSPITAL_BASED_OUTPATIENT_CLINIC_OR_DEPARTMENT_OTHER): Payer: Medicare Other | Admitting: Gynecologic Oncology

## 2022-11-20 VITALS — BP 135/70 | HR 66 | Temp 97.9°F | Resp 18 | Ht 65.0 in | Wt 167.0 lb

## 2022-11-20 DIAGNOSIS — Z01812 Encounter for preprocedural laboratory examination: Secondary | ICD-10-CM | POA: Insufficient documentation

## 2022-11-20 DIAGNOSIS — Z9889 Other specified postprocedural states: Secondary | ICD-10-CM | POA: Diagnosis not present

## 2022-11-20 DIAGNOSIS — Z9079 Acquired absence of other genital organ(s): Secondary | ICD-10-CM | POA: Diagnosis not present

## 2022-11-20 DIAGNOSIS — Z90722 Acquired absence of ovaries, bilateral: Secondary | ICD-10-CM | POA: Insufficient documentation

## 2022-11-20 DIAGNOSIS — C519 Malignant neoplasm of vulva, unspecified: Secondary | ICD-10-CM | POA: Insufficient documentation

## 2022-11-20 DIAGNOSIS — Z90710 Acquired absence of both cervix and uterus: Secondary | ICD-10-CM | POA: Diagnosis not present

## 2022-11-20 DIAGNOSIS — Z7189 Other specified counseling: Secondary | ICD-10-CM

## 2022-11-20 LAB — COMPREHENSIVE METABOLIC PANEL
ALT: 12 U/L (ref 0–44)
AST: 17 U/L (ref 15–41)
Albumin: 3.7 g/dL (ref 3.5–5.0)
Alkaline Phosphatase: 60 U/L (ref 38–126)
Anion gap: 12 (ref 5–15)
BUN: 15 mg/dL (ref 8–23)
CO2: 21 mmol/L — ABNORMAL LOW (ref 22–32)
Calcium: 10.1 mg/dL (ref 8.9–10.3)
Chloride: 104 mmol/L (ref 98–111)
Creatinine, Ser: 0.89 mg/dL (ref 0.44–1.00)
GFR, Estimated: >60 mL/min (ref >60)
Glucose, Bld: 109 mg/dL — ABNORMAL HIGH (ref 70–99)
Potassium: 3.8 mmol/L (ref 3.5–5.1)
Sodium: 137 mmol/L (ref 135–145)
Total Bilirubin: 0.8 mg/dL (ref 0.3–1.2)
Total Protein: 7.5 g/dL (ref 6.5–8.1)

## 2022-11-20 LAB — CBC
HCT: 43.7 % (ref 36.0–46.0)
Hemoglobin: 13.8 g/dL (ref 12.0–15.0)
MCH: 29.6 pg (ref 26.0–34.0)
MCHC: 31.6 g/dL (ref 30.0–36.0)
MCV: 93.6 fL (ref 80.0–100.0)
Platelets: 150 10*3/uL (ref 150–400)
RBC: 4.67 MIL/uL (ref 3.87–5.11)
RDW: 15.9 % — ABNORMAL HIGH (ref 11.5–15.5)
WBC: 7.1 10*3/uL (ref 4.0–10.5)
nRBC: 0 % (ref 0.0–0.2)

## 2022-11-20 MED ORDER — SENNOSIDES-DOCUSATE SODIUM 8.6-50 MG PO TABS
2.0000 | ORAL_TABLET | Freq: Every day | ORAL | 0 refills | Status: DC
Start: 2022-11-20 — End: 2022-12-23

## 2022-11-20 MED ORDER — TRAMADOL HCL 50 MG PO TABS: 50.0000 mg | ORAL_TABLET | Freq: Four times a day (QID) | ORAL | 0 refills | Status: DC | PRN

## 2022-11-20 NOTE — Progress Notes (Signed)
Patient here with her daughter for follow up with Dr. Pricilla Holm and for a pre-operative appointment prior to her scheduled surgery on 11/26/2022. She is scheduled for modified radical vulvectomy, bilateral inguinal lymph node dissection with drain placement. She has her pre-admission testing appointment this am at [facility].  The surgery was discussed in detail.  See after visit summary for additional details. Visual aids used to discuss items related to surgery.   Discussed post-op pain management in detail including the aspects of the enhanced recovery pathway.  Advised her that a new prescription would be sent in for tramadol and it is only to be used for after her upcoming surgery.  We discussed the use of tylenol post-op and to monitor for a maximum of 4,000 mg in a 24 hour period.  Also prescribed sennakot to be used after surgery and to hold if having loose stools. She states the sennakot-S does not work for her and she uses prune juice which is helpful.  Discussed bowel regimen in detail.     Discussed measures to take at home to prevent DVT including frequent mobility.  Reportable signs and symptoms of DVT discussed. Post-operative instructions discussed and expectations for after surgery. Incisional care discussed as well including reportable signs and symptoms including erythema, drainage, wound separation. JP drain care discussed and information given.     10 minutes spent with the patient/preparing information.  Verbalizing understanding of material discussed. No needs or concerns voiced at the end of the visit.   Advised patient to call for any needs.  Advised that her post-operative medications had been prescribed and could be picked up at any time.    This appointment is included in the global surgical bundle as pre-operative teaching and has no charge.

## 2022-11-25 ENCOUNTER — Telehealth: Payer: Self-pay | Admitting: *Deleted

## 2022-11-25 NOTE — Telephone Encounter (Signed)
Attempted to reach [name] for pre-op call. Left voicemail requesting call back.

## 2022-11-25 NOTE — Telephone Encounter (Signed)
2nd attempt to reach patient for pre-op call. Left voicemail requesting call back.

## 2022-11-25 NOTE — Telephone Encounter (Signed)
Telephone call to check on pre-operative status.  Patient compliant with pre-operative instructions.  Reinforced nothing to eat after midnight. Clear liquids until 0845. Patient to arrive at 0945.  No questions or concerns voiced.  Instructed to call for any needs.

## 2022-11-25 NOTE — Anesthesia Preprocedure Evaluation (Addendum)
Anesthesia Evaluation  Patient identified by MRN, date of birth, ID band Patient awake    Reviewed: Allergy & Precautions, NPO status , Patient's Chart, lab work & pertinent test results  History of Anesthesia Complications (+) PONV, PROLONGED EMERGENCE and history of anesthetic complications  Airway Mallampati: III  TM Distance: >3 FB Neck ROM: Full    Dental no notable dental hx. (+) Dental Advisory Given   Pulmonary neg shortness of breath, asthma (has not needed inhaler in years) , neg sleep apnea, neg COPD, neg recent URI, former smoker   Pulmonary exam normal breath sounds clear to auscultation       Cardiovascular hypertension, Pt. on medications (-) angina (-) Past MI, (-) Cardiac Stents and (-) CABG Normal cardiovascular exam(-) dysrhythmias  Rhythm:Regular Rate:Normal  HLD, celiac artery aneurysm   Neuro/Psych negative neurological ROS     GI/Hepatic negative GI ROS, Neg liver ROS,,,  Endo/Other  neg diabetesHypothyroidism    Renal/GU negative Renal ROS  Female genitourinary complaint: , Codeine, LAtex.     Musculoskeletal  (+) Arthritis ,    Abdominal   Peds  Hematology negative hematology ROS (+)   Anesthesia Other Findings All: Prednisone, codeine Latex   Reproductive/Obstetrics H/o cervical cancer s/p TAH w/BSO, recurrent vulvar cancer                             Anesthesia Physical Anesthesia Plan  ASA: 3  Anesthesia Plan: General   Post-op Pain Management: Tylenol PO (pre-op)* and Precedex   Induction: Intravenous  PONV Risk Score and Plan: 4 or greater and Ondansetron, Dexamethasone, TIVA, Treatment may vary due to age or medical condition and Propofol infusion  Airway Management Planned: Oral ETT  Additional Equipment: None  Intra-op Plan:   Post-operative Plan: Extubation in OR  Informed Consent: I have reviewed the patients History and Physical, chart,  labs and discussed the procedure including the risks, benefits and alternatives for the proposed anesthesia with the patient or authorized representative who has indicated his/her understanding and acceptance.     Dental advisory given  Plan Discussed with: Anesthesiologist and CRNA  Anesthesia Plan Comments: (TIVA GA)       Anesthesia Quick Evaluation

## 2022-11-26 ENCOUNTER — Other Ambulatory Visit: Payer: Self-pay

## 2022-11-26 ENCOUNTER — Inpatient Hospital Stay (HOSPITAL_COMMUNITY)
Admission: RE | Admit: 2022-11-26 | Discharge: 2022-11-27 | DRG: 747 | Disposition: A | Discharge status: Home / Self Care | Payer: Medicare Other | Attending: Gynecologic Oncology | Admitting: Gynecologic Oncology

## 2022-11-26 ENCOUNTER — Inpatient Hospital Stay (HOSPITAL_COMMUNITY): Payer: Medicare Other | Admitting: Physician Assistant

## 2022-11-26 ENCOUNTER — Inpatient Hospital Stay (HOSPITAL_COMMUNITY): Payer: Medicare Other | Admitting: Anesthesiology

## 2022-11-26 ENCOUNTER — Encounter (HOSPITAL_COMMUNITY): Payer: Self-pay | Admitting: Gynecologic Oncology

## 2022-11-26 ENCOUNTER — Encounter (HOSPITAL_COMMUNITY)
Admission: RE | Disposition: A | Discharge status: Home / Self Care | Payer: Self-pay | Source: Home / Self Care | Attending: Gynecologic Oncology

## 2022-11-26 DIAGNOSIS — I959 Hypotension, unspecified: Secondary | ICD-10-CM | POA: Diagnosis not present

## 2022-11-26 DIAGNOSIS — C519 Malignant neoplasm of vulva, unspecified: Secondary | ICD-10-CM

## 2022-11-26 DIAGNOSIS — Z86002 Personal history of in-situ neoplasm of other and unspecified genital organs: Secondary | ICD-10-CM

## 2022-11-26 DIAGNOSIS — Z79899 Other long term (current) drug therapy: Secondary | ICD-10-CM

## 2022-11-26 DIAGNOSIS — Z79890 Hormone replacement therapy (postmenopausal): Secondary | ICD-10-CM | POA: Diagnosis not present

## 2022-11-26 DIAGNOSIS — Z91040 Latex allergy status: Secondary | ICD-10-CM

## 2022-11-26 DIAGNOSIS — Z87891 Personal history of nicotine dependence: Secondary | ICD-10-CM

## 2022-11-26 DIAGNOSIS — Z90710 Acquired absence of both cervix and uterus: Secondary | ICD-10-CM

## 2022-11-26 DIAGNOSIS — E039 Hypothyroidism, unspecified: Secondary | ICD-10-CM | POA: Diagnosis present

## 2022-11-26 DIAGNOSIS — Z90722 Acquired absence of ovaries, bilateral: Secondary | ICD-10-CM | POA: Diagnosis not present

## 2022-11-26 DIAGNOSIS — Z8544 Personal history of malignant neoplasm of other female genital organs: Secondary | ICD-10-CM | POA: Diagnosis not present

## 2022-11-26 DIAGNOSIS — I10 Essential (primary) hypertension: Secondary | ICD-10-CM | POA: Diagnosis present

## 2022-11-26 DIAGNOSIS — J45909 Unspecified asthma, uncomplicated: Secondary | ICD-10-CM | POA: Diagnosis present

## 2022-11-26 DIAGNOSIS — Z888 Allergy status to other drugs, medicaments and biological substances status: Secondary | ICD-10-CM | POA: Diagnosis not present

## 2022-11-26 DIAGNOSIS — Z7982 Long term (current) use of aspirin: Secondary | ICD-10-CM

## 2022-11-26 DIAGNOSIS — J449 Chronic obstructive pulmonary disease, unspecified: Secondary | ICD-10-CM | POA: Diagnosis not present

## 2022-11-26 DIAGNOSIS — Z808 Family history of malignant neoplasm of other organs or systems: Secondary | ICD-10-CM

## 2022-11-26 DIAGNOSIS — N904 Leukoplakia of vulva: Secondary | ICD-10-CM | POA: Diagnosis present

## 2022-11-26 DIAGNOSIS — Z8541 Personal history of malignant neoplasm of cervix uteri: Secondary | ICD-10-CM

## 2022-11-26 DIAGNOSIS — Z885 Allergy status to narcotic agent status: Secondary | ICD-10-CM | POA: Diagnosis not present

## 2022-11-26 DIAGNOSIS — E785 Hyperlipidemia, unspecified: Secondary | ICD-10-CM | POA: Diagnosis present

## 2022-11-26 HISTORY — PX: INGUINAL LYMPHADENECTOMY: SHX6587

## 2022-11-26 HISTORY — PX: RADICAL VULVECTOMY: SHX6584

## 2022-11-26 LAB — TYPE AND SCREEN
ABO/RH(D): O POS
Antibody Screen: NEGATIVE

## 2022-11-26 SURGERY — VULVECTOMY, RADICAL
Anesthesia: General

## 2022-11-26 MED ORDER — HEPARIN SODIUM (PORCINE) 5000 UNIT/ML IJ SOLN
5000.0000 [IU] | INTRAMUSCULAR | Status: AC
  Administered 2022-11-26: 5000 [IU] via SUBCUTANEOUS
  Filled 2022-11-26: qty 1

## 2022-11-26 MED ORDER — ACETAMINOPHEN 500 MG PO TABS
1000.0000 mg | ORAL_TABLET | Freq: Four times a day (QID) | ORAL | Status: DC
  Administered 2022-11-26 – 2022-11-27 (×3): 1000 mg via ORAL
  Filled 2022-11-26 (×3): qty 2

## 2022-11-26 MED ORDER — BUPIVACAINE HCL 0.25 % IJ SOLN
INTRAMUSCULAR | Status: AC
  Filled 2022-11-26: qty 1

## 2022-11-26 MED ORDER — LEVOTHYROXINE SODIUM 75 MCG PO TABS
75.0000 ug | ORAL_TABLET | Freq: Every day | ORAL | Status: DC
Start: 2022-11-27 — End: 2022-11-26

## 2022-11-26 MED ORDER — FENTANYL CITRATE (PF) 100 MCG/2ML IJ SOLN
INTRAMUSCULAR | Status: DC | PRN
  Administered 2022-11-26 (×4): 50 ug via INTRAVENOUS

## 2022-11-26 MED ORDER — VITAMIN D 25 MCG (1000 UNIT) PO TABS
2000.0000 [IU] | ORAL_TABLET | Freq: Every evening | ORAL | Status: DC
  Administered 2022-11-26: 2000 [IU] via ORAL
  Filled 2022-11-26: qty 2

## 2022-11-26 MED ORDER — SUGAMMADEX SODIUM 200 MG/2ML IV SOLN
INTRAVENOUS | Status: DC | PRN
Start: 2022-11-26 — End: 2022-11-26
  Administered 2022-11-26: 175 mg via INTRAVENOUS

## 2022-11-26 MED ORDER — LEVOTHYROXINE SODIUM 50 MCG PO TABS: 50.0000 ug | ORAL_TABLET | ORAL | Status: DC

## 2022-11-26 MED ORDER — BUPIVACAINE LIPOSOME 1.3 % IJ SUSP
INTRAMUSCULAR | Status: DC | PRN
  Administered 2022-11-26: 20 mL

## 2022-11-26 MED ORDER — ENOXAPARIN SODIUM 40 MG/0.4ML IJ SOSY: 40.0000 mg | PREFILLED_SYRINGE | INTRAMUSCULAR | Status: DC

## 2022-11-26 MED ORDER — SENNOSIDES-DOCUSATE SODIUM 8.6-50 MG PO TABS
2.0000 | ORAL_TABLET | Freq: Every day | ORAL | Status: DC
  Administered 2022-11-26: 2 via ORAL
  Filled 2022-11-26: qty 2

## 2022-11-26 MED ORDER — HYDROCODONE-ACETAMINOPHEN 5-325 MG PO TABS: 1.0000 | ORAL_TABLET | ORAL | Status: DC | PRN

## 2022-11-26 MED ORDER — PROPOFOL 10 MG/ML IV BOLUS
INTRAVENOUS | Status: DC | PRN
  Administered 2022-11-26: 110 mg via INTRAVENOUS
  Administered 2022-11-26: 150 ug/kg/min via INTRAVENOUS

## 2022-11-26 MED ORDER — PROPOFOL 10 MG/ML IV BOLUS
INTRAVENOUS | Status: AC
  Filled 2022-11-26: qty 20

## 2022-11-26 MED ORDER — DEXAMETHASONE SODIUM PHOSPHATE 4 MG/ML IJ SOLN: 4.0000 mg | INTRAMUSCULAR | Status: DC

## 2022-11-26 MED ORDER — LACTATED RINGERS IV SOLN: INTRAVENOUS | Status: DC

## 2022-11-26 MED ORDER — CARBOXYMETHYLCELLUL-GLYCERIN 0.5-0.9 % OP SOLN
1.0000 [drp] | Freq: Every day | OPHTHALMIC | Status: DC
  Administered 2022-11-26 – 2022-11-27 (×4): 1 [drp] via OPHTHALMIC
  Filled 2022-11-26: qty 15

## 2022-11-26 MED ORDER — CEFAZOLIN SODIUM-DEXTROSE 2-4 GM/100ML-% IV SOLN
2.0000 g | INTRAVENOUS | Status: AC
  Administered 2022-11-26: 2 g via INTRAVENOUS
  Filled 2022-11-26: qty 100

## 2022-11-26 MED ORDER — ORAL CARE MOUTH RINSE: 15.0000 mL | Freq: Once | OROMUCOSAL | Status: AC

## 2022-11-26 MED ORDER — EPHEDRINE 5 MG/ML INJ
INTRAVENOUS | Status: AC
  Filled 2022-11-26: qty 5

## 2022-11-26 MED ORDER — EPHEDRINE SULFATE (PRESSORS) 50 MG/ML IJ SOLN
INTRAMUSCULAR | Status: DC | PRN
Start: 2022-11-26 — End: 2022-11-26
  Administered 2022-11-26: 5 mg via INTRAVENOUS
  Administered 2022-11-26: 10 mg via INTRAVENOUS

## 2022-11-26 MED ORDER — FENTANYL CITRATE (PF) 100 MCG/2ML IJ SOLN
INTRAMUSCULAR | Status: AC
  Filled 2022-11-26: qty 2

## 2022-11-26 MED ORDER — FENTANYL CITRATE PF 50 MCG/ML IJ SOSY
PREFILLED_SYRINGE | INTRAMUSCULAR | Status: AC
  Filled 2022-11-26: qty 2

## 2022-11-26 MED ORDER — PROPOFOL 500 MG/50ML IV EMUL
INTRAVENOUS | Status: AC
  Filled 2022-11-26: qty 50

## 2022-11-26 MED ORDER — ROSUVASTATIN CALCIUM 5 MG PO TABS
5.0000 mg | ORAL_TABLET | Freq: Every day | ORAL | Status: DC
  Administered 2022-11-26: 5 mg via ORAL
  Filled 2022-11-26: qty 1

## 2022-11-26 MED ORDER — PROPOFOL 1000 MG/100ML IV EMUL
INTRAVENOUS | Status: AC
  Filled 2022-11-26: qty 100

## 2022-11-26 MED ORDER — BUPIVACAINE HCL 0.25 % IJ SOLN
INTRAMUSCULAR | Status: DC | PRN
  Administered 2022-11-26: 20 mL

## 2022-11-26 MED ORDER — LEVOTHYROXINE SODIUM 75 MCG PO TABS
75.0000 ug | ORAL_TABLET | ORAL | Status: DC
  Administered 2022-11-27: 75 ug via ORAL
  Filled 2022-11-26: qty 1

## 2022-11-26 MED ORDER — DEXMEDETOMIDINE HCL IN NACL 80 MCG/20ML IV SOLN
INTRAVENOUS | Status: DC | PRN
Start: 2022-11-26 — End: 2022-11-26
  Administered 2022-11-26 (×3): 4 ug via INTRAVENOUS

## 2022-11-26 MED ORDER — TRAMADOL HCL 50 MG PO TABS: 50.0000 mg | ORAL_TABLET | Freq: Four times a day (QID) | ORAL | Status: DC | PRN

## 2022-11-26 MED ORDER — LIDOCAINE HCL (CARDIAC) PF 100 MG/5ML IV SOSY
PREFILLED_SYRINGE | INTRAVENOUS | Status: DC | PRN
  Administered 2022-11-26: 60 mg via INTRAVENOUS

## 2022-11-26 MED ORDER — KCL IN DEXTROSE-NACL 10-5-0.45 MEQ/L-%-% IV SOLN
INTRAVENOUS | Status: DC
  Filled 2022-11-26: qty 1000

## 2022-11-26 MED ORDER — 0.9 % SODIUM CHLORIDE (POUR BTL) OPTIME
TOPICAL | Status: DC | PRN
Start: 2022-11-26 — End: 2022-11-26
  Administered 2022-11-26: 1000 mL

## 2022-11-26 MED ORDER — ROCURONIUM BROMIDE 100 MG/10ML IV SOLN
INTRAVENOUS | Status: DC | PRN
Start: 2022-11-26 — End: 2022-11-26
  Administered 2022-11-26: 10 mg via INTRAVENOUS
  Administered 2022-11-26: 50 mg via INTRAVENOUS
  Administered 2022-11-26 (×2): 20 mg via INTRAVENOUS

## 2022-11-26 MED ORDER — ACETAMINOPHEN 500 MG PO TABS
1000.0000 mg | ORAL_TABLET | ORAL | Status: AC
  Administered 2022-11-26: 1000 mg via ORAL
  Filled 2022-11-26: qty 2

## 2022-11-26 MED ORDER — ONDANSETRON HCL 4 MG PO TABS: 4.0000 mg | ORAL_TABLET | Freq: Four times a day (QID) | ORAL | Status: DC | PRN

## 2022-11-26 MED ORDER — CHLORHEXIDINE GLUCONATE 0.12 % MT SOLN
15.0000 mL | Freq: Once | OROMUCOSAL | Status: AC
  Administered 2022-11-26: 15 mL via OROMUCOSAL

## 2022-11-26 MED ORDER — ONDANSETRON HCL 4 MG/2ML IJ SOLN
INTRAMUSCULAR | Status: DC | PRN
  Administered 2022-11-26: 4 mg via INTRAVENOUS

## 2022-11-26 MED ORDER — FENTANYL CITRATE PF 50 MCG/ML IJ SOSY
25.0000 ug | PREFILLED_SYRINGE | INTRAMUSCULAR | Status: DC | PRN
  Administered 2022-11-26: 50 ug via INTRAVENOUS

## 2022-11-26 MED ORDER — BUPIVACAINE LIPOSOME 1.3 % IJ SUSP
INTRAMUSCULAR | Status: AC
  Filled 2022-11-26: qty 20

## 2022-11-26 MED ORDER — ONDANSETRON HCL 4 MG/2ML IJ SOLN: 4.0000 mg | Freq: Once | INTRAMUSCULAR | Status: DC | PRN

## 2022-11-26 MED ORDER — ACETAMINOPHEN 10 MG/ML IV SOLN: 1000.0000 mg | Freq: Once | INTRAVENOUS | Status: DC | PRN

## 2022-11-26 MED ORDER — DEXAMETHASONE SODIUM PHOSPHATE 4 MG/ML IJ SOLN: INTRAMUSCULAR | Status: DC | PRN

## 2022-11-26 MED ORDER — ONDANSETRON HCL 4 MG/2ML IJ SOLN: 4.0000 mg | Freq: Four times a day (QID) | INTRAMUSCULAR | Status: DC | PRN

## 2022-11-26 SURGICAL SUPPLY — 82 items
ADH SKN CLS APL DERMABOND .7 (GAUZE/BANDAGES/DRESSINGS) ×4
APL PRP STRL LF DISP 70% ISPRP (MISCELLANEOUS) ×2
APPLIER CLIP 9.375 MED OPEN (MISCELLANEOUS) ×2
APR CLP MED 9.3 20 MLT OPN (MISCELLANEOUS) ×2
BAG COUNTER SPONGE SURGICOUNT (BAG) IMPLANT
BAG SPNG CNTER NS LX DISP (BAG)
BLADE SURG 15 STRL LF DISP TIS (BLADE) ×2 IMPLANT
BLADE SURG 15 STRL SS (BLADE) ×2
BNDG GAUZE DERMACEA FLUFF 4 (GAUZE/BANDAGES/DRESSINGS) IMPLANT
BNDG GZE DERMACEA 4 6PLY (GAUZE/BANDAGES/DRESSINGS)
CHLORAPREP W/TINT 26 (MISCELLANEOUS) ×2 IMPLANT
CLIP APPLIE 9.375 MED OPEN (MISCELLANEOUS) IMPLANT
CLIP TI LARGE 6 (CLIP) IMPLANT
CLIP TI MEDIUM 6 (CLIP) ×2 IMPLANT
CLIP TI MEDIUM LARGE 6 (CLIP) IMPLANT
COVER SURGICAL LIGHT HANDLE (MISCELLANEOUS) ×2 IMPLANT
DERMABOND ADVANCED .7 DNX12 (GAUZE/BANDAGES/DRESSINGS) ×2 IMPLANT
DRAIN CHANNEL 10F 3/8 F FF (DRAIN) IMPLANT
DRAPE HYSTEROSCOPY (MISCELLANEOUS) ×2 IMPLANT
DRAPE LAPAROTOMY T 98X78 PEDS (DRAPES) ×2 IMPLANT
DRAPE SHEET LG 3/4 BI-LAMINATE (DRAPES) ×4 IMPLANT
DRAPE UTILITY XL STRL (DRAPES) IMPLANT
DRSG TEGADERM 4X4.75 (GAUZE/BANDAGES/DRESSINGS) IMPLANT
DRSG TELFA 3X8 NADH STRL (GAUZE/BANDAGES/DRESSINGS) ×2 IMPLANT
ELECT REM PT RETURN 15FT ADLT (MISCELLANEOUS) ×2 IMPLANT
EVACUATOR SILICONE 100CC (DRAIN) IMPLANT
GAUZE 4X4 16PLY ~~LOC~~+RFID DBL (SPONGE) ×2 IMPLANT
GAUZE SPONGE 2X2 8PLY STRL LF (GAUZE/BANDAGES/DRESSINGS) IMPLANT
GAUZE SPONGE 4X4 12PLY STRL (GAUZE/BANDAGES/DRESSINGS) ×2 IMPLANT
GLOVE BIO SURGEON STRL SZ 6 (GLOVE) ×4 IMPLANT
GLOVE BIO SURGEON STRL SZ 6.5 (GLOVE) ×2 IMPLANT
GOWN STRL REUS W/ TWL LRG LVL3 (GOWN DISPOSABLE) ×4 IMPLANT
GOWN STRL REUS W/TWL LRG LVL3 (GOWN DISPOSABLE) ×4
KIT BASIN OR (CUSTOM PROCEDURE TRAY) ×2 IMPLANT
KIT TURNOVER KIT A (KITS) IMPLANT
LEGGING LITHOTOMY PAIR STRL (DRAPES) ×2 IMPLANT
NDL HYPO 21X1.5 SAFETY (NEEDLE) ×2 IMPLANT
NDL HYPO 22X1.5 SAFETY MO (MISCELLANEOUS) ×2 IMPLANT
NDL SPNL 22GX7 QUINCKE BK (NEEDLE) IMPLANT
NEEDLE HYPO 21X1.5 SAFETY (NEEDLE) ×2
NEEDLE HYPO 22X1.5 SAFETY MO (MISCELLANEOUS) ×2
NEEDLE SPNL 22GX7 QUINCKE BK (NEEDLE)
NS IRRIG 1000ML POUR BTL (IV SOLUTION) ×2 IMPLANT
PACK GENERAL/GYN (CUSTOM PROCEDURE TRAY) ×2 IMPLANT
PACK LITHOTOMY IV (CUSTOM PROCEDURE TRAY) ×2 IMPLANT
PACK PERINEAL COLD (PAD) IMPLANT
PENCIL SMOKE EVACUATOR (MISCELLANEOUS) IMPLANT
SCOPETTES 8 STERILE (MISCELLANEOUS) IMPLANT
SCRUB CHG 4% DYNA-HEX 4OZ (MISCELLANEOUS) IMPLANT
SHEARS HARMONIC 9CM CVD (BLADE) IMPLANT
SHEET LAVH (DRAPES) IMPLANT
SOL PREP POV-IOD 4OZ 10% (MISCELLANEOUS) ×2 IMPLANT
SPIKE FLUID TRANSFER (MISCELLANEOUS) ×2 IMPLANT
SPONGE DRAIN TRACH 4X4 STRL 2S (GAUZE/BANDAGES/DRESSINGS) IMPLANT
SURGILUBE 2OZ TUBE FLIPTOP (MISCELLANEOUS) ×2 IMPLANT
SUT ETHILON 3 0 PS 1 (SUTURE) IMPLANT
SUT MNCRL AB 4-0 PS2 18 (SUTURE) ×2 IMPLANT
SUT SILK 2 0 (SUTURE) ×2
SUT SILK 2-0 18XBRD TIE 12 (SUTURE) ×2 IMPLANT
SUT SILK 3 0 (SUTURE)
SUT SILK 3-0 18XBRD TIE 12 (SUTURE) IMPLANT
SUT VIC AB 0 CT1 27 (SUTURE)
SUT VIC AB 0 CT1 27XBRD ANTBC (SUTURE) IMPLANT
SUT VIC AB 2-0 CT1 27 (SUTURE)
SUT VIC AB 2-0 CT1 TAPERPNT 27 (SUTURE) IMPLANT
SUT VIC AB 2-0 SH 18 (SUTURE) IMPLANT
SUT VIC AB 2-0 SH 27 (SUTURE) ×12
SUT VIC AB 2-0 SH 27X BRD (SUTURE) IMPLANT
SUT VIC AB 3-0 CT1 36 (SUTURE) IMPLANT
SUT VIC AB 3-0 SH 18 (SUTURE) IMPLANT
SUT VIC AB 3-0 SH 27 (SUTURE) ×6
SUT VIC AB 3-0 SH 27X BRD (SUTURE) IMPLANT
SUT VIC AB 3-0 SH 27XBRD (SUTURE) IMPLANT
SUT VIC AB 4-0 PS2 27 (SUTURE) IMPLANT
SYR 20ML LL LF (SYRINGE) ×2 IMPLANT
SYR BULB IRRIG 60ML STRL (SYRINGE) IMPLANT
SYR CONTROL 10ML LL (SYRINGE) ×2 IMPLANT
TOWEL OR 17X26 10 PK STRL BLUE (TOWEL DISPOSABLE) ×2 IMPLANT
TRAY FOLEY MTR SLVR 16FR STAT (SET/KITS/TRAYS/PACK) IMPLANT
UNDERPAD 30X36 HEAVY ABSORB (UNDERPADS AND DIAPERS) ×2 IMPLANT
WATER STERILE IRR 1000ML POUR (IV SOLUTION) ×2 IMPLANT
YANKAUER SUCT BULB TIP 10FT TU (MISCELLANEOUS) IMPLANT

## 2022-11-26 NOTE — Anesthesia Postprocedure Evaluation (Signed)
Anesthesia Post Note  Patient: [name]  Procedure(s) Performed: MODIFIED RADICAL VULVECTOMY INGUINAL LYMPHADENECTOMY (Bilateral)     Patient location during evaluation: PACU Anesthesia Type: General Level of consciousness: sedated, patient cooperative and oriented Pain management: pain level controlled Vital Signs Assessment: post-procedure vital signs reviewed and stable Respiratory status: spontaneous breathing, nonlabored ventilation and respiratory function stable Cardiovascular status: blood pressure returned to baseline and stable Postop Assessment: no apparent nausea or vomiting Anesthetic complications: no   No notable events documented.  Last Vitals:  Vitals:   11/26/22 1700 11/26/22 1715  BP: 117/65   Pulse: (!) 48   Resp: (!) 5 18  Temp: (!) 36.4 C   SpO2: 100%     Last Pain:  Vitals:   11/26/22 1700  TempSrc:   PainSc: 3                  Hermione Havlicek,E. Amylynn Fano

## 2022-11-26 NOTE — Anesthesia Procedure Notes (Signed)
Procedure Name: Intubation Date/Time: 11/26/2022 12:45 PM  Performed by: Theodosia Quay, CRNAPre-anesthesia Checklist: Patient identified, Emergency Drugs available, Suction available, Patient being monitored and Timeout performed Patient Re-evaluated:Patient Re-evaluated prior to induction Oxygen Delivery Method: Circle system utilized Preoxygenation: Pre-oxygenation with 100% oxygen Induction Type: IV induction Ventilation: Mask ventilation without difficulty Laryngoscope Size: Mac and 3 Grade View: Grade II Tube type: Oral Tube size: 7.0 mm Number of attempts: 1 Airway Equipment and Method: Stylet Placement Confirmation: ETT inserted through vocal cords under direct vision, positive ETCO2, CO2 detector and breath sounds checked- equal and bilateral Secured at: 21 cm Tube secured with: Tape Dental Injury: Teeth and Oropharynx as per pre-operative assessment  Comments: ATOI, small mouth opening.

## 2022-11-26 NOTE — Discharge Instructions (Addendum)
AFTER SURGERY INSTRUCTIONS   Return to work: 4-6 weeks if applicable   You are going to be discharged home with the foley catheter in place along with the JP drains. Plan on monitoring output from the JP drains several times a day and keep the amounts written on the recording sheet. We will plan for possible removal of the catheter in the office around 1-2 weeks after surgery if the urethra (hole where the urine comes out) is not blocked.  Plan on changing the dressing around the JP drain once daily.  We recommend purchasing several bags of frozen green peas and dividing them into ziploc bags. You will want to keep these in the freezer and have them ready to use as ice packs to the vulvar incision. Once the ice pack is no longer cold, you can get another from the freezer. The frozen peas mold to your body better than a regular ice pack.    Activity: 1. Be up and out of the bed during the day.  Take a nap if needed.  You may walk up steps but be careful and use the hand rail.  Stair climbing will tire you more than you think, you may need to stop part way and rest.    2. No lifting or straining for 6 weeks over 10 pounds. No pushing, pulling, straining for 6 weeks.   3. No driving for around 1 week(s).  Do not drive if you are taking narcotic pain medicine and make sure that your reaction time has returned.    4. You can shower as soon as the next day after surgery. Shower daily.  Use your regular soap and water (not directly on the incision) and pat your incision(s) dry afterwards; don't rub.  No tub baths or submerging your body in water until cleared by your surgeon. If you have the soap that was given to you by pre-surgical testing that was used before surgery, you do not need to use it afterwards because this can irritate your incisions.    5. No sexual activity and nothing in the vagina for 4-6 weeks.   6. You may experience a small amount of clear drainage from your incisions, which is  normal.  If the drainage persists, increases, or changes color please call the office.   7. Do not use creams, lotions, or ointments such as neosporin on your incisions after surgery until advised by your surgeon because they can cause removal of the dermabond glue on your incisions.     8. You may experience vulvar spotting after surgery or when the stitches begin to dissolve.  The spotting is normal but if you experience heavy bleeding, call our office.   9. Take Tylenol first for pain if you are able to take these medication and only use narcotic pain medication for severe pain not relieved by the Tylenol.  Monitor your Tylenol intake to a max of 4,000 mg in a 24 hour period.    Diet: 1. Low sodium Heart Healthy Diet is recommended but you are cleared to resume your normal (before surgery) diet after your procedure.   2. It is safe to use a laxative, such as Miralax or Colace, if you have difficulty moving your bowels. You have been prescribed Sennakot-S to take at bedtime every evening after surgery to keep bowel movements regular and to prevent constipation.     Wound Care: 1. Keep clean and dry.  Shower daily.   Reasons to call the Doctor:  Fever - Oral temperature greater than 100.4 degrees Fahrenheit Foul-smelling vaginal discharge Difficulty urinating Nausea and vomiting Increased pain at the site of the incision that is unrelieved with pain medicine. Difficulty breathing with or without chest pain New calf pain especially if only on one side Sudden, continuing increased vaginal bleeding with or without clots.   Contacts: For questions or concerns you should contact:   Dr. Eugene Garnet at [phone]   [name], NP at [phone]   After Hours: call [phone] and have the GYN Oncologist paged/contacted (after 5 pm or on the weekends). You will speak with an after hours RN and let he or she know you have had surgery.   Messages sent via mychart are for  non-urgent matters and are not responded to after hours so for urgent needs, please call the after hours number.

## 2022-11-26 NOTE — Interval H&P Note (Signed)
History and Physical Interval Note:  11/26/2022 10:04 AM  [name]  has presented today for surgery, with the diagnosis of RECURRENT VULVAR CANCER.  The various methods of treatment have been discussed with the patient and family. After consideration of risks, benefits and other options for treatment, the patient has consented to  Procedure(s): MODIFIED RADICAL VULVECTOMY (N/A) INGUINAL LYMPHADENECTOMY (Bilateral) as a surgical intervention.  The patient's history has been reviewed, patient examined, no change in status, stable for surgery.  I have reviewed the patient's chart and labs.  Questions were answered to the patient's satisfaction.     Carver Fila

## 2022-11-26 NOTE — Brief Op Note (Signed)
11/26/2022  4:01 PM  PATIENT:  [name]  82 y.o. female  PRE-OPERATIVE DIAGNOSIS:  RECURRENT VULVAR CANCER  POST-OPERATIVE DIAGNOSIS:  RECURRENT VULVAR CANCER  PROCEDURE:  Procedure(s): MODIFIED RADICAL VULVECTOMY (N/A) INGUINAL LYMPHADENECTOMY (Bilateral)  SURGEON:  Surgeons and Role:    Carver Fila, MD - Primary  ASSISTANTS: [name] NP   ANESTHESIA:   general  EBL:  200 mL   BLOOD ADMINISTERED:none  DRAINS: bilateral JP drains   LOCAL MEDICATIONS USED:  exparel with 0.25% marcaine  SPECIMEN:  bilateral inguinal lymph nodes, anterior vulva with stitch at 12 o'clock, deep margins from deep to know areas of vulvar cancer  DISPOSITION OF SPECIMEN:  PATHOLOGY  COUNTS:  YES  TOURNIQUET:  * No tourniquets in log *  DICTATION: .Note written in EPIC  PLAN OF CARE: Admit for overnight observation  PATIENT DISPOSITION:  PACU - hemodynamically stable.   Delay start of Pharmacological VTE agent (>24hrs) due to surgical blood loss or risk of bleeding: no

## 2022-11-26 NOTE — Op Note (Signed)
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 Mildly prominent inguinal lymph nodes bilaterally noted during lymph node dissection. Healing areas from recent excisions with diffuse loss of skin architexture anteriorly and extending down bilateral labia (right more inferiorly than left). Skin itself noted to be somewhat thickened. 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 Due to agglutination of inner labia over the urethra noted at recent prior exams in clinic and today, manual lysis of adhesions performed with plan to leave foley catheter in post-op for 1-2 weeks to assure no obstruction of urethral orifice.  Procedure: The patient was identified in the preoperative holding area. Informed consent was signed on the chart. Patient was seen history was reviewed and exam was performed.   The patient was then taken to the operating room and placed in the supine position with SCD hose on. General anesthesia was then induced without difficulty. She was then placed in the dorsolithotomy position. The perineum was prepped with CHG. The vagina was prepped with CHG. The lower abdomen and upper thighs were prepped with CHG. The patient was then draped after the prep was dried. A Foley catheter was inserted into the bladder under sterile conditions.  Timeout was performed the patient,  procedure, antibiotic, allergy, and length of procedure.   Attention was first turned to the inguinal node dissection. The bony landmarks of the pubic tubercle and the ASIS were identified and the palpable location of the femoral artery. An 8-10 cm incision was made 2 fingerbreadth below the inguinal ligament on the right anterior thigh/groin parallel to the inguinal ligament. The camper's fascia was scored and the inferior and superior skin flaps were created with elevation of skin hooks and use of the bovie for dissection.  Despite palpation of the landmarks, the incision on the left ended up being above the inguinal ligament.  This was identified early in the dissection.  Attention was turned inferiorly with a larger area resected from the inferior aspect of the incision.  The inguino-femoral lymph nodes were circumscribed in the operative bed. Lymph node tissue was grasped and using bovie and harmonic dissection, the entire inguinofemoral bed was resected from its attachments to the level of the cribiform facia overlying the femoral vessels. The boundaries of this dissection was the sartorius muscle laterally, the adductor longus tendon medially, the inguinal ligament superiorally, the cribiform fascia posteriorally.  Small blood vessels were controlled with cautery or clips.  The great saphenous vein was identified and skeletonized during this dissection and spared. A stab wound was made with an 11 blade scalpel lateral and inferior to the incision and a 10 french JP drain was placed in the bed of the groin bed. It was secured with nylon. The camper's fascia was closed with running 2-0 vicryl overlying the drain. The superficial subcutaneous tissue was reapproximated with 4-0 vicryl and the skin was closed with running 4-0 monocryl and dermabond.  A duplicate dissection was performed on the left  and drain left.  On the left, the incision was made about 2 cm more inferiorly to be below the level of the  inguinal ligament.  Closure was the same for the left.  The vulvar tissues was then examined. The areas of prior excision as well as tissue that is biopsy proven VIN3 was identified and the marking pen was used to circumscribe the area with appropriate surgical margins. The 15 blade scalpel was used to make an incision through the skin circumferentially as marked. The skin elipse was grasped and was separated from the underlying deep dermal tissues with the bovie device. Somewhat more subcutaneous tissue was removed and at the anterior aspect of the incision, deeper dissection was performed (although not to the endopelvic fasica). Anteriorly the distal clitoris had to be cauterized and transected to completely excision the tissue. After the specimen had been completely resected, it was oriented and marked at 12 o'clock with a 0-vicryl suture. Deeper resection margin was taken from the mid and left anterior aspect of the incision. The bovie was used to obtain hemostasis at the surgical bed. The subcutaneous tissues were irrigated and made hemostatic.   The deep dermal layer was approximated with 2-0vicryl mattress sutures to bring the skin edges into approximation and off tension. The wound was closed following langher's lines. The more superficial subcutaneous tissue was reapproximated with 3-0 vicryl with mattress sutures. The cutaneous layer was closed with interrupted 4-0 vicryl stitches and mattress sutures to ensure a tension free and hemostatic closure. The perineum was again irrigated. The foley was left in place.  All instrument, suture, laparotomy, Ray-Tec, and needle counts were correct x2. The patient tolerated the procedure well and was taken recovery room in stable condition.   Eugene Garnet MD Gynecologic Oncology

## 2022-11-26 NOTE — Transfer of Care (Signed)
Immediate Anesthesia Transfer of Care Note  Patient: [name]  Procedure(s) Performed: Procedure(s): MODIFIED RADICAL VULVECTOMY (N/A) INGUINAL LYMPHADENECTOMY (Bilateral)  Patient Location: PACU  Anesthesia Type:General  Level of Consciousness:  sedated, patient cooperative and responds to stimulation  Airway & Oxygen Therapy:Patient Spontanous Breathing and Patient connected to face mask oxgen  Post-op Assessment:  Report given to PACU RN and Post -op Vital signs reviewed and stable  Post vital signs:  Reviewed and stable  Last Vitals:  Vitals:   11/26/22 1002  BP: (!) 144/76  Pulse: 76  Resp: 15  Temp: 36.8 C  SpO2: 96%    Complications: No apparent anesthesia complications

## 2022-11-27 ENCOUNTER — Encounter: Payer: Self-pay | Admitting: Oncology

## 2022-11-27 ENCOUNTER — Encounter (HOSPITAL_COMMUNITY): Payer: Self-pay | Admitting: Gynecologic Oncology

## 2022-11-27 LAB — CBC
HCT: 37 % (ref 36.0–46.0)
HCT: 35.9 % — ABNORMAL LOW (ref 36.0–46.0)
Hemoglobin: 11.3 g/dL — ABNORMAL LOW (ref 12.0–15.0)
Hemoglobin: 11.2 g/dL — ABNORMAL LOW (ref 12.0–15.0)
MCH: 29.7 pg (ref 26.0–34.0)
MCH: 29.5 pg (ref 26.0–34.0)
MCHC: 30.5 g/dL (ref 30.0–36.0)
MCHC: 31.2 g/dL (ref 30.0–36.0)
MCV: 97.1 fL (ref 80.0–100.0)
MCV: 94.5 fL (ref 80.0–100.0)
Platelets: 101 10*3/uL — ABNORMAL LOW (ref 150–400)
Platelets: 135 10*3/uL — ABNORMAL LOW (ref 150–400)
RBC: 3.81 MIL/uL — ABNORMAL LOW (ref 3.87–5.11)
RBC: 3.8 MIL/uL — ABNORMAL LOW (ref 3.87–5.11)
RDW: 15.8 % — ABNORMAL HIGH (ref 11.5–15.5)
RDW: 15.9 % — ABNORMAL HIGH (ref 11.5–15.5)
WBC: 6.3 10*3/uL (ref 4.0–10.5)
WBC: 7 10*3/uL (ref 4.0–10.5)
nRBC: 0 % (ref 0.0–0.2)
nRBC: 0 % (ref 0.0–0.2)

## 2022-11-27 LAB — BASIC METABOLIC PANEL
Anion gap: 10 (ref 5–15)
BUN: 9 mg/dL (ref 8–23)
CO2: 21 mmol/L — ABNORMAL LOW (ref 22–32)
Calcium: 9 mg/dL (ref 8.9–10.3)
Chloride: 104 mmol/L (ref 98–111)
Creatinine, Ser: 0.67 mg/dL (ref 0.44–1.00)
GFR, Estimated: >60 mL/min (ref >60)
Glucose, Bld: 110 mg/dL — ABNORMAL HIGH (ref 70–99)
Potassium: 3.6 mmol/L (ref 3.5–5.1)
Sodium: 135 mmol/L (ref 135–145)

## 2022-11-27 NOTE — Progress Notes (Signed)
1 Day Post-Op Procedure(s) (LRB): MODIFIED RADICAL VULVECTOMY (N/A) INGUINAL LYMPHADENECTOMY (Bilateral)  Subjective: Patient reports having very post-op little pain. No N/V and tolerating diet well. She has not been out of bed. No flatus. Denies chest pain, dyspnea, lightheadedness. No concerns or needs voiced.  Objective: Vital signs in last 24 hours: Temp:  [97.5 F (36.4 C)-98.2 F (36.8 C)] 97.8 F (36.6 C) (09/20 0547) Pulse Rate:  [48-76] 61 (09/20 0637) Resp:  [5-18] 18 (09/20 0637) BP: (94-144)/(48-78) 96/48 (09/20 0637) SpO2:  [95 %-100 %] 95 % (09/20 0637) Weight:  [163 lb (73.9 kg)] 163 lb (73.9 kg) (09/19 1017) Last BM Date : 11/25/22  Intake/Output from previous day: 09/19 0701 - 09/20 0700 In: 1452.2 [P.O.:240; I.V.:1197.2] Out: 2030 [Urine:1750; Drains:80; Blood:200]  Physical Examination: General: alert, cooperative, and no distress Resp: clear to auscultation bilaterally Cardio: regular rate and rhythm, S1, S2 normal, no murmur, click, rub or gallop GI: soft, non-tender; bowel sounds normal; no masses,  no organomegaly Extremities: extremities normal, atraumatic, no cyanosis or edema Bilateral groin incisions intact with dermabond present, both JP drains stripped. Right JP drain not charged upon assessment. Vulvar incision intact with minimal bleeding noted on peri pad. Foley to gravity with clear, yellow urine present. O2 saat 91-92% on RA, P 56 SCD wraps on with no machine in the room. IS opened and given to patient and encouraged.  Labs: WBC/Hgb/Hct/Plts:  6.3/11.3/37.0/101 (09/20 1610) BUN/Cr/glu/ALT/AST/amyl/lip:  9/0.67/--/--/--/--/-- (09/20 0456)  Assessment: 82 y.o. s/p Procedure(s): MODIFIED RADICAL VULVECTOMY, INGUINAL LYMPHADENECTOMY: stable Pain:  Pain is well-controlled on PRN medications.  Heme: Hgb 11.3 and Hct 37.0 this am. Plan for recheck later this am. Lovenox placed on hold.  ID: WBC 6.3. Given ancef intra-op. No evidence of  infection.  CV: BP and HR stable-mild hypotension. Pt is asymptomatic.   GI:  Tolerating po: Yes. Antiemetics ordered if needed.  GU: Creatinine 0.67 this am. Adequate output reported.    FEN: No critical values on am Bmet.  Prophylaxis: SCDs, lovenox  Plan: Repeat CBC later this am Saline lock IV Plan for discharge home later today with JP x2 in place along with foley RN to educate patient on drain and foley care   LOS: 1 day    Declan Mier D Beecher Furio 11/27/2022, 7:02 AM

## 2022-11-27 NOTE — Progress Notes (Signed)
   11/27/22 1022  TOC Brief Assessment  Insurance and Status Reviewed  Patient has primary care physician Yes  Home environment has been reviewed Lives alone  Prior level of function: Independent at baseline  Prior/Current Home Services No current home services  Social Determinants of Health Reivew SDOH reviewed no interventions necessary  Readmission risk has been reviewed Yes  Transition of care needs no transition of care needs at this time

## 2022-11-27 NOTE — Progress Notes (Signed)
AVS reviewed w/ patient & daughter who verbalized an understanding. Pt stated she had a JP output drainage chart at home. This RN reviewed and demonstrated how to empty JP drains & foley. Pt will keep the current foley drainage bag until she follows up with the provider. PIV removed as documented. Pt is eating lunch and then will d/c home. Pulse Ox removed by this RN - O2 sats 93-96% on RA. Pt will go home w/ her daughter. No other questions at this time.

## 2022-11-27 NOTE — Progress Notes (Signed)
Met with [name] and her family.  They do not have any questions regarding JP drains or foley care.  [name] said she has had both before and is familiar with them.  She knows to keep her JP drains charged and to empty her JP drain q 4 hours or when full and also to empty her foley bag when it is getting full.  Discussed dressing changes daily and to shower daily.  Advised her to call if she has any questions or needs.

## 2022-11-27 NOTE — Progress Notes (Signed)
Mobility Specialist - Progress Note   11/27/22 1053  Mobility  Activity Ambulated with assistance in hallway  Level of Assistance Standby assist, set-up cues, supervision of patient - no hands on  Assistive Device Front wheel walker  Distance Ambulated (ft) 265 ft  Activity Response Tolerated well  Mobility Referral Yes  $Mobility charge 1 Mobility  Mobility Specialist Start Time (ACUTE ONLY) 1033  Mobility Specialist Stop Time (ACUTE ONLY) 1048  Mobility Specialist Time Calculation (min) (ACUTE ONLY) 15 min   Pt received in bed and agreeable to mobility. No complaints during session. Pt to bed after session with all needs met.    [facility]

## 2022-11-27 NOTE — Discharge Summary (Signed)
Physician Discharge Summary  Patient ID: [name] MRN: [id number] DOB/AGE: Jun 21, 1940 82 y.o.  Admit date: 11/26/2022 Discharge date: 11/27/2022  Admission Diagnoses: Vulvar cancer [facility])  Discharge Diagnoses:  Principal Problem:   Vulvar cancer [facility])   Discharged Condition:  The patient is in good condition and stable for discharge.    Hospital Course: On 11/26/2022, the patient underwent the following: Procedure(s): MODIFIED RADICAL VULVECTOMY INGUINAL LYMPHADENECTOMY. The postoperative course was uneventful.  She was discharged to home on postoperative day 1 tolerating a regular diet, ambulating, instructed on foley and JP care, pain minimal.   Consults: None  Significant Diagnostic Studies: Labs  Treatments: surgery: see above  Discharge Exam (am assessment): Blood pressure (!) 96/48, pulse 61, temperature 97.8 F (36.6 C), temperature source Oral, resp. rate 18, height 5\' 5"  (1.651 m), weight 163 lb (73.9 kg), SpO2 95%. General: alert, cooperative, and no distress Resp: clear to auscultation bilaterally Cardio: regular rate and rhythm, S1, S2 normal, no murmur, click, rub or gallop GI: soft, non-tender; bowel sounds normal; no masses,  no organomegaly Extremities: extremities normal, atraumatic, no cyanosis or edema Bilateral groin incisions intact with dermabond present, both JP drains stripped.  Vulvar incision intact with minimal bleeding noted on peri pad. Foley to gravity with clear, yellow urine present. O2 sat 91-92% on RA, P 56. O2 sat up to 95 with deep breathing   Disposition: Discharge disposition: 01-Home or Self Care       Discharge Instructions     Call MD for:  difficulty breathing, headache or visual disturbances   Complete by: As directed    Call MD for:  extreme fatigue   Complete by: As directed    Call MD for:  hives   Complete by: As directed    Call MD for:  persistant dizziness or light-headedness   Complete by: As directed    Call MD  for:  persistant nausea and vomiting   Complete by: As directed    Call MD for:  redness, tenderness, or signs of infection (pain, swelling, redness, odor or green/yellow discharge around incision site)   Complete by: As directed    Call MD for:  severe uncontrolled pain   Complete by: As directed    Call MD for:  temperature >100.4   Complete by: As directed    Diet - low sodium heart healthy   Complete by: As directed    Driving Restrictions   Complete by: As directed    No driving for around 1 week(s).  Do not take narcotics and drive. You need to make sure your reaction time has returned.   Increase activity slowly   Complete by: As directed    Lifting restrictions   Complete by: As directed    No lifting greater than 10 lbs, pushing, pulling, straining for 4-6 weeks.   Sexual Activity Restrictions   Complete by: As directed    No sexual activity, nothing in the vagina, for 6 weeks.      Allergies as of 11/27/2022       Reactions   Prednisone Other (See Comments)   Feels Dizzy   Codeine Anxiety   "Jumpy inside"   Latex Rash        Medication List     STOP taking these medications    Fish Oil 1200 MG Caps   Premarin vaginal cream Generic drug: conjugated estrogens       TAKE these medications    aspirin 81 MG chewable tablet Chew  81 mg by mouth at bedtime.   Carboxymethylcellulose Sod PF 1 % Gel Place 1 application  into both eyes 6 (six) times daily.   hydrochlorothiazide 25 MG tablet Commonly known as: HYDRODIURIL Take 12.5 mg by mouth daily.   levothyroxine 75 MCG tablet Commonly known as: SYNTHROID Take 75 mcg by mouth daily before breakfast. Alternates 50 mcg and 75 mcg   lidocaine 5 % ointment Commonly known as: XYLOCAINE Apply 1 Application topically 3 (three) times daily as needed. Apply to the vulva What changed: reasons to take this   rosuvastatin 5 MG tablet Commonly known as: CRESTOR Take 5 mg by mouth daily after supper.    senna-docusate 8.6-50 MG tablet Commonly known as: Senokot-S Take 2 tablets by mouth at bedtime. For AFTER surgery, do not take if having diarrhea   traMADol 50 MG tablet Commonly known as: ULTRAM Take 1 tablet (50 mg total) by mouth every 6 (six) hours as needed for severe pain. For AFTER surgery, do not take and drive What changed: additional instructions   Vitamin D3 50 MCG (2000 UT) Tabs Take 1 tablet by mouth every evening.        Follow-up Information     Carver Fila, MD Follow up on 12/18/2022.   Specialty: Gynecologic Oncology Why: at 4:15pm at the [facility] information: [address] [place] [address] [phone]         [name] D, NP Follow up on 12/04/2022.   Specialty: Gynecologic Oncology Why: at 3:30pm for a drain check Contact information: [address] [place] [address] [phone]                 Greater than thirty minutes were spend for face to face discharge instructions and discharge orders/summary in EPIC.   Signed: Doylene Bode 11/27/2022, 11:40 AM

## 2022-11-30 ENCOUNTER — Telehealth: Payer: Self-pay | Admitting: *Deleted

## 2022-11-30 NOTE — Telephone Encounter (Signed)
Spoke with [name] this morning. She states she is eating, drinking and urinating well. She has had a BM and is passing gas. She is taking senokot as prescribed and encouraged her to drink plenty of water. She denies fever or chills. Incisions are dry and intact. She rates her pain 1/10. Her pain is controlled with tylenol and she hasn't had any tylenol since Saturday. JP drains are draining well and she is emptying them three times a day, the left is draining more than the right.   Instructed to call office with any fever, chills, purulent drainage, uncontrolled pain or any other questions or concerns. Patient verbalizes understanding.   Pt aware of post op appointments as well as the office number [phone] and after hours number [phone] to call if she has any questions or concerns

## 2022-12-02 LAB — SURGICAL PATHOLOGY

## 2022-12-03 ENCOUNTER — Telehealth: Payer: Self-pay | Admitting: Gynecologic Oncology

## 2022-12-03 NOTE — Telephone Encounter (Signed)
Called patient's daughter regarding recent results.  Vulvar excision shows high-grade dysplasia, inferior margins on both right and left positive for VIN 3.  No invasive carcinoma noted.  Bilateral inguinal lymph nodes negative for metastatic disease.  Plan to review this with the patient in person tomorrow when she comes.  She is very much hoping Foley catheter can be removed.  Eugene Garnet MD Gynecologic Oncology

## 2022-12-04 ENCOUNTER — Inpatient Hospital Stay (HOSPITAL_BASED_OUTPATIENT_CLINIC_OR_DEPARTMENT_OTHER): Payer: Medicare Other | Admitting: Gynecologic Oncology

## 2022-12-04 VITALS — BP 119/69 | HR 83 | Temp 98.2°F | Wt 169.0 lb

## 2022-12-04 DIAGNOSIS — C519 Malignant neoplasm of vulva, unspecified: Secondary | ICD-10-CM

## 2022-12-04 DIAGNOSIS — B3731 Acute candidiasis of vulva and vagina: Secondary | ICD-10-CM

## 2022-12-04 DIAGNOSIS — Z9079 Acquired absence of other genital organ(s): Secondary | ICD-10-CM

## 2022-12-04 MED ORDER — FLUCONAZOLE 100 MG PO TABS
ORAL_TABLET | ORAL | 0 refills | Status: DC
Start: 2022-12-04 — End: 2022-12-23

## 2022-12-04 NOTE — Progress Notes (Unsigned)
Patient reports doing well at home.  Reporting primarily any vulvar discomfort.  Has mild soreness around the JP drains at times.  She has been emptying the JP drains with serosanguineous drainage.  The right drain is draining less than the left.  Her bowels are functioning without difficulty.  No significant vulvar drainage or bleeding reported.  Denies fever or chills.  Tolerating diet without nausea or emesis.  She did have a small skin tear on her abdomen from the tape.  No significant lower extremity edema reported.  She states that she has mild edema in the right lower extremity due to a previous injury.  She has not had to take anything for pain except for Tylenol on a random occasion.  Denies vulvar itching pain.  He does report having mild itching where she has had tape on her abdomen.  Right aspect of incision has opened superficially  GYN Oncology Post-operative Follow Up  12/04/2022  Reason for Visit: follow-up after surgery, vulvar incision/bilateral JP drain/foley assessment   Treatment History: She had a palpable vulvar lump in the winter 2019 and 2020.  It became more noticeable in April 2020 and she attempted to have an appointment with a gynecologist however she could not get an appointment due to the the coronavirus shot downs.  She was eventually able to see a physician on August 25, 2018 at which time evaluation of the vulva confirmed a vulvar mass.  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 De Blanch.    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 09/27/18: Dr Duard Brady performed a radical anterior vulvectomy.  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  The resection margins were negative for carcinoma with the closest being the  lateral margin 1:00 at 0.8 cm.  The carcinoma invaded 1.3 cm depth.  There was no lymphovascular or perineural invasion identified.   Postoperatively a PET/CT was obtained on October 06, 2018.  This revealed postoperative changes to the vulva.  There was no hypermetabolic local regional lymphadenopathy.  There is a questionable subtle focus of hypermetabolism in the left lobe of the liver, indeterminate for liver metastases.  MRI abdomen could be performed for further work-up.  No other potential findings of hypermetabolic distant metastatic disease were seen.   On 10/25/2018, the patient underwent bilateral superficial inguinal lymphadenectomy with fibroadipose tissue noted on the left and no carcinoma seen in 2 of 2 lymph nodes on the right.   The patient was seen in November with vulvar irritation that was intermittent in nature.  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  She was started on triamcinolone twice a day until her symptoms improved and then transition to daily.  I saw her at the end of December and she felt some improvement in her vulvar tenderness but still had significant erythema and irritation of the vulva.  I asked her to add Desitin or similar barrier cream.  When I saw her again in February, she had no improvement and had had significant irritation with the Desitin.  We stopped all topical treatment altogether.   ON 3/25, given persistent skin changes and no improvement in symptoms, vulvar biopsied was performed. Biopsy - high grade dysplasia.   06/15/19: partial simple left vulvectomy, partial simple peri-clitoral  vulvectomy, vulvar biopsies, extensive CO2 laser ablation of the vulva. VIN3 noted in two vulvectomy specimens, positive margins of left specimen. Biopsies showed VIN1.    Treatment with vaginal/vulvar estrogen since surgery.   05/09/2020: Patient taken to the operating room for vulvar biopsies, lysis of vaginal  agglutination, laser ablation of the vulva.  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   We used estrogen intermittently afterwards.   03/11/2021: Given continued vulvar erythema as well as desquamation, patient underwent EUA with vulvar biopsies and CO2 laser of the vulva.  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  Multiple biopsies showed high-grade vulvar dysplasia including vulvar biopsy at 1:00, 11:00, right periurethral, and left periclitoral.   03/25/2021: Given additional biopsies at her first surgery showing high-grade dysplasia that were not treated with laser ablation, additional CO2 laser of the vulva was recommended and performed.  Biopsies taken at the time of surgery showed focal acute inflammation and reactive atypia in 3 of the biopsies.  Only the right vulva at 10:00 showed VIN 2-3.   04/22/22: vulvar biopsies, CO2 laser of the vulva.  Biopsies showed high grade dysplasia, no invasive carcinoma.   10/01/22: Noted to have multiple new vulvar lesions. Biopsy performed on one lesion - pathology with VIN 2/3.   10/23/22: Exam under anesthesia, lysis of vulvar adhesions, multiple vulvar and perianal biopsies, partial simple right vulvectomy x3, vulvar laser ablation  Pathology A. 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 VULVA, RIGHT 11 O'CLOCK, BIOPSY: High-grade squamous intraepithelial lesion, VIN-3 involving the margins of the biopsy C. VULVA, LEFT 2 O'CLOCK, BIOPSY: High-grade squamous intraepithelial lesion, VIN-3 involving the margins of the biopsy D. 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 ANAL, POSTERIOR, BIOPSY: Hyperkeratosis with chronic inflammation and reactive changes Negative for dysplasia   She underwent a PET scan pre-operatively resulting:  IMPRESSION: 1. No definite evidence of local recurrence or definite metastatic disease. 2. Nonspecific mildly hypermetabolic inguinal lymph nodes bilaterally, probably reactive. 3. Nonspecific midline perineal activity without CT correlate, likely urine contamination. 4. Nonspecific activity in the cecum and transverse colon without corresponding abnormality on the CT images, likely physiologic. Correlate with colon cancer screening history. 5.  Aortic Atherosclerosis (ICD10-I70.0).  On 11/26/2022, she underwent a partial modified radical vulvectomy including distal clitorectomy, bilateral inguinal lymphadenectomy by Dr. Pricilla Holm. Her post-operative course was uneventful and she was discharged home on POD 1.

## 2022-12-04 NOTE — Patient Instructions (Addendum)
Today we removed the right JP drain. Keep a dry gauze dressing over the site until this scabs over.   Due to the yeast infection and swelling on the vulva, we are going to plan on leaving the foley catheter in your bladder for another week.  Try letting the vulva open to air and use the peri bottle frequently.   If you notice increase in redness, drainage, feeling poorly please call the office.   Continue to monitor the drainage from the left JP drain.   Plan on starting the tablet diflucan to treat the yeast on your vulva. You will repeat this 3 days after taking the first pill.

## 2022-12-07 ENCOUNTER — Telehealth: Payer: Self-pay | Admitting: *Deleted

## 2022-12-07 DIAGNOSIS — L539 Erythematous condition, unspecified: Secondary | ICD-10-CM

## 2022-12-07 DIAGNOSIS — T81328A Disruption or dehiscence of closure of other specified internal operation (surgical) wound, initial encounter: Secondary | ICD-10-CM

## 2022-12-07 MED ORDER — CEPHALEXIN 250 MG PO CAPS: 250.0000 mg | ORAL_CAPSULE | Freq: Four times a day (QID) | ORAL | 0 refills | Status: DC

## 2022-12-07 NOTE — Telephone Encounter (Signed)
Spoke with Ms. Leonor Liv and relayed message from [name], NP a Rx. For Keflex was sent to pharmacy. Advised patient she may need another treatment for yeast after antibiotics are finished and to call the office and let us know how her symptoms are doing. Pt encouraged to eat yogurt daily for the probiotics to help with yeast. Pt thanked the office for calling and had no further concerns or questions at this time.

## 2022-12-07 NOTE — Telephone Encounter (Addendum)
Returned Ms. Bowden's call. Patient states the left lower abdominal drain site that was circled in our office on Friday, has some redness that is up to the circle at the bottom and a little over the circle on the left side.  Pt states her daughter checks the site and empty's the JP drain three times a day.  Pt states her daughter said, "it looks a little puffy" and it hasn't changed in the last 24 hours.   Pt states the JP drain is emptied three times a day for about 10-10.5 ml each time. The drainage is yellow and a little cloudy. The site that is circled has no drainage.  The site of the right lower abdominal drain that was removed looks good and is healing well.   Pt denies pain, fever and or chills. Pt states her bottom is red, but she is not having a discharge or itching. She took the 1 dose of diflucan on Friday evening and it turned her urine an orange color but that has since cleared.   Patient advised to continue to monitor the JP site and signs & symptoms of infection and the office would call back with any new recommendations and to check on her symptoms. Message would be relayed to providers.

## 2022-12-08 DIAGNOSIS — B3731 Acute candidiasis of vulva and vagina: Secondary | ICD-10-CM | POA: Insufficient documentation

## 2022-12-10 ENCOUNTER — Telehealth: Payer: Self-pay | Admitting: *Deleted

## 2022-12-10 NOTE — Telephone Encounter (Signed)
Spoke with the patient and moved her appt on 10/11 to earlier in the day

## 2022-12-11 ENCOUNTER — Inpatient Hospital Stay: Payer: Medicare Other

## 2022-12-11 ENCOUNTER — Inpatient Hospital Stay: Payer: Medicare Other | Attending: Gynecologic Oncology | Admitting: Gynecologic Oncology

## 2022-12-11 ENCOUNTER — Other Ambulatory Visit: Payer: Self-pay

## 2022-12-11 VITALS — BP 132/73 | HR 67 | Temp 98.5°F | Resp 18 | Ht 62.0 in | Wt 148.2 lb

## 2022-12-11 DIAGNOSIS — C519 Malignant neoplasm of vulva, unspecified: Secondary | ICD-10-CM | POA: Insufficient documentation

## 2022-12-11 DIAGNOSIS — I890 Lymphedema, not elsewhere classified: Secondary | ICD-10-CM | POA: Diagnosis not present

## 2022-12-11 DIAGNOSIS — N390 Urinary tract infection, site not specified: Secondary | ICD-10-CM | POA: Diagnosis not present

## 2022-12-11 DIAGNOSIS — Z9889 Other specified postprocedural states: Secondary | ICD-10-CM

## 2022-12-11 DIAGNOSIS — R300 Dysuria: Secondary | ICD-10-CM

## 2022-12-11 DIAGNOSIS — G8918 Other acute postprocedural pain: Secondary | ICD-10-CM

## 2022-12-11 DIAGNOSIS — Z9079 Acquired absence of other genital organ(s): Secondary | ICD-10-CM | POA: Insufficient documentation

## 2022-12-11 NOTE — Patient Instructions (Signed)
Plan on completing the full course of antibiotics. We will send some fluid from the drain and test for infection. We may need to send you in a different antibiotic.   Due to the swelling, the catheter in your bladder will have to continue to stay in place. We will see you in one week to re-evaluate.   Continue emptying the JP drain and recording the output.   Plan on obtaining compression shorts or underwear to apply pressure to the mons or soft part above your vulva that is swollen. You can also apply ice to this area frequently to help with swelling.   Please call the office for any needs, concerns, or worsening symptoms including fever, redness, pus-like drainage.

## 2022-12-13 LAB — BODY FLUID CULTURE W GRAM STAIN

## 2022-12-17 NOTE — Progress Notes (Signed)
GYN Oncology Post-operative Follow Up  12/11/2022  Reason for Visit: follow-up after surgery, vulvar incision/JP drain/foley assessment   Treatment History: She had a palpable vulvar lump in the winter 2019 and 2020.  It became more noticeable in April 2020 and she attempted to have an appointment with a gynecologist however she could not get an appointment due to the the coronavirus shot downs.  She was eventually able to see a physician on August 25, 2018 at which time evaluation of the vulva confirmed a vulvar mass.  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 De Blanch.    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 09/27/18: Dr Duard Brady performed a radical anterior vulvectomy.  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  The resection margins were negative for carcinoma with the closest being the lateral margin 1:00 at 0.8 cm.  The carcinoma invaded 1.3 cm depth.  There was no lymphovascular or perineural invasion identified.   Postoperatively a PET/CT was obtained on October 06, 2018.  This revealed postoperative changes to the vulva.  There was no hypermetabolic local regional lymphadenopathy.  There is a questionable subtle focus of hypermetabolism in the left lobe of the liver, indeterminate for liver metastases.  MRI abdomen could be performed for further work-up.  No other potential findings of hypermetabolic distant metastatic disease were seen.   On 10/25/2018, the patient underwent bilateral superficial inguinal lymphadenectomy with fibroadipose tissue noted on the left and no carcinoma seen in 2 of 2 lymph nodes on the right.   The patient was seen in November with vulvar irritation that was intermittent in nature.  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  She was started on triamcinolone twice a day until her symptoms improved and then transition to daily.  I saw her at the end of December and she felt some improvement in her vulvar tenderness but still had significant erythema and irritation of the vulva.  I asked her to add Desitin or similar barrier cream.  When I saw her again in February, she had no improvement and had had significant irritation with the Desitin.  We stopped all topical treatment altogether.   ON 3/25, given persistent skin changes and no improvement in symptoms, vulvar biopsied was performed. Biopsy - high grade dysplasia.   06/15/19: partial simple left vulvectomy, partial simple peri-clitoral vulvectomy, vulvar biopsies, extensive CO2 laser ablation of the vulva. VIN3 noted in two vulvectomy specimens, positive margins of left specimen. Biopsies showed VIN1.    Treatment with vaginal/vulvar estrogen since surgery.   05/09/2020: Patient taken to the operating room for vulvar biopsies, lysis of vaginal agglutination, laser ablation of the vulva.  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   We used estrogen intermittently afterwards.   03/11/2021: Given continued vulvar erythema as well as desquamation, patient underwent EUA with vulvar biopsies and CO2 laser of the vulva.  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  Multiple biopsies showed high-grade vulvar dysplasia including vulvar biopsy at 1:00, 11:00, right periurethral, and left periclitoral.   03/25/2021: Given additional biopsies at her first surgery showing high-grade dysplasia that were not treated with laser ablation, additional CO2 laser of the vulva was recommended and performed.  Biopsies taken at the time of surgery showed focal  acute inflammation and reactive atypia in 3 of the biopsies.  Only the right vulva at 10:00 showed VIN 2-3.   04/22/22: vulvar biopsies, CO2 laser of the vulva.  Biopsies showed high  grade dysplasia, no invasive carcinoma.   10/01/22: Noted to have multiple new vulvar lesions. Biopsy performed on one lesion - pathology with VIN 2/3.   10/23/22: Exam under anesthesia, lysis of vulvar adhesions, multiple vulvar and perianal biopsies, partial simple right vulvectomy x3, vulvar laser ablation  Pathology A. 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 VULVA, RIGHT 11 O'CLOCK, BIOPSY: High-grade squamous intraepithelial lesion, VIN-3 involving the margins of the biopsy C. VULVA, LEFT 2 O'CLOCK, BIOPSY: High-grade squamous intraepithelial lesion, VIN-3 involving the margins of the biopsy D. 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 ANAL, POSTERIOR, BIOPSY: Hyperkeratosis with chronic inflammation and reactive changes Negative for dysplasia   She underwent a PET scan  pre-operatively resulting:  IMPRESSION: 1. No definite evidence of local recurrence or definite metastatic disease. 2. Nonspecific mildly hypermetabolic inguinal lymph nodes bilaterally, probably reactive. 3. Nonspecific midline perineal activity without CT correlate, likely urine contamination. 4. Nonspecific activity in the cecum and transverse colon without corresponding abnormality on the CT images, likely physiologic. Correlate with colon cancer screening history. 5.  Aortic Atherosclerosis (ICD10-I70.0).  On 11/26/2022, she underwent a partial modified radical vulvectomy including distal clitorectomy, bilateral inguinal lymphadenectomy by Dr. Pricilla Holm. Her post-operative course was uneventful and she was discharged home on POD 1.  Interval: Patient reports doing well at home.  Reporting minimal to no vulvar discomfort. She has been emptying the left JP drain multiple times a day with serosanguineous drainage that has turned cloudy. She was started on an antibiotic due to reported increase in erythema around the R JP drain before this visit. She has tolerated this well. She feels the redness around the drain is improving. Her bowels are functioning without difficulty.  No significant vulvar drainage or bleeding reported.  Denies fever or chills.  Tolerating diet without nausea or emesis. No significant lower extremity edema reported.  Denies vulvar itching, pain. Her daughter is present today. JP output includes: 11/27/22: 60 cc from left, 16 cc from right 11/28/22: 90 cc from left, 55 cc from right 11/29/22: 55 cc from left, 25 cc from right 11/30/22: 150 cc from left, 15.5 cc from right 12/01/22: 110 cc from left, 15.5 cc from right 12/02/22: 170 cc from left, 14.5 cc from right 12/03/22: 81 cc from left, 14.5 cc from right 12/04/22: 40.5 cc from left, 6 cc from right  12/05/22: 45 cc from the left 12/06/22: 40 cc 12/07/22: 31 cc 12/08/22: 40.5 cc 12/09/22: 41 cc 12/10/22: 40 cc 12/11/22: 30 cc  See  interval. Intake ROS sheet negative.  Exam: Alert, oriented, in no acute distress.  Lungs clear. Heart regular in rate and rhythm  Right inguinal incision with dermabond intact without erythema or drainage. Left JP drain with minimal amount of white cloudy drainage. Mild erythema around left JP insertion site has improved since last visit. No drainage, increased warmth, or fluctuance. More edema noted on the left lower abdomen and inguinal region compared with the right. Mons is more edematous. Left inguinal incision is intact with dermabond without erythema or drainage. Left drain stripped. Previous right JP insertion site is well healed.  On assessment of the vulva with chaperone present, vulva is less erythematous and moist. Moderate mons and vulvar edema. Right aspect of incision along labia has opened superficially. Minimal amount of fibrinous drainage present. Foley catheter is present with clear, yellow urine. Due to edema of the vulva, the urethra is obstructed from view. Foley to remain in place due to edema.  Assessment/Plan: 82 year old s/p partial modified radical vulvectomy including distal clitorectomy, bilateral inguinal lymphadenectomy on 11/26/2022 with Dr. Pricilla Holm presenting to the office today for post-op follow up. Overall, she reports doing well since surgery. Due to vulvar edema, plan for foley to remain in with follow up in one week. Fluid from JP drain sent for culture. Vulvar care discussed including using compression and ice for vulvar/mons edema. The patient and daughter will call the office for any changes. Follow up has been made for one week or sooner if needed. Reportable signs and symptoms reviewed.

## 2022-12-18 ENCOUNTER — Inpatient Hospital Stay: Payer: Medicare Other | Admitting: Gynecologic Oncology

## 2022-12-18 ENCOUNTER — Encounter: Payer: Self-pay | Admitting: Gynecologic Oncology

## 2022-12-18 VITALS — BP 134/74 | HR 84 | Temp 97.9°F | Resp 18 | Wt 164.8 lb

## 2022-12-18 DIAGNOSIS — D071 Carcinoma in situ of vulva: Secondary | ICD-10-CM

## 2022-12-18 DIAGNOSIS — I890 Lymphedema, not elsewhere classified: Secondary | ICD-10-CM

## 2022-12-18 DIAGNOSIS — Z9889 Other specified postprocedural states: Secondary | ICD-10-CM

## 2022-12-18 DIAGNOSIS — C519 Malignant neoplasm of vulva, unspecified: Secondary | ICD-10-CM

## 2022-12-18 NOTE — Progress Notes (Signed)
Gynecologic Oncology Return Clinic Visit  12/18/22  Reason for Visit: follow-up  Treatment History: She had a palpable vulvar lump in the winter 2019 and 2020.  It became more noticeable in April 2020 and she attempted to have an appointment with a gynecologist however she could not get an appointment due to the the coronavirus shot downs.  She was eventually able to see a physician on August 25, 2018 at which time evaluation of the vulva confirmed a vulvar mass.  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 De Blanch.    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 09/27/18: Dr Duard Brady performed a radical anterior vulvectomy.  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  The resection margins were negative for carcinoma with the closest being the lateral margin 1:00 at 0.8 cm.  The carcinoma invaded 1.3 cm depth.  There was no lymphovascular or perineural invasion identified.   Postoperatively a PET/CT was obtained on October 06, 2018.  This revealed postoperative changes to the vulva.  There was no hypermetabolic local regional lymphadenopathy.  There is a questionable subtle focus of hypermetabolism in the left lobe of the liver, indeterminate for liver metastases.  MRI abdomen could be performed for further work-up.  No other potential findings of hypermetabolic distant metastatic disease were seen.   On 10/25/2018, the patient underwent bilateral superficial inguinal lymphadenectomy with fibroadipose tissue noted on the left and no carcinoma seen in 2 of 2 lymph nodes on the right.   The patient was seen in November with vulvar irritation that was intermittent in nature.  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  She was started on triamcinolone twice a day until her symptoms improved and then transition to daily.  I saw her at the end of December and she felt some improvement in her vulvar tenderness but still had significant erythema and irritation of the vulva.  I asked her to add Desitin or similar barrier cream.  When I saw her again in February, she had no improvement and had had significant irritation with the Desitin.  We stopped all topical treatment altogether.   ON 3/25, given persistent skin changes and no improvement in symptoms, vulvar biopsied was performed. Biopsy - high grade dysplasia.   06/15/19: partial simple left vulvectomy, partial simple peri-clitoral vulvectomy, vulvar biopsies, extensive CO2 laser ablation of the vulva. VIN3 noted in two vulvectomy specimens, positive margins of left specimen. Biopsies showed VIN1.    Treatment with vaginal/vulvar estrogen since surgery.   05/09/2020: Patient taken to the operating room for vulvar biopsies, lysis of vaginal agglutination, laser ablation of the vulva.  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   We used estrogen intermittently afterwards.   03/11/2021: Given continued vulvar erythema as well as desquamation, patient underwent EUA with vulvar biopsies and CO2 laser of the vulva.  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  Multiple biopsies showed high-grade vulvar dysplasia including vulvar biopsy at 1:00, 11:00, right periurethral, and left periclitoral.   03/25/2021: Given additional biopsies at her first surgery showing high-grade dysplasia that were not treated with laser ablation, additional CO2 laser of the vulva was recommended and performed.  Biopsies taken at the time of surgery showed focal acute inflammation and reactive atypia in 3  of the biopsies.  Only the right vulva at 10:00 showed VIN 2-3.   04/22/22: vulvar biopsies, CO2 laser of the vulva.  Biopsies showed high grade dysplasia, no invasive carcinoma.   10/01/22:  Noted to have multiple new vulvar lesions. Biopsy performed on one lesion - pathology with VIN 2/3.   10/23/22: Exam under anesthesia, lysis of vulvar adhesions, multiple vulvar and perianal biopsies, partial simple right vulvectomy x3, vulvar laser ablation.  Multiple biopsy showed VIN 2/3.  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  No lymphovascular involvement was noted of any of these 3 biopsy sites.  11/19/2022: PET scan without evidence of local or metastatic recurrence.  Nonspecific mildly hypermetabolic inguinal nodes bilaterally thought to be likely reactive.  Nonspecific midline perineal activity without CT correlate.  11/26/22: Partial modified radical vulvectomy including distal clitorectomy, bilateral inguinal lymphadenectom   Interval History: Overall doing well.  Did not tolerate having compression shorts on.  Left groin drain has been putting out about 5 cc of clear fluid, nothing purulent.  Using ice multiple times a day with overall less swelling per her daughter's report.  Endorses normal bowel function.  Past Medical/Surgical History: Past Medical History:  Diagnosis Date   Celiac artery aneurysm (HCC) 2012   followed by vascular-- dr Myra Gianotti;  Theron Arista in epic 04-13-2022  stable no sig change since last visit   Complication of anesthesia    slow to wake up /  body temperature goes down   warm blankets with 1st surgery only   History of asthma    04-14-2022 per pt last had a rescue inhaler many yrs ago   History of cervical cancer 2008   s/p  TAH w/ BSO w/ pelvic lymphadenectomy for SCC of cervix   History of vaginal dysplasia    History of vulvar dysplasia 2020   with hx recurrence  s/p  bx's laser ablation and vulvectomy   Hyperlipidemia    Hypertension    Hypothyroidism    followed by pcp   Lichen sclerosus of vulva    Pneumonia    PONV (postoperative nausea and vomiting)    NEEDS NAUSEA MED BEFORE SURGERY    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HCC)    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HCC)    Status post gamma knife treatment 03/2013   RIGHT V2  @ [facility] for trigeminal neuralgia    Past Surgical History:  Procedure Laterality Date   BREAST SURGERY Left    1970s  left breast cyst in areolar area   CATARACT EXTRACTION W/ INTRAOCULAR LENS IMPLANT Bilateral 2017   CO2 LASER APPLICATION N/A 06/15/2019   Procedure: C02 LASER OF VULVAR, VULVAR BIOPSIES,  SIMPLE VULVECTOMY;  Surgeon: Carver Fila, MD;  Location: [facility];  Service: Gynecology;  Laterality: N/A;   CO2 LASER APPLICATION N/A 05/09/2020   Procedure: VULVAR  LASER APPLICATION;  Surgeon: Carver Fila, MD;  Location: [facility];  Service: Gynecology;  Laterality: N/A;   CO2 LASER APPLICATION N/A 03/11/2021   Procedure: CO2 LASER APPLICATION TO VULVA;  Surgeon: Carver Fila, MD;  Location: [facility];  Service: Gynecology;  Laterality: N/A;   CO2 LASER APPLICATION N/A 03/25/2021   Procedure: CO2 LASER APPLICATION TO VULVA;  Surgeon: Carver Fila, MD;  Location: [facility];  Service: Gynecology;  Laterality: N/A;   CO2 LASER APPLICATION N/A 04/22/2022   Procedure: CO2 LASER ABLATION TO VULVA;  Surgeon:  Carver Fila, MD;  Location: [facility];  Service: Gynecology;  Laterality: N/A;   CO2 LASER APPLICATION N/A 10/23/2022   Procedure: CO2 LASER OF VULVA;  Surgeon: Carver Fila, MD;  Location: [facility];  Service: Gynecology;  Laterality: N/A;   INGUINAL LYMPHADENECTOMY Bilateral 11/26/2022   Procedure: INGUINAL LYMPHADENECTOMY;  Surgeon: Carver Fila, MD;  Location: [facility];  Service: Gynecology;  Laterality: Bilateral;   LYMPH NODE DISSECTION Bilateral 10/25/2018   Procedure: LYMPH NODE DISSECTION,BILATERAL INGUINOFEMORAL LYMPHADECTOMY;  Surgeon: Cleda Mccreedy, MD;  Location: [facility];  Service: Gynecology;  Laterality:  Bilateral;   RADICAL VULVECTOMY N/A 09/27/2018   Procedure: RADICAL VULVECTOMY;  Surgeon: Cleda Mccreedy, MD;  Location: [facility];  Service: Gynecology;  Laterality: N/A;   RADICAL VULVECTOMY N/A 11/26/2022   Procedure: MODIFIED RADICAL VULVECTOMY;  Surgeon: Carver Fila, MD;  Location: [facility];  Service: Gynecology;  Laterality: N/A;   RETROMASTOID CRANIOTOMY Right 08/08/2020   @ [facility];   MCV DECOMPRESSION TRIGEMINAL NERVE   TOTAL ABDOMINAL HYSTERECTOMY W/ BILATERAL SALPINGOOPHORECTOMY  04/27/2006   @ [facility] by dr Loree Fee;   and Pelvic lymphadenectomy / insertion suprapubic cath   VULVA /PERINEUM BIOPSY N/A 05/09/2020   Procedure: VULVAR BIOPSY;  Surgeon: Carver Fila, MD;  Location: [facility];  Service: Gynecology;  Laterality: N/A;   VULVA Ples Specter BIOPSY N/A 03/11/2021   Procedure: VULVAR BIOPSY;  Surgeon: Carver Fila, MD;  Location: [facility];  Service: Gynecology;  Laterality: N/A;   VULVA Ples Specter BIOPSY N/A 03/25/2021   Procedure: VULVAR BIOPSY;  Surgeon: Carver Fila, MD;  Location: [facility];  Service: Gynecology;  Laterality: N/A;   VULVA Ples Specter BIOPSY N/A 04/22/2022   Procedure: VULVAR BIOPSY;  Surgeon: Carver Fila, MD;  Location: [facility];  Service: Gynecology;  Laterality: N/A;   VULVECTOMY N/A 10/23/2022   Procedure: WIDE EXCISION VULVECTOMY;  Surgeon: Carver Fila, MD;  Location: [facility];  Service: Gynecology;  Laterality: N/A;    Family History  Problem Relation Age of Onset   Skin cancer Father     Social History   Socioeconomic History   Marital status: Widowed    Spouse name: Not on file   Number of children: Not on file   Years of education: Not on file   Highest education level: Not on file  Occupational History   Not on file  Tobacco Use   Smoking status: Former    Current packs/day: 0.00    Types: Cigarettes    Quit date:  12/08/2003    Years since quitting: 19.0    Passive exposure: Current   Smokeless tobacco: Never   Tobacco comments:    social  Vaping Use   Vaping status: Never Used  Substance and Sexual Activity   Alcohol use: No   Drug use: No   Sexual activity: Not Currently  Other Topics Concern   Not on file  Social History Narrative   Not on file   Social Determinants of Health   Financial Resource Strain: Not on file  Food Insecurity: No Food Insecurity (11/26/2022)   Hunger Vital Sign    Worried About Running Out of Food in the Last Year: Never true    Ran Out of Food in the Last Year: Never true  Transportation Needs: No Transportation Needs (11/26/2022)   PRAPARE - Transportation    Lack of Transportation (Medical): No    Lack  of Transportation (Non-Medical): No  Physical Activity: Not on file  Stress: Not on file  Social Connections: Not on file    Current Medications:  Current Outpatient Medications:    aspirin 81 MG chewable tablet, Chew 81 mg by mouth at bedtime., Disp: , Rfl:    Carboxymethylcellulose Sod PF 1 % GEL, Place 1 application  into both eyes 6 (six) times daily., Disp: , Rfl:    cephALEXin (KEFLEX) 250 MG capsule, Take 1 capsule (250 mg total) by mouth 4 (four) times daily., Disp: 28 capsule, Rfl: 0   Cholecalciferol (VITAMIN D3) 50 MCG (2000 UT) TABS, Take 1 tablet by mouth every evening., Disp: , Rfl:    fluconazole (DIFLUCAN) 100 MG tablet, Take one tablet by mouth once. Repeat in three days., Disp: 2 tablet, Rfl: 0   hydrochlorothiazide (HYDRODIURIL) 25 MG tablet, Take 12.5 mg by mouth daily., Disp: , Rfl:    levothyroxine (SYNTHROID) 75 MCG tablet, Take 75 mcg by mouth daily before breakfast. Alternates 50 mcg and 75 mcg, Disp: , Rfl:    lidocaine (XYLOCAINE) 5 % ointment, Apply 1 Application topically 3 (three) times daily as needed. Apply to the vulva (Patient taking differently: Apply 1 Application topically 3 (three) times daily as needed for mild pain.  Apply to the vulva), Disp: 35.44 g, Rfl: 1   rosuvastatin (CRESTOR) 5 MG tablet, Take 5 mg by mouth daily after supper., Disp: , Rfl:    senna-docusate (SENOKOT-S) 8.6-50 MG tablet, Take 2 tablets by mouth at bedtime. For AFTER surgery, do not take if having diarrhea, Disp: 30 tablet, Rfl: 0   traMADol (ULTRAM) 50 MG tablet, Take 1 tablet (50 mg total) by mouth every 6 (six) hours as needed for severe pain. For AFTER surgery, do not take and drive (Patient taking differently: Take 50 mg by mouth every 6 (six) hours as needed for severe pain. For AFTER surgery), Disp: 10 tablet, Rfl: 0  Review of Systems: Denies appetite changes, fevers, chills, fatigue, unexplained weight changes. Denies hearing loss, neck lumps or masses, mouth sores, ringing in ears or voice changes. Denies cough or wheezing.  Denies shortness of breath. Denies chest pain or palpitations. Denies leg swelling. Denies abdominal distention, pain, blood in stools, constipation, diarrhea, nausea, vomiting, or early satiety. Denies pain with intercourse, dysuria, frequency, hematuria or incontinence. Denies hot flashes, pelvic pain, vaginal bleeding or vaginal discharge.   Denies joint pain, back pain or muscle pain/cramps. Denies itching, rash, or wounds. Denies dizziness, headaches, numbness or seizures. Denies swollen lymph nodes or glands, denies easy bruising or bleeding. Denies anxiety, depression, confusion, or decreased concentration.  Physical Exam: BP 134/74 (BP Location: Left Arm, Patient Position: Sitting)   Pulse 84   Temp 97.9 F (36.6 C) (Oral)   Resp 18   Wt 164 lb 12.8 oz (74.8 kg)   SpO2 99%   BMI 30.14 kg/m  General: Alert, oriented, no acute distress. HEENT: Normocephalic, atraumatic, sclera anicteric. Chest: Unlabored breathing on room air. Abdomen: soft, nontender.  Normoactive bowel sounds.  No masses or hepatosplenomegaly appreciated.  Well-healed groin incisions.  No erythema surrounding the  incision some edema extending along the left incision with much improved edema of the mons and only very mild blanching erythema.  This is significantly improved from a week ago.  Minimal yellow-tinged fluid within the groin drain. Extremities: Grossly normal range of motion.  Warm, well perfused.  Trace edema bilaterally lower leg, some swelling in the upper thigh. GU: Improved edema of  the mons and bilateral labia.  No significant erythema or exudate around the incision.  Incision is appropriately tender to palpation.  Inferior aspect of the right vulvar incision has opened, healing well.  Laboratory & Radiologic Studies: None new  Assessment & Plan: [name] is a 82 y.o. 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 No residual malignancy noted.  VIN 3 with positive inferior margins from the vulvar specimen bilaterally.  Lymph nodes negative.  Patient is doing very well from a postoperative standpoint.  Given decreased drain output, left groin drain will be pulled today.  Discussed continuing ice therapy for swelling.  She has had significant improvement of her edema and there is no real erythema on exam today.  No signs of infection.  I discussed my concern that if we remove the Foley catheter today, she may agglutinated the tissue over the area of the urethra.  I would like to wait another week and a half for 2 to allow the right aspect of her vulvar incision to heal a little bit more before we take out the Foley catheter.  We reviewed pathology from surgery again which shows no residual disease.  At some point over the next few months, given margins positive for VIN 3 at the inferior aspect of her bilateral vulvar excision, may return to the OR for small bilateral excisions versus laser ablation.  Discussed risks and benefits of consolidative treatment with external  beam radiation therapy.  Ultimately, I think that radiation puts her at high risk of struggling even more with long-term lymphedema.  20 minutes of total time was spent for this patient encounter, including preparation, face-to-face counseling with the patient and coordination of care, and documentation of the encounter.  Eugene Garnet, MD  Division of Gynecologic Oncology  Department of Obstetrics and Gynecology  [facility]

## 2022-12-28 ENCOUNTER — Other Ambulatory Visit: Payer: Self-pay

## 2022-12-28 ENCOUNTER — Inpatient Hospital Stay (HOSPITAL_BASED_OUTPATIENT_CLINIC_OR_DEPARTMENT_OTHER): Payer: Medicare Other | Admitting: Gynecologic Oncology

## 2022-12-28 ENCOUNTER — Telehealth: Payer: Self-pay | Admitting: *Deleted

## 2022-12-28 VITALS — BP 129/72 | HR 66 | Temp 98.2°F | Resp 20 | Wt 164.6 lb

## 2022-12-28 DIAGNOSIS — Z978 Presence of other specified devices: Secondary | ICD-10-CM

## 2022-12-28 DIAGNOSIS — C519 Malignant neoplasm of vulva, unspecified: Secondary | ICD-10-CM

## 2022-12-28 NOTE — Telephone Encounter (Signed)
Spoke with Ms. Obi who called the office concerned that she wasn't putting the Vaseline in the right place on her perineal area that [name], NP advised. Pt is voiding well, but is worried that her labial folds don't open enough to place the Vaseline in the center on urethra. Pt advised that the Vaseline doesn't need to be placed directly on the urethra just around the area on labial and vulva. Pt verbalized understanding and advised to call the office with any concerns or questions.

## 2022-12-28 NOTE — Progress Notes (Unsigned)
GYN Oncology Post-operative Follow Up  12/28/2022  Reason for Visit: vulvar assessment, possible foley catheter removal   Treatment History: She had a palpable vulvar lump in the winter 2019 and 2020.  It became more noticeable in April 2020 and she attempted to have an appointment with a gynecologist however she could not get an appointment due to the the coronavirus shot downs.  She was eventually able to see a physician on August 25, 2018 at which time evaluation of the vulva confirmed a vulvar mass.  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 De Blanch.    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 09/27/18: Dr Duard Brady performed a radical anterior vulvectomy.  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  The resection margins were negative for carcinoma with the closest being the lateral margin 1:00 at 0.8 cm.  The carcinoma invaded 1.3 cm depth.  There was no lymphovascular or perineural invasion identified.   Postoperatively a PET/CT was obtained on October 06, 2018.  This revealed postoperative changes to the vulva.  There was no hypermetabolic local regional lymphadenopathy.  There is a questionable subtle focus of hypermetabolism in the left lobe of the liver, indeterminate for liver metastases.  MRI abdomen could be performed for further work-up.  No other potential findings of hypermetabolic distant metastatic disease were seen.   On 10/25/2018, the patient underwent bilateral superficial inguinal lymphadenectomy with fibroadipose tissue noted on the left and no carcinoma seen in 2 of 2 lymph nodes on the right.   The patient was seen in November with vulvar irritation that was intermittent in nature.  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  She was started on triamcinolone twice a day until her symptoms improved and then transition to daily.  I saw her at the end of December and she felt some improvement in her vulvar tenderness but still had significant erythema and irritation of the vulva.  I asked her to add Desitin or similar barrier cream.  When I saw her again in February, she had no improvement and had had significant irritation with the Desitin.  We stopped all topical treatment altogether.   ON 3/25, given persistent skin changes and no improvement in symptoms, vulvar biopsied was performed. Biopsy - high grade dysplasia.   06/15/19: partial simple left vulvectomy, partial simple peri-clitoral vulvectomy, vulvar biopsies, extensive CO2 laser ablation of the vulva. VIN3 noted in two vulvectomy specimens, positive margins of left specimen. Biopsies showed VIN1.    Treatment with vaginal/vulvar estrogen since surgery.   05/09/2020: Patient taken to the operating room for vulvar biopsies, lysis of vaginal agglutination, laser ablation of the vulva.  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   We used estrogen intermittently afterwards.   03/11/2021: Given continued vulvar erythema as well as desquamation, patient underwent EUA with vulvar biopsies and CO2 laser of the vulva.  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  Multiple biopsies showed high-grade vulvar dysplasia including vulvar biopsy at 1:00, 11:00, right periurethral, and left periclitoral.   03/25/2021: Given additional biopsies at her first surgery showing high-grade dysplasia that were not treated with laser ablation, additional CO2 laser of the vulva was recommended and performed.  Biopsies taken at the time of surgery showed focal acute  inflammation and reactive atypia in 3 of the biopsies.  Only the right vulva at 10:00 showed VIN 2-3.   04/22/22: vulvar biopsies, CO2 laser of the vulva.  Biopsies showed high grade  dysplasia, no invasive carcinoma.   10/01/22: Noted to have multiple new vulvar lesions. Biopsy performed on one lesion - pathology with VIN 2/3.   10/23/22: Exam under anesthesia, lysis of vulvar adhesions, multiple vulvar and perianal biopsies, partial simple right vulvectomy x3, vulvar laser ablation  Pathology A. 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 VULVA, RIGHT 11 O'CLOCK, BIOPSY: High-grade squamous intraepithelial lesion, VIN-3 involving the margins of the biopsy C. VULVA, LEFT 2 O'CLOCK, BIOPSY: High-grade squamous intraepithelial lesion, VIN-3 involving the margins of the biopsy D. 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 ANAL, POSTERIOR, BIOPSY: Hyperkeratosis with chronic inflammation and reactive changes Negative for dysplasia   She underwent a PET scan  pre-operatively resulting:  IMPRESSION: 1. No definite evidence of local recurrence or definite metastatic disease. 2. Nonspecific mildly hypermetabolic inguinal lymph nodes bilaterally, probably reactive. 3. Nonspecific midline perineal activity without CT correlate, likely urine contamination. 4. Nonspecific activity in the cecum and transverse colon without corresponding abnormality on the CT images, likely physiologic. Correlate with colon cancer screening history. 5.  Aortic Atherosclerosis (ICD10-I70.0).  On 11/26/2022, she underwent a partial modified radical vulvectomy including distal clitorectomy, bilateral inguinal lymphadenectomy by Dr. Pricilla Holm. Her post-operative course was uneventful and she was discharged home on POD 1.  Interval: Patient reports doing well at home.  Reporting no vulvar discomfort but reports having an odor. She reports having a sticky like drainage around the catheter and on her inner thighs at times. The foley catheter has been draining. No back pain. Her bowels are functioning without difficulty.  No significant vulvar drainage or bleeding reported.  Denies fever or chills.  Tolerating diet without nausea or emesis. No significant lower extremity edema reported.  She has been using ice frequently to the mons and vulva and feels this has helped with the swelling. Her daughter is present today.  See interval. No new symptoms reported on ROS  Exam: Alert, oriented, in no acute distress.  Breathing unlabored.  Abdomen soft. Bilateral groin incisions healing well along with previous JP drain sites.  On assessment of the vulva with chaperone present, improved edema of the mons and bilateral labia.  No significant erythema or exudate around the incision.  Incision is appropriately tender to palpation.  Inferior aspect of the right vulvar incision has opened, healing well. There is a mild odor. Foley  to straight drain with clear yellow urine in the bag.  The urethra is not  obstructed. At 1:20 pm, 170 cc of NS instilled in the bladder. At 1:35 pm, pt able to void freely 150 cc.   Assessment/Plan: 82 year old s/p partial modified radical vulvectomy including distal clitorectomy, bilateral inguinal lymphadenectomy on 11/26/2022 with Dr. Pricilla Holm presenting to the office today for continued post-op follow up, foley removal. Due to urethra being non-obstructed with improvement in vulvar edema, the foley was removed without difficulty. The bladder was backfilled before removal and patient was able to void without difficulty. Vulvar care discussed including use of vaseline and intermittent ice for vulvar/mons edema. The patient and daughter will call the office for any changes. Follow up has been made for two weeks or sooner if needed. Reportable signs and symptoms reviewed.

## 2022-12-28 NOTE — Patient Instructions (Addendum)
Please call the office if you develop difficulty passing your urine. You can plan on rubbing vaseline cream in between the labia to help prevent the skin from sticking together.   We will plan on seeing you back in the office in two weeks or sooner if needed. Please call the office for any new symptoms. Continue rinsing the vulva after toileting and pat dry. You can use a sitz bath as well.

## 2022-12-30 ENCOUNTER — Telehealth: Payer: Self-pay | Admitting: *Deleted

## 2022-12-30 NOTE — Telephone Encounter (Signed)
Spoke with [name] who states she is doing well and having no difficulties urinating and no change in stream. Advised patient to call the office with any concerns or questions, reminded her of appt. With [name], NP on Friday, November 8th at 3 pm. Pt verbalized understanding.

## 2022-12-31 ENCOUNTER — Ambulatory Visit: Payer: Medicare Other | Admitting: Gynecologic Oncology

## 2022-12-31 ENCOUNTER — Telehealth: Payer: Self-pay | Admitting: *Deleted

## 2022-12-31 ENCOUNTER — Other Ambulatory Visit: Payer: Self-pay | Admitting: Gynecologic Oncology

## 2022-12-31 DIAGNOSIS — R300 Dysuria: Secondary | ICD-10-CM

## 2022-12-31 MED ORDER — NITROFURANTOIN MONOHYD MACRO 100 MG PO CAPS
100.0000 mg | ORAL_CAPSULE | Freq: Two times a day (BID) | ORAL | 0 refills | Status: DC
Start: 2022-12-31 — End: 2023-03-31

## 2022-12-31 NOTE — Progress Notes (Signed)
See RN note.

## 2022-12-31 NOTE — Telephone Encounter (Addendum)
Spoke with Ms. Jollie who called the office stating she is having some urinary frequency, denies fever and or chills, and denies pain. Pt states her urine is cloudy and she has occasional burning on urination. Advised patient if she can come to the clinic this afternoon so we can collect a urine specimen. Pt is unable to make it to the clinic until tomorrow morning.   Pt advised that message was sent to providers and a Rx for antibiotic would be sent to her pharmacy and to take a dose this evening if her symptoms worsen, if not she can take the first dose after the urine sample tomorrow morning, but either way she needs to come to the clinic in the  morning. Pt verbalized understanding and will have her daughter bring her to the clinic in the morning.

## 2023-01-01 ENCOUNTER — Inpatient Hospital Stay: Payer: Medicare Other

## 2023-01-01 ENCOUNTER — Telehealth: Payer: Self-pay | Admitting: *Deleted

## 2023-01-01 DIAGNOSIS — C519 Malignant neoplasm of vulva, unspecified: Secondary | ICD-10-CM | POA: Diagnosis not present

## 2023-01-01 DIAGNOSIS — R300 Dysuria: Secondary | ICD-10-CM

## 2023-01-01 DIAGNOSIS — Z9889 Other specified postprocedural states: Secondary | ICD-10-CM | POA: Diagnosis not present

## 2023-01-01 DIAGNOSIS — I890 Lymphedema, not elsewhere classified: Secondary | ICD-10-CM | POA: Diagnosis not present

## 2023-01-01 DIAGNOSIS — N390 Urinary tract infection, site not specified: Secondary | ICD-10-CM | POA: Diagnosis not present

## 2023-01-01 LAB — URINALYSIS, COMPLETE (UACMP) WITH MICROSCOPIC
Bilirubin Urine: NEGATIVE
Glucose, UA: NEGATIVE mg/dL
Ketones, ur: NEGATIVE mg/dL
Nitrite: NEGATIVE
Protein, ur: 30 mg/dL — AB
Specific Gravity, Urine: 1.016 (ref 1.005–1.030)
WBC, UA: >50 WBC/hpf (ref 0–5)
pH: 5 (ref 5.0–8.0)

## 2023-01-01 NOTE — Telephone Encounter (Signed)
I spoke with [name] regarding her UA results. Her culture is pending and antibiotics have been sent to her pharmacy. She plans to pick up the prescription today and start taking the antibiotics. She will call the office with worsening symptoms, question or concerns.

## 2023-01-03 LAB — URINE CULTURE: Culture: >=100000 — AB

## 2023-01-04 ENCOUNTER — Telehealth: Payer: Self-pay | Admitting: *Deleted

## 2023-01-04 NOTE — Telephone Encounter (Signed)
-----   Message from Doylene Bode sent at 01/04/2023 11:10 AM EDT ----- Please give her a call and see how her urinary symptoms are. Her culture has returned and the abx prescribed should treat the infection. She needs to keep [place] posted if when she finishes she still has symptoms. Thanks ----- Message ----- From: Leory Plowman, Lab In [place] Sent: 01/01/2023  10:36 AM EDT To: Doylene Bode, NP

## 2023-01-04 NOTE — Telephone Encounter (Signed)
Spoke with Ms. Brodt and relayed message from [name], NP that patient's urine culture has resulted and shows infection. The antibiotics that were prescribed should treat the infection.  Patient states her symptoms have improved and her urine is no longer cloudy. Pt advised to continue to take antibiotics and keep the office posted if when Rx is finished and you start to have symptoms. Pt verbalized understanding, and has no further concerns at this time.

## 2023-01-13 DIAGNOSIS — E039 Hypothyroidism, unspecified: Secondary | ICD-10-CM | POA: Diagnosis not present

## 2023-01-13 DIAGNOSIS — E782 Mixed hyperlipidemia: Secondary | ICD-10-CM | POA: Diagnosis not present

## 2023-01-13 DIAGNOSIS — R7303 Prediabetes: Secondary | ICD-10-CM | POA: Diagnosis not present

## 2023-01-13 DIAGNOSIS — I10 Essential (primary) hypertension: Secondary | ICD-10-CM | POA: Diagnosis not present

## 2023-01-13 DIAGNOSIS — E559 Vitamin D deficiency, unspecified: Secondary | ICD-10-CM | POA: Diagnosis not present

## 2023-01-15 ENCOUNTER — Inpatient Hospital Stay: Payer: Medicare Other

## 2023-01-15 ENCOUNTER — Other Ambulatory Visit: Payer: Self-pay

## 2023-01-15 ENCOUNTER — Inpatient Hospital Stay: Payer: Medicare Other | Attending: Gynecologic Oncology | Admitting: Gynecologic Oncology

## 2023-01-15 ENCOUNTER — Other Ambulatory Visit: Payer: Self-pay | Admitting: Oncology

## 2023-01-15 ENCOUNTER — Telehealth: Payer: Self-pay | Admitting: *Deleted

## 2023-01-15 VITALS — BP 126/72 | HR 67 | Temp 98.0°F | Resp 20 | Wt 164.8 lb

## 2023-01-15 DIAGNOSIS — N390 Urinary tract infection, site not specified: Secondary | ICD-10-CM | POA: Diagnosis not present

## 2023-01-15 DIAGNOSIS — R300 Dysuria: Secondary | ICD-10-CM

## 2023-01-15 DIAGNOSIS — Z9889 Other specified postprocedural states: Secondary | ICD-10-CM | POA: Insufficient documentation

## 2023-01-15 DIAGNOSIS — Z9079 Acquired absence of other genital organ(s): Secondary | ICD-10-CM | POA: Insufficient documentation

## 2023-01-15 DIAGNOSIS — I890 Lymphedema, not elsewhere classified: Secondary | ICD-10-CM | POA: Insufficient documentation

## 2023-01-15 DIAGNOSIS — C519 Malignant neoplasm of vulva, unspecified: Secondary | ICD-10-CM | POA: Insufficient documentation

## 2023-01-15 DIAGNOSIS — Q525 Fusion of labia: Secondary | ICD-10-CM

## 2023-01-15 LAB — URINALYSIS, COMPLETE (UACMP) WITH MICROSCOPIC
Bilirubin Urine: NEGATIVE
Glucose, UA: NEGATIVE mg/dL
Ketones, ur: NEGATIVE mg/dL
Nitrite: NEGATIVE
Protein, ur: NEGATIVE mg/dL
Specific Gravity, Urine: 1.014 (ref 1.005–1.030)
pH: 5 (ref 5.0–8.0)

## 2023-01-15 MED ORDER — ESTROGENS CONJUGATED 0.625 MG/GM VA CREA: TOPICAL_CREAM | VAGINAL | 6 refills | Status: DC

## 2023-01-15 NOTE — Progress Notes (Unsigned)
Had cloudy urine, none today. Had one episode of burning. No fever chills. No vulvar itching, drainage, bleeding, pain  GYN Oncology Post-operative Follow Up  12/28/2022  Reason for Visit: vulvar assessment, possible foley catheter removal   Treatment History: She had a palpable vulvar lump in the winter 2019 and 2020.  It became more noticeable in April 2020 and she attempted to have an appointment with a gynecologist however she could not get an appointment due to the the coronavirus shot downs.  She was eventually able to see a physician on August 25, 2018 at which time evaluation of the vulva confirmed a vulvar mass.  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 De Blanch.    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 09/27/18: Dr Duard Brady performed a radical anterior vulvectomy.  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  The resection margins were negative for carcinoma with the closest being the lateral margin 1:00 at 0.8 cm.  The carcinoma invaded 1.3 cm depth.  There was no lymphovascular or perineural invasion identified.   Postoperatively a PET/CT was obtained on October 06, 2018.  This revealed postoperative changes to the vulva.  There was no hypermetabolic local regional lymphadenopathy.  There is a questionable subtle focus of hypermetabolism in the left lobe of the liver, indeterminate for liver metastases.  MRI abdomen could be performed for further work-up.  No other potential findings of hypermetabolic distant metastatic disease were seen.   On 10/25/2018, the patient underwent bilateral superficial inguinal lymphadenectomy with fibroadipose tissue noted on the left and no carcinoma seen in 2 of 2 lymph nodes on the right.   The patient  was seen in November with vulvar irritation that was intermittent in nature.  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  She was started on triamcinolone twice a day until her symptoms improved and then transition to daily.  I saw her at the end of December and she felt some improvement in her vulvar tenderness but still had significant erythema and irritation of the vulva.  I asked her to add Desitin or similar barrier cream.  When I saw her again in February, she had no improvement and had had significant irritation with the Desitin.  We stopped all topical treatment altogether.   ON 3/25, given persistent skin changes and no improvement in symptoms, vulvar biopsied was performed. Biopsy - high grade dysplasia.   06/15/19: partial simple left vulvectomy, partial simple peri-clitoral vulvectomy, vulvar biopsies, extensive CO2 laser ablation of the vulva. VIN3 noted in two vulvectomy specimens, positive margins of left specimen. Biopsies showed VIN1.    Treatment with vaginal/vulvar estrogen since surgery.   05/09/2020: Patient taken to the operating room for vulvar biopsies, lysis of vaginal agglutination, laser ablation of the vulva.  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   We used estrogen intermittently afterwards.   03/11/2021: Given continued vulvar erythema as well as desquamation, patient underwent EUA with vulvar biopsies and CO2 laser of the vulva.  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  Multiple biopsies showed high-grade vulvar dysplasia including vulvar biopsy at 1:00, 11:00, right periurethral, and left periclitoral.   03/25/2021: Given additional biopsies at her first surgery showing high-grade dysplasia that were not treated with laser ablation, additional  CO2 laser of the vulva was recommended and performed.  Biopsies taken at the time of surgery showed focal acute inflammation and reactive atypia in 3 of the biopsies.  Only the  right vulva at 10:00 showed VIN 2-3.   04/22/22: vulvar biopsies, CO2 laser of the vulva.  Biopsies showed high grade dysplasia, no invasive carcinoma.   10/01/22: Noted to have multiple new vulvar lesions. Biopsy performed on one lesion - pathology with VIN 2/3.   10/23/22: Exam under anesthesia, lysis of vulvar adhesions, multiple vulvar and perianal biopsies, partial simple right vulvectomy x3, vulvar laser ablation  Pathology A. 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 VULVA, RIGHT 11 O'CLOCK, BIOPSY: High-grade squamous intraepithelial lesion, VIN-3 involving the margins of the biopsy C. VULVA, LEFT 2 O'CLOCK, BIOPSY: High-grade squamous intraepithelial lesion, VIN-3 involving the margins of the biopsy D. 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 ANAL, POSTERIOR,  BIOPSY: Hyperkeratosis with chronic inflammation and reactive changes Negative for dysplasia   She underwent a PET scan pre-operatively resulting:  IMPRESSION: 1. No definite evidence of local recurrence or definite metastatic disease. 2. Nonspecific mildly hypermetabolic inguinal lymph nodes bilaterally, probably reactive. 3. Nonspecific midline perineal activity without CT correlate, likely urine contamination. 4. Nonspecific activity in the cecum and transverse colon without corresponding abnormality on the CT images, likely physiologic. Correlate with colon cancer screening history. 5.  Aortic Atherosclerosis (ICD10-I70.0).  On 11/26/2022, she underwent a partial modified radical vulvectomy including distal clitorectomy, bilateral inguinal lymphadenectomy by Dr. Pricilla Holm. Her post-operative course was uneventful and she was discharged home on POD 1.  Interval: Patient reports doing well at home.  Reporting no vulvar discomfort but reports having an odor. She reports having a sticky like drainage around the catheter and on her inner thighs at times. The foley catheter has been draining. No back pain. Her bowels are functioning without difficulty.  No significant vulvar drainage or bleeding reported.  Denies fever or chills.  Tolerating diet without nausea or emesis. No significant lower extremity edema reported.  She has been using ice frequently to the mons and vulva and feels this has helped with the swelling. Her daughter is present today.  See interval. No new symptoms reported on ROS  Exam: Alert, oriented, in no acute distress.  Breathing unlabored.  Abdomen soft. Bilateral groin incisions healing well along with previous JP drain sites.  On assessment of the vulva with chaperone present, improved edema of the mons and bilateral labia.  No significant erythema or exudate around the incision.  Incision is appropriately tender  to palpation.  Inferior aspect of the right vulvar incision has  opened, healing well. There is a mild odor. Foley to straight drain with clear yellow urine in the bag.  The urethra is not obstructed. At 1:20 pm, 170 cc of NS instilled in the bladder. At 1:35 pm, pt able to void freely 150 cc.   Assessment/Plan: 82 year old s/p partial modified radical vulvectomy including distal clitorectomy, bilateral inguinal lymphadenectomy on 11/26/2022 with Dr. Pricilla Holm presenting to the office today for continued post-op follow up, foley removal. Due to urethra being non-obstructed with improvement in vulvar edema, the foley was removed without difficulty. The bladder was backfilled before removal and patient was able to void without difficulty. Vulvar care discussed including use of vaseline and intermittent ice for vulvar/mons edema. The patient and daughter will call the office for any changes. Follow up has been made for two weeks or sooner if needed. Reportable signs and symptoms reviewed.

## 2023-01-15 NOTE — Patient Instructions (Signed)
We will obtain a urine sample today to test and will notify of the results.   Continue use of topical vaseline after toileting. Use the premarin cream on the vulva three times a week at bedtime, pea sized amount.  We will plan on seeing you in the office in two to three months or sooner if symptoms develop.   Please call the office for any new symptoms such itching, pain, burning, bleeding etc.

## 2023-01-15 NOTE — Telephone Encounter (Signed)
-----   Message from Doylene Bode sent at 01/15/2023  2:59 PM EST ----- Please let her know there are some white blood cells seen in her urine and rare bacteria. We will wait to see what the culture shows. ----- Message ----- From: Interface, Lab In [place] Sent: 01/15/2023  12:27 PM EST To: Doylene Bode, NP

## 2023-01-15 NOTE — Progress Notes (Signed)
Urinalysis order changed to complete w microscopic.  The cancer center lab was notified of the order change.

## 2023-01-15 NOTE — Telephone Encounter (Signed)
Spoke with [name] and relayed message from [name], NP that there are some white blood cells seen in her urine and rare bacteria. We will wait to see what the culture shows. Pt verbalized understanding and thanked the office for calling.

## 2023-01-16 LAB — URINE CULTURE: Culture: NO GROWTH

## 2023-01-18 NOTE — Telephone Encounter (Signed)
Spoke with [name] and relayed message from [name], NP that pt's urine culture shows no sign of infection. Pt states, "good news" and thanked the office for calling.

## 2023-01-18 NOTE — Telephone Encounter (Signed)
-----   Message from Doylene Bode sent at 01/18/2023  7:27 AM EST ----- Please let her know no signs of infection on urine culture. ----- Message ----- From: Interface, Lab In [place] Sent: 01/16/2023   3:13 PM EST To: Doylene Bode, NP

## 2023-01-19 DIAGNOSIS — I10 Essential (primary) hypertension: Secondary | ICD-10-CM | POA: Diagnosis not present

## 2023-01-19 DIAGNOSIS — E782 Mixed hyperlipidemia: Secondary | ICD-10-CM | POA: Diagnosis not present

## 2023-01-19 DIAGNOSIS — Z23 Encounter for immunization: Secondary | ICD-10-CM | POA: Diagnosis not present

## 2023-01-19 DIAGNOSIS — I728 Aneurysm of other specified arteries: Secondary | ICD-10-CM | POA: Diagnosis not present

## 2023-01-19 DIAGNOSIS — R7303 Prediabetes: Secondary | ICD-10-CM | POA: Diagnosis not present

## 2023-01-19 DIAGNOSIS — E039 Hypothyroidism, unspecified: Secondary | ICD-10-CM | POA: Diagnosis not present

## 2023-03-05 ENCOUNTER — Ambulatory Visit: Payer: Medicare Other | Admitting: Gynecologic Oncology

## 2023-04-02 ENCOUNTER — Encounter: Payer: Self-pay | Admitting: Gynecologic Oncology

## 2023-04-02 ENCOUNTER — Inpatient Hospital Stay: Payer: Medicare Other | Attending: Gynecologic Oncology | Admitting: Gynecologic Oncology

## 2023-04-02 VITALS — BP 127/65 | HR 73 | Temp 97.9°F | Resp 20 | Wt 171.6 lb

## 2023-04-02 DIAGNOSIS — D071 Carcinoma in situ of vulva: Secondary | ICD-10-CM | POA: Diagnosis present

## 2023-04-02 DIAGNOSIS — Z8541 Personal history of malignant neoplasm of cervix uteri: Secondary | ICD-10-CM

## 2023-04-02 DIAGNOSIS — C519 Malignant neoplasm of vulva, unspecified: Secondary | ICD-10-CM

## 2023-04-02 NOTE — Progress Notes (Signed)
Gynecologic Oncology Return Clinic Visit  04/02/23  Reason for Visit: follow-up   Treatment History: She had a palpable vulvar lump in the winter 2019 and 2020.  It became more noticeable in April 2020 and she attempted to have an appointment with a gynecologist however she could not get an appointment due to the the coronavirus shot downs.  She was eventually able to see a physician on August 25, 2018 at which time evaluation of the vulva confirmed a vulvar mass.  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 De Blanch.    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 09/27/18: Dr Duard Brady performed a radical anterior vulvectomy.  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  The resection margins were negative for carcinoma with the closest being the lateral margin 1:00 at 0.8 cm.  The carcinoma invaded 1.3 cm depth.  There was no lymphovascular or perineural invasion identified.   Postoperatively a PET/CT was obtained on October 06, 2018.  This revealed postoperative changes to the vulva.  There was no hypermetabolic local regional lymphadenopathy.  There is a questionable subtle focus of hypermetabolism in the left lobe of the liver, indeterminate for liver metastases.  MRI abdomen could be performed for further work-up.  No other potential findings of hypermetabolic distant metastatic disease were seen.   On 10/25/2018, the patient underwent bilateral superficial inguinal lymphadenectomy with fibroadipose tissue noted on the left and no carcinoma seen in 2 of 2 lymph nodes on the right.   The patient was seen in November with vulvar irritation that was intermittent in nature.  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  She was started on triamcinolone twice a day until her symptoms improved and then transition to daily.  I saw her at the end of December and she felt some improvement in her vulvar tenderness but still had significant erythema and irritation of the vulva.  I asked her to add Desitin or similar barrier cream.  When I saw her again in February, she had no improvement and had had significant irritation with the Desitin.  We stopped all topical treatment altogether.   ON 3/25, given persistent skin changes and no improvement in symptoms, vulvar biopsied was performed. Biopsy - high grade dysplasia.   06/15/19: partial simple left vulvectomy, partial simple peri-clitoral vulvectomy, vulvar biopsies, extensive CO2 laser ablation of the vulva. VIN3 noted in two vulvectomy specimens, positive margins of left specimen. Biopsies showed VIN1.    Treatment with vaginal/vulvar estrogen since surgery.   05/09/2020: Patient taken to the operating room for vulvar biopsies, lysis of vaginal agglutination, laser ablation of the vulva.  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   We used estrogen intermittently afterwards.   03/11/2021: Given continued vulvar erythema as well as desquamation, patient underwent EUA with vulvar biopsies and CO2 laser of the vulva.  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  Multiple biopsies showed high-grade vulvar dysplasia including vulvar biopsy at 1:00, 11:00, right periurethral, and left periclitoral.   03/25/2021: Given additional biopsies at her first surgery showing high-grade dysplasia that were not treated with laser ablation, additional CO2 laser of the vulva was recommended and performed.  Biopsies taken at the time of surgery showed focal acute inflammation and reactive atypia in  3 of the biopsies.  Only the right vulva at 10:00 showed VIN 2-3.   04/22/22: vulvar biopsies, CO2 laser of the vulva.  Biopsies showed high grade dysplasia, no invasive carcinoma.    10/01/22: Noted to have multiple new vulvar lesions. Biopsy performed on one lesion - pathology with VIN 2/3.   10/23/22: Exam under anesthesia, lysis of vulvar adhesions, multiple vulvar and perianal biopsies, partial simple right vulvectomy x3, vulvar laser ablation.  Multiple biopsy showed VIN 2/3.  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  No lymphovascular involvement was noted of any of these 3 biopsy sites.   11/19/2022: PET scan without evidence of local or metastatic recurrence.  Nonspecific mildly hypermetabolic inguinal nodes bilaterally thought to be likely reactive.  Nonspecific midline perineal activity without CT correlate.   11/26/22: Partial modified radical vulvectomy including distal clitorectomy, bilateral inguinal lymphadenectomy Anterior vulva with VIN 3, no definitive invasion.  Left and right inferior margins positive for VIN 3.  Deep margin negative.   Interval History: Doing very well.  Denies any bleeding or discharge.  Denies any irritation, pruritus, or burning.  Has very mild tenderness occasionally.  Using estrogen 3 times a week.  Has not been using clobetasol.  Sometimes uses Vaseline.  Past Medical/Surgical History: Past Medical History:  Diagnosis Date   Celiac artery aneurysm (HCC) 2012   followed by vascular-- dr Myra Gianotti;  Theron Arista in epic 04-13-2022  stable no sig change since last visit   Complication of anesthesia    slow to wake up /  body temperature goes down   warm blankets with 1st surgery only   History of asthma    04-14-2022 per pt last had a rescue inhaler many yrs ago   History of cervical cancer 2008   s/p  TAH w/ BSO w/ pelvic lymphadenectomy for SCC of cervix   History of vaginal dysplasia    History of vulvar dysplasia 2020   with hx recurrence  s/p  bx's laser ablation and vulvectomy   Hyperlipidemia    Hypertension    Hypothyroidism    followed by pcp   Lichen sclerosus of vulva     Pneumonia    PONV (postoperative nausea and vomiting)    NEEDS NAUSEA MED BEFORE SURGERY   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HCC)    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HCC)    Status post gamma knife treatment 03/2013   RIGHT V2  @ [facility] for trigeminal neuralgia    Past Surgical History:  Procedure Laterality Date   BREAST SURGERY Left    1970s  left breast cyst in areolar area   CATARACT EXTRACTION W/ INTRAOCULAR LENS IMPLANT Bilateral 2017   CO2 LASER APPLICATION N/A 06/15/2019   Procedure: C02 LASER OF VULVAR, VULVAR BIOPSIES,  SIMPLE VULVECTOMY;  Surgeon: Carver Fila, MD;  Location: [facility];  Service: Gynecology;  Laterality: N/A;   CO2 LASER APPLICATION N/A 05/09/2020   Procedure: VULVAR  LASER APPLICATION;  Surgeon: Carver Fila, MD;  Location: [facility];  Service: Gynecology;  Laterality: N/A;   CO2 LASER APPLICATION N/A 03/11/2021   Procedure: CO2 LASER APPLICATION TO VULVA;  Surgeon: Carver Fila, MD;  Location: [facility];  Service: Gynecology;  Laterality: N/A;   CO2 LASER APPLICATION N/A 03/25/2021   Procedure: CO2 LASER APPLICATION TO VULVA;  Surgeon: Carver Fila, MD;  Location: [facility];  Service: Gynecology;  Laterality: N/A;  CO2 LASER APPLICATION N/A 04/22/2022   Procedure: CO2 LASER ABLATION TO VULVA;  Surgeon: Carver Fila, MD;  Location: [facility];  Service: Gynecology;  Laterality: N/A;   CO2 LASER APPLICATION N/A 10/23/2022   Procedure: CO2 LASER OF VULVA;  Surgeon: Carver Fila, MD;  Location: [facility];  Service: Gynecology;  Laterality: N/A;   INGUINAL LYMPHADENECTOMY Bilateral 11/26/2022   Procedure: INGUINAL LYMPHADENECTOMY;  Surgeon: Carver Fila, MD;  Location: [facility];  Service: Gynecology;  Laterality: Bilateral;   LYMPH NODE DISSECTION Bilateral 10/25/2018   Procedure: LYMPH NODE DISSECTION,BILATERAL INGUINOFEMORAL  LYMPHADECTOMY;  Surgeon: Cleda Mccreedy, MD;  Location: [facility];  Service: Gynecology;  Laterality: Bilateral;   RADICAL VULVECTOMY N/A 09/27/2018   Procedure: RADICAL VULVECTOMY;  Surgeon: Cleda Mccreedy, MD;  Location: [facility];  Service: Gynecology;  Laterality: N/A;   RADICAL VULVECTOMY N/A 11/26/2022   Procedure: MODIFIED RADICAL VULVECTOMY;  Surgeon: Carver Fila, MD;  Location: [facility];  Service: Gynecology;  Laterality: N/A;   RETROMASTOID CRANIOTOMY Right 08/08/2020   @ [facility];   MCV DECOMPRESSION TRIGEMINAL NERVE   TOTAL ABDOMINAL HYSTERECTOMY W/ BILATERAL SALPINGOOPHORECTOMY  04/27/2006   @ [facility] by dr Loree Fee;   and Pelvic lymphadenectomy / insertion suprapubic cath   VULVA /PERINEUM BIOPSY N/A 05/09/2020   Procedure: VULVAR BIOPSY;  Surgeon: Carver Fila, MD;  Location: [facility];  Service: Gynecology;  Laterality: N/A;   VULVA Ples Specter BIOPSY N/A 03/11/2021   Procedure: VULVAR BIOPSY;  Surgeon: Carver Fila, MD;  Location: [facility];  Service: Gynecology;  Laterality: N/A;   VULVA Ples Specter BIOPSY N/A 03/25/2021   Procedure: VULVAR BIOPSY;  Surgeon: Carver Fila, MD;  Location: [facility];  Service: Gynecology;  Laterality: N/A;   VULVA Ples Specter BIOPSY N/A 04/22/2022   Procedure: VULVAR BIOPSY;  Surgeon: Carver Fila, MD;  Location: [facility];  Service: Gynecology;  Laterality: N/A;   VULVECTOMY N/A 10/23/2022   Procedure: WIDE EXCISION VULVECTOMY;  Surgeon: Carver Fila, MD;  Location: [facility];  Service: Gynecology;  Laterality: N/A;    Family History  Problem Relation Age of Onset   Skin cancer Father     Social History   Socioeconomic History   Marital status: Widowed    Spouse name: Not on file   Number of children: Not on file   Years of education: Not on file   Highest education level: Not on file  Occupational History   Not on file   Tobacco Use   Smoking status: Former    Current packs/day: 0.00    Types: Cigarettes    Quit date: 12/08/2003    Years since quitting: 19.3    Passive exposure: Current   Smokeless tobacco: Never   Tobacco comments:    social  Vaping Use   Vaping status: Never Used  Substance and Sexual Activity   Alcohol use: No   Drug use: No   Sexual activity: Not Currently  Other Topics Concern   Not on file  Social History Narrative   Not on file   Social Drivers of Health   Financial Resource Strain: Not on file  Food Insecurity: No Food Insecurity (11/26/2022)   Hunger Vital Sign    Worried About Running Out of Food in the Last Year: Never true    Ran Out of Food in the Last Year: Never true  Transportation Needs: No Transportation Needs (11/26/2022)  PRAPARE - Administrator, Civil Service (Medical): No    Lack of Transportation (Non-Medical): No  Physical Activity: Not on file  Stress: Not on file  Social Connections: Not on file    Current Medications:  Current Outpatient Medications:    aspirin 81 MG chewable tablet, Chew 81 mg by mouth at bedtime., Disp: , Rfl:    Carboxymethylcellulose Sod PF 1 % GEL, Place 1 application  into both eyes 6 (six) times daily., Disp: , Rfl:    Cholecalciferol (VITAMIN D3) 50 MCG (2000 UT) TABS, Take 1 tablet by mouth every evening., Disp: , Rfl:    conjugated estrogens (PREMARIN) vaginal cream, Please use pea size amount of cream and place on the vulva three times a week at bedtime., Disp: 42.5 g, Rfl: 6   hydrochlorothiazide (HYDRODIURIL) 25 MG tablet, Take 12.5 mg by mouth daily., Disp: , Rfl:    levothyroxine (SYNTHROID) 75 MCG tablet, Take 75 mcg by mouth daily before breakfast. Alternates 50 mcg and 75 mcg, Disp: , Rfl:    Omega-3 Fatty Acids (FISH OIL) 1200 MG CAPS, Take by mouth., Disp: , Rfl:    rosuvastatin (CRESTOR) 5 MG tablet, Take 5 mg by mouth daily after supper., Disp: , Rfl:   Review of Systems: Denies appetite  changes, fevers, chills, fatigue, unexplained weight changes. Denies hearing loss, neck lumps or masses, mouth sores, ringing in ears or voice changes. Denies cough or wheezing.  Denies shortness of breath. Denies chest pain or palpitations. Denies leg swelling. Denies abdominal distention, pain, blood in stools, constipation, diarrhea, nausea, vomiting, or early satiety. Denies pain with intercourse, dysuria, frequency, hematuria or incontinence. Denies hot flashes, pelvic pain, vaginal bleeding or vaginal discharge.   Denies joint pain, back pain or muscle pain/cramps. Denies itching, rash, or wounds. Denies dizziness, headaches, numbness or seizures. Denies swollen lymph nodes or glands, denies easy bruising or bleeding. Denies anxiety, depression, confusion, or decreased concentration.  Physical Exam: BP 127/65 (BP Location: Left Arm, Patient Position: Sitting)   Pulse 73   Temp 97.9 F (36.6 C) (Oral)   Resp 20   Wt 171 lb 9.6 oz (77.8 kg)   SpO2 97%   BMI 31.39 kg/m  General: Alert, oriented, no acute distress. HEENT: Normocephalic, atraumatic, sclera anicteric. Chest: Unlabored breathing on room air. Abdomen: soft, nontender.  Normoactive bowel sounds.  No masses or hepatosplenomegaly appreciated.  Well-healed groin incisions. No erythema or edema. Extremities: Grossly normal range of motion.  Warm, well perfused.  Trace edema bilaterally lower leg, minimal swelling in upper legs. GU: Improved edema of the mons and bilateral labia.  No significant erythema or exudate around the incision.  Incision has completely healed.  There is significant loss of architecture of the vulva.  There is some leukoplakia around the edge of the vaginal opening, no atypical vascularity or raised lesions.    Laboratory & Radiologic Studies: None new  Assessment & Plan: [name] is a 83 y.o. 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 No residual malignancy noted.  VIN 3 with positive inferior margins from the vulvar specimen bilaterally.  Lymph nodes negative.   Patient has healed completely.  She is basically asymptomatic for the first time today since I have started seeing her.  Exam consistent with findings of lichen sclerosus.  Encouraged her to continue using estrogen, recommend holding off on restarting clobetasol at this time.  Plan for close surveillance.  Given some margins positive for high-grade dysplasia, may require additional treatment with laser versus excision in the near future.  20 minutes of total time was spent for this patient encounter, including preparation, face-to-face counseling with the patient and coordination of care, and documentation of the encounter.  Eugene Garnet, MD  Division of Gynecologic Oncology  Department of Obstetrics and Gynecology  [facility]

## 2023-04-02 NOTE — Patient Instructions (Signed)
It was good to see you today.  I do not see or feel any evidence of cancer recurrence on your exam.  I will see you for follow-up in 3 months.  As always, if you develop any new and concerning symptoms before your next visit, please call to see me sooner.

## 2023-04-06 ENCOUNTER — Other Ambulatory Visit: Payer: Self-pay | Admitting: *Deleted

## 2023-04-06 DIAGNOSIS — I728 Aneurysm of other specified arteries: Secondary | ICD-10-CM

## 2023-04-06 DIAGNOSIS — Z9181 History of falling: Secondary | ICD-10-CM | POA: Diagnosis not present

## 2023-04-06 DIAGNOSIS — Z139 Encounter for screening, unspecified: Secondary | ICD-10-CM | POA: Diagnosis not present

## 2023-04-06 DIAGNOSIS — Z0000 Encounter for general adult medical examination without abnormal findings: Secondary | ICD-10-CM | POA: Diagnosis not present

## 2023-04-19 ENCOUNTER — Ambulatory Visit (HOSPITAL_COMMUNITY)
Admission: RE | Admit: 2023-04-19 | Discharge: 2023-04-19 | Disposition: A | Discharge status: Home / Self Care | Payer: Medicare Other | Source: Ambulatory Visit | Attending: Surgery | Admitting: Surgery

## 2023-04-19 ENCOUNTER — Ambulatory Visit: Payer: Medicare Other | Admitting: Physician Assistant

## 2023-04-19 VITALS — BP 127/79 | HR 57 | Temp 97.3°F | Ht 65.0 in | Wt 173.6 lb

## 2023-04-19 DIAGNOSIS — I728 Aneurysm of other specified arteries: Secondary | ICD-10-CM | POA: Insufficient documentation

## 2023-04-19 NOTE — Progress Notes (Signed)
 MRN : [id number]    History of Present Illness: This is a 83 y.o. 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  In 2014 it measured 1.64mm.  Dr. Charlotte Cookey had discussed with pt that we would not consider elective repair until it has a diameter greater than 2cm.     Pt was last seen 04/13/22 and at that time, she was doing well without any new abdominal or back pain.  She did not have fear of food or weight loss.  Duplex revealed celiac artery aneurysm measured 1.2cm and was unchanged over the past several years.  Celiac artery aneurysm measuring 1.25 x 1.46 cm. No significant change since previous exam on 12/05/20.    She remains asymptomatic for abd/lumbar pain, no weight loss and no post prandial pain.  She denies claudication, rest pain or non healing wounds.   Current Outpatient Medications  Medication Sig Dispense Refill   aspirin  81 MG chewable tablet Chew 81 mg by mouth at bedtime.     Carboxymethylcellulose Sod PF 1 % GEL Place 1 application  into both eyes 6 (six) times daily.     Cholecalciferol  (VITAMIN D3) 50 MCG (2000 UT) TABS Take 1 tablet by mouth every evening.     conjugated estrogens  (PREMARIN ) vaginal cream Please use pea size amount of cream and place on the vulva three times a week at bedtime. 42.5 g 6   hydrochlorothiazide (HYDRODIURIL) 25 MG tablet Take 12.5 mg by mouth daily.     levothyroxine  (SYNTHROID ) 75 MCG tablet Take 75 mcg by mouth daily before breakfast. Alternates 50 mcg and 75 mcg     Omega-3 Fatty Acids (FISH OIL) 1200 MG CAPS Take by mouth.     rosuvastatin  (CRESTOR ) 5 MG tablet Take 5 mg by mouth daily after supper.     No current facility-administered medications for this visit.      Past Medical History:  Diagnosis Date   Celiac artery aneurysm (HCC) 2012   followed by vascular-- dr Charlotte Cookey;  Holli Lunger in epic 04-13-2022  stable no sig change since last visit    Complication of anesthesia    slow to wake up /  body temperature goes down   warm blankets with 1st surgery only   History of asthma    04-14-2022 per pt last had a rescue inhaler many yrs ago   History of cervical cancer 2008   s/p  TAH w/ BSO w/ pelvic lymphadenectomy for SCC of cervix   History of vaginal dysplasia    History of vulvar dysplasia 2020   with hx recurrence  s/p  bx's laser ablation and vulvectomy   Hyperlipidemia    Hypertension    Hypothyroidism    followed by pcp   Lichen sclerosus of vulva    Pneumonia    PONV (postoperative nausea and vomiting)    NEEDS NAUSEA MED BEFORE SURGERY   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HCC)    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HCC)    Status post gamma knife treatment 03/2013   RIGHT V2  @ [facility] for trigeminal neuralgia    Past Surgical History:  Procedure Laterality Date   BREAST SURGERY Left    1970s  left breast cyst in areolar area   CATARACT EXTRACTION W/ INTRAOCULAR LENS IMPLANT Bilateral 2017   CO2 LASER APPLICATION N/A 06/15/2019   Procedure: C02 LASER OF VULVAR, VULVAR BIOPSIES,  SIMPLE VULVECTOMY;  Surgeon: Suzi Essex, MD;  Location: [facility];  Service: Gynecology;  Laterality: N/A;   CO2 LASER APPLICATION N/A 05/09/2020   Procedure: VULVAR  LASER APPLICATION;  Surgeon: Suzi Essex, MD;  Location: [facility];  Service: Gynecology;  Laterality: N/A;   CO2 LASER APPLICATION N/A 03/11/2021   Procedure: CO2 LASER APPLICATION TO VULVA;  Surgeon: Suzi Essex, MD;  Location: [facility];  Service: Gynecology;  Laterality: N/A;   CO2 LASER APPLICATION N/A 03/25/2021   Procedure: CO2 LASER APPLICATION TO VULVA;  Surgeon: Suzi Essex, MD;  Location: [facility];  Service: Gynecology;  Laterality: N/A;   CO2 LASER APPLICATION N/A 04/22/2022   Procedure: CO2 LASER ABLATION TO VULVA;  Surgeon: Suzi Essex, MD;  Location: [facility];  Service: Gynecology;  Laterality: N/A;   CO2 LASER APPLICATION N/A 10/23/2022   Procedure: CO2 LASER OF VULVA;  Surgeon: Suzi Essex, MD;  Location: [facility];  Service: Gynecology;  Laterality: N/A;   INGUINAL LYMPHADENECTOMY Bilateral 11/26/2022   Procedure: INGUINAL LYMPHADENECTOMY;  Surgeon: Suzi Essex, MD;  Location: [facility];  Service: Gynecology;  Laterality: Bilateral;   LYMPH NODE DISSECTION Bilateral 10/25/2018   Procedure: LYMPH NODE DISSECTION,BILATERAL INGUINOFEMORAL LYMPHADECTOMY;  Surgeon: Andra Kava, MD;  Location: [facility];  Service: Gynecology;  Laterality: Bilateral;   RADICAL VULVECTOMY N/A 09/27/2018   Procedure: RADICAL VULVECTOMY;  Surgeon: Andra Kava, MD;  Location: [facility];  Service: Gynecology;  Laterality: N/A;   RADICAL VULVECTOMY N/A 11/26/2022   Procedure: MODIFIED RADICAL VULVECTOMY;  Surgeon: Suzi Essex, MD;  Location: [facility];  Service: Gynecology;  Laterality: N/A;   RETROMASTOID CRANIOTOMY Right 08/08/2020   @ [facility];   MCV DECOMPRESSION TRIGEMINAL NERVE   TOTAL ABDOMINAL HYSTERECTOMY W/ BILATERAL SALPINGOOPHORECTOMY  04/27/2006   @ [facility] by dr Ewell Holes;   and Pelvic lymphadenectomy / insertion suprapubic cath   VULVA /PERINEUM BIOPSY N/A 05/09/2020   Procedure: VULVAR BIOPSY;  Surgeon: Suzi Essex, MD;  Location: [facility];  Service: Gynecology;  Laterality: N/A;   VULVA Asher Blade BIOPSY N/A 03/11/2021   Procedure: VULVAR BIOPSY;  Surgeon: Suzi Essex, MD;  Location: [facility];  Service: Gynecology;  Laterality: N/A;   VULVA Asher Blade BIOPSY N/A 03/25/2021   Procedure: VULVAR BIOPSY;  Surgeon: Suzi Essex, MD;  Location: [facility];  Service: Gynecology;  Laterality: N/A;   VULVA Asher Blade BIOPSY N/A 04/22/2022   Procedure: VULVAR BIOPSY;  Surgeon: Suzi Essex, MD;  Location: [facility];  Service: Gynecology;   Laterality: N/A;   VULVECTOMY N/A 10/23/2022   Procedure: WIDE EXCISION VULVECTOMY;  Surgeon: Suzi Essex, MD;  Location: [facility];  Service: Gynecology;  Laterality: N/A;    Social History Social History   Tobacco Use   Smoking status: Former    Current packs/day: 0.00    Types: Cigarettes    Quit date: 12/08/2003    Years since quitting: 19.3    Passive exposure: Current   Smokeless tobacco: Never   Tobacco comments:    social  Vaping Use   Vaping status: Never Used  Substance Use Topics   Alcohol use: No   Drug use: No    Family History Family History  Problem Relation Age of Onset   Skin cancer Father     Allergies  Allergen Reactions   Prednisone Other (See Comments)  Feels Dizzy   Codeine Anxiety    "Jumpy inside"   Latex Rash     REVIEW OF SYSTEMS  General: [ ]  Weight loss, [ ]  Fever, [ ]  chills Neurologic: [ ]  Dizziness, [ ]  Blackouts, [ ]  Seizure [ ]  Stroke, [ ]  "Mini stroke", [ ]  Slurred speech, [ ]  Temporary blindness; [ ]  weakness in arms or legs, [ ]  Hoarseness [ ]  Dysphagia Cardiac: [ ]  Chest pain/pressure, [ ]  Shortness of breath at rest [ ]  Shortness of breath with exertion, [ ]  Atrial fibrillation or irregular heartbeat  Vascular: [ ]  Pain in legs with walking, [ ]  Pain in legs at rest, [ ]  Pain in legs at night,  [ ]  Non-healing ulcer, [ ]  Blood clot in vein/DVT,   Pulmonary: [ ]  Home oxygen, [ ]  Productive cough, [ ]  Coughing up blood, [ ]  Asthma,  [ ]  Wheezing [ ]  COPD Musculoskeletal:  [ ]  Arthritis, [ ]  Low back pain, [ ]  Joint pain Hematologic: [ ]  Easy Bruising, [ ]  Anemia; [ ]  Hepatitis Gastrointestinal: [ ]  Blood in stool, [ ]  Gastroesophageal Reflux/heartburn, Urinary: [ ]  chronic Kidney disease, [ ]  on HD - [ ]  MWF or [ ]  TTHS, [ ]  Burning with urination, [ ]  Difficulty urinating Skin: [ ]  Rashes, [ ]  Wounds Psychological: [ ]  Anxiety, [ ]  Depression  Physical Examination Vitals:   04/19/23 1018  BP:  127/79  Pulse: (!) 57  Temp: (!) 97.3 F (36.3 C)  SpO2: 93%  Weight: 173 lb 9.6 oz (78.7 kg)  Height: 5\' 5"  (1.651 m)   Body mass index is 28.89 kg/m.  General:  WDWN in NAD Gait: Normal HENT: WNL Eyes: Pupils equal Pulmonary: normal non-labored breathing , without Rales, rhonchi,  wheezing Cardiac: RRR, without  Murmurs, rubs or gallops; No carotid bruits Abdomen: soft, NT, no masses Skin: no rashes, ulcers noted;  no Gangrene , no cellulitis; no open wounds;   Vascular Exam/Pulses:no ischemic changes, radial and DP pulses palpable B.    Musculoskeletal: no muscle wasting or atrophy; no edema  Neurologic: A&O X 3; Appropriate Affect ;  SENSATION: normal; MOTOR FUNCTION: 5/5 Symmetric Speech is fluent/normal   Significant Diagnostic Studies: CBC Lab Results  Component Value Date   WBC 7.0 11/27/2022   HGB 11.2 (L) 11/27/2022   HCT 35.9 (L) 11/27/2022   MCV 94.5 11/27/2022   PLT 135 (L) 11/27/2022    BMET    Component Value Date/Time   NA 135 11/27/2022 0456   K 3.6 11/27/2022 0456   CL 104 11/27/2022 0456   CO2 21 (L) 11/27/2022 0456   GLUCOSE 110 (H) 11/27/2022 0456   BUN 9 11/27/2022 0456   CREATININE 0.67 11/27/2022 0456   CALCIUM  9.0 11/27/2022 0456   GFRNONAA >60 11/27/2022 0456   GFRAA >60 06/12/2019 1307   CrCl cannot be calculated (Patient's most recent lab result is older than the maximum 21 days allowed.).  COAG No results found for: "INR", "PROTIME"   Non-Invasive Vascular Imaging:  Duplex Findings:  +----------------------+--------+--------+------+--------------+  Mesenteric           PSV cm/sEDV cm/sPlaque   Comments     +----------------------+--------+--------+------+--------------+  Celiac Artery Proximal   97      25         1.36 x 1.46 cm  +----------------------+--------+--------+------+--------------+  SMA Proximal            195      32                          +----------------------+--------+--------+------+--------------+  SMA Mid                 152      30                         +----------------------+--------+--------+------+--------------+  SMA Distal              180      35                         +----------------------+--------+--------+------+--------------+  CHA                    159      22                         +----------------------+--------+--------+------+--------------+  Splenic                176      56                         +----------------------+--------+--------+------+--------------+        Summary:  Mesenteric:  Normal Superior Mesenteric artery, Splenic artery and Hepatic artery  findings.  Celiac artery aneurysm measuring 1.36 x 1.46 cm. No significant change  since previous exam on 04/13/22.    ASSESSMENT/PLAN:   Celiac measuring 1.36 x 1.46 cm. No significant change  since previous exam on 04/13/22.  She remains asymptomatic for post prandial pain, abdominal/lumbar pain out of the ordinary.  She will start a walking program to stay active.  If she develops ischemic abdominal symptoms she will call 911.  Her daughter is with her today.     We would not consider elective repair until it has a diameter greater than 2cm.  Continue medical management with ASA and Statin daily.    Rocky Cipro 04/19/2023 10:43 AM

## 2023-04-23 ENCOUNTER — Other Ambulatory Visit: Payer: Self-pay

## 2023-04-23 DIAGNOSIS — I728 Aneurysm of other specified arteries: Secondary | ICD-10-CM

## 2023-05-20 ENCOUNTER — Encounter: Payer: Self-pay | Admitting: Obstetrics and Gynecology

## 2023-07-06 ENCOUNTER — Inpatient Hospital Stay: Payer: Medicare Other | Attending: Gynecologic Oncology | Admitting: Gynecologic Oncology

## 2023-07-06 VITALS — BP 124/76 | HR 64 | Resp 16 | Wt 170.0 lb

## 2023-07-06 DIAGNOSIS — Z79890 Hormone replacement therapy (postmenopausal): Secondary | ICD-10-CM | POA: Diagnosis not present

## 2023-07-06 DIAGNOSIS — C519 Malignant neoplasm of vulva, unspecified: Secondary | ICD-10-CM

## 2023-07-06 DIAGNOSIS — Z90710 Acquired absence of both cervix and uterus: Secondary | ICD-10-CM | POA: Insufficient documentation

## 2023-07-06 DIAGNOSIS — Z8541 Personal history of malignant neoplasm of cervix uteri: Secondary | ICD-10-CM | POA: Insufficient documentation

## 2023-07-06 DIAGNOSIS — Z86002 Personal history of in-situ neoplasm of other and unspecified genital organs: Secondary | ICD-10-CM | POA: Diagnosis present

## 2023-07-06 DIAGNOSIS — Z90722 Acquired absence of ovaries, bilateral: Secondary | ICD-10-CM | POA: Diagnosis not present

## 2023-07-06 DIAGNOSIS — N904 Leukoplakia of vulva: Secondary | ICD-10-CM | POA: Insufficient documentation

## 2023-07-06 NOTE — Progress Notes (Signed)
 Gynecologic Oncology Return Clinic Visit  07/06/23  Reason for Visit: follow-up   Treatment History: She had a palpable vulvar lump in the winter of 2019 and 2020.  It became more noticeable in April 2020 and she attempted to have an appointment with a gynecologist however she could not get an appointment due to the the coronavirus shot downs.  She was eventually able to see a physician on August 25, 2018 at which time evaluation of the vulva confirmed a vulvar mass.  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 Valeen Gartner.    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 09/27/18: Dr Clerance Dais performed a radical anterior vulvectomy.  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  The resection margins were negative for carcinoma with the closest being the lateral margin 1:00 at 0.8 cm.  The carcinoma invaded 1.3 cm depth.  There was no lymphovascular or perineural invasion identified.   Postoperatively a PET/CT was obtained on October 06, 2018.  This revealed postoperative changes to the vulva.  There was no hypermetabolic local regional lymphadenopathy.  There is a questionable subtle focus of hypermetabolism in the left lobe of the liver, indeterminate for liver metastases.  MRI abdomen could be performed for further work-up.  No other potential findings of hypermetabolic distant metastatic disease were seen.   On 10/25/2018, the patient underwent bilateral superficial inguinal lymphadenectomy with fibroadipose tissue noted on the left and no carcinoma seen in 2 of 2 lymph nodes on the right.   The patient was seen in November with vulvar irritation that was intermittent in nature.  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  She was started on triamcinolone  twice a day until her symptoms improved and then transition to daily.  I saw her at the end of December and she felt some improvement in her vulvar tenderness but still had significant erythema and irritation of the vulva.  I asked her to add Desitin or similar barrier cream.  When I saw her again in February, she had no improvement and had had significant irritation with the Desitin.  We stopped all topical treatment altogether.   ON 3/25, given persistent skin changes and no improvement in symptoms, vulvar biopsied was performed. Biopsy - high grade dysplasia.   06/15/19: partial simple left vulvectomy, partial simple peri-clitoral vulvectomy, vulvar biopsies, extensive CO2 laser ablation of the vulva. VIN3 noted in two vulvectomy specimens, positive margins of left specimen. Biopsies showed VIN1.    Treatment with vaginal/vulvar estrogen since surgery.   05/09/2020: Patient taken to the operating room for vulvar biopsies, lysis of vaginal agglutination, laser ablation of the vulva.  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   We used estrogen intermittently afterwards.   03/11/2021: Given continued vulvar erythema as well as desquamation, patient underwent EUA with vulvar biopsies and CO2 laser of the vulva.  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  Multiple biopsies showed high-grade vulvar dysplasia including vulvar biopsy at 1:00, 11:00, right periurethral, and left periclitoral.   03/25/2021: Given additional biopsies at her first surgery showing high-grade dysplasia that were not treated with laser ablation, additional CO2 laser of the vulva was recommended and performed.  Biopsies taken at the time of surgery showed focal acute inflammation and reactive atypia  in 3 of the biopsies.  Only the right vulva at 10:00 showed VIN 2-3.   04/22/22: vulvar biopsies, CO2 laser of the vulva.  Biopsies showed high grade dysplasia, no invasive carcinoma.    10/01/22: Noted to have multiple new vulvar lesions. Biopsy performed on one lesion - pathology with VIN 2/3.   10/23/22: Exam under anesthesia, lysis of vulvar adhesions, multiple vulvar and perianal biopsies, partial simple right vulvectomy x3, vulvar laser ablation.  Multiple biopsy showed VIN 2/3.  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  No lymphovascular involvement was noted of any of these 3 biopsy sites.   11/19/2022: PET scan without evidence of local or metastatic recurrence.  Nonspecific mildly hypermetabolic inguinal nodes bilaterally thought to be likely reactive.  Nonspecific midline perineal activity without CT correlate.   11/26/22: Partial modified radical vulvectomy including distal clitorectomy, bilateral inguinal lymphadenectomy Anterior vulva with VIN 3, no definitive invasion.  Left and right inferior margins positive for VIN 3.  Deep margin negative.   Interval History: Doing very well.  Denies any bleeding or discharge.  Denies any irritation, pruritus, or burning.  No pain reported.  Using estrogen 3 times a week.  Has not been using clobetasol .  Sometimes uses Vaseline as barrier method. Voiding without difficulty. Bowels functioning and using prune juice as needed. Appetite good. Denies chest pain, dyspnea. No concerning symptoms voiced. Daughter present at visit.  Past Medical/Surgical History: Past Medical History:  Diagnosis Date   Celiac artery aneurysm (HCC) 2012   followed by vascular-- dr Charlotte Cookey;  Holli Lunger in epic 04-13-2022  stable no sig change since last visit   Complication of anesthesia    slow to wake up /  body temperature goes down   warm blankets with 1st surgery only   History of asthma    04-14-2022 per pt last had a rescue inhaler many yrs ago   History of cervical cancer 2008   s/p  TAH w/ BSO w/ pelvic lymphadenectomy for SCC of cervix   History of vaginal dysplasia    History of vulvar  dysplasia 2020   with hx recurrence  s/p  bx's laser ablation and vulvectomy   Hyperlipidemia    Hypertension    Hypothyroidism    followed by pcp   Lichen sclerosus of vulva    Pneumonia    PONV (postoperative nausea and vomiting)    NEEDS NAUSEA MED BEFORE SURGERY   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HCC)    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HCC)    Status post gamma knife treatment 03/2013   RIGHT V2  @ [facility] for trigeminal neuralgia    Past Surgical History:  Procedure Laterality Date   BREAST SURGERY Left    1970s  left breast cyst in areolar area   CATARACT EXTRACTION W/ INTRAOCULAR LENS IMPLANT Bilateral 2017   CO2 LASER APPLICATION N/A 06/15/2019   Procedure: C02 LASER OF VULVAR, VULVAR BIOPSIES,  SIMPLE VULVECTOMY;  Surgeon: Suzi Essex, MD;  Location: [facility];  Service: Gynecology;  Laterality: N/A;   CO2 LASER APPLICATION N/A 05/09/2020   Procedure: VULVAR  LASER APPLICATION;  Surgeon: Suzi Essex, MD;  Location: [facility];  Service: Gynecology;  Laterality: N/A;   CO2 LASER APPLICATION N/A 03/11/2021   Procedure: CO2 LASER APPLICATION TO VULVA;  Surgeon: Suzi Essex, MD;  Location: [facility];  Service: Gynecology;  Laterality: N/A;   CO2 LASER APPLICATION N/A  03/25/2021   Procedure: CO2 LASER APPLICATION TO VULVA;  Surgeon: Suzi Essex, MD;  Location: [facility];  Service: Gynecology;  Laterality: N/A;   CO2 LASER APPLICATION N/A 04/22/2022   Procedure: CO2 LASER ABLATION TO VULVA;  Surgeon: Suzi Essex, MD;  Location: [facility];  Service: Gynecology;  Laterality: N/A;   CO2 LASER APPLICATION N/A 10/23/2022   Procedure: CO2 LASER OF VULVA;  Surgeon: Suzi Essex, MD;  Location: [facility];  Service: Gynecology;  Laterality: N/A;   INGUINAL LYMPHADENECTOMY Bilateral 11/26/2022   Procedure: INGUINAL LYMPHADENECTOMY;  Surgeon: Suzi Essex, MD;  Location: [facility];  Service: Gynecology;  Laterality: Bilateral;   LYMPH NODE DISSECTION Bilateral 10/25/2018   Procedure: LYMPH NODE DISSECTION,BILATERAL INGUINOFEMORAL LYMPHADECTOMY;  Surgeon: Andra Kava, MD;  Location: [facility];  Service: Gynecology;  Laterality: Bilateral;   RADICAL VULVECTOMY N/A 09/27/2018   Procedure: RADICAL VULVECTOMY;  Surgeon: Andra Kava, MD;  Location: [facility];  Service: Gynecology;  Laterality: N/A;   RADICAL VULVECTOMY N/A 11/26/2022   Procedure: MODIFIED RADICAL VULVECTOMY;  Surgeon: Suzi Essex, MD;  Location: [facility];  Service: Gynecology;  Laterality: N/A;   RETROMASTOID CRANIOTOMY Right 08/08/2020   @ [facility];   MCV DECOMPRESSION TRIGEMINAL NERVE   TOTAL ABDOMINAL HYSTERECTOMY W/ BILATERAL SALPINGOOPHORECTOMY  04/27/2006   @ [facility] by dr Ewell Holes;   and Pelvic lymphadenectomy / insertion suprapubic cath   VULVA /PERINEUM BIOPSY N/A 05/09/2020   Procedure: VULVAR BIOPSY;  Surgeon: Suzi Essex, MD;  Location: [facility];  Service: Gynecology;  Laterality: N/A;   VULVA Asher Blade BIOPSY N/A 03/11/2021   Procedure: VULVAR BIOPSY;  Surgeon: Suzi Essex, MD;  Location: [facility];  Service: Gynecology;  Laterality: N/A;   VULVA Asher Blade BIOPSY N/A 03/25/2021   Procedure: VULVAR BIOPSY;  Surgeon: Suzi Essex, MD;  Location: [facility];  Service: Gynecology;  Laterality: N/A;   VULVA Asher Blade BIOPSY N/A 04/22/2022   Procedure: VULVAR BIOPSY;  Surgeon: Suzi Essex, MD;  Location: [facility];  Service: Gynecology;  Laterality: N/A;   VULVECTOMY N/A 10/23/2022   Procedure: WIDE EXCISION VULVECTOMY;  Surgeon: Suzi Essex, MD;  Location: [facility];  Service: Gynecology;  Laterality: N/A;    Family History  Problem Relation Age of Onset   Skin cancer Father     Social History   Socioeconomic History   Marital status: Widowed     Spouse name: Not on file   Number of children: Not on file   Years of education: Not on file   Highest education level: Not on file  Occupational History   Not on file  Tobacco Use   Smoking status: Former    Current packs/day: 0.00    Types: Cigarettes    Quit date: 12/08/2003    Years since quitting: 19.5    Passive exposure: Current   Smokeless tobacco: Never   Tobacco comments:    social  Vaping Use   Vaping status: Never Used  Substance and Sexual Activity   Alcohol use: No   Drug use: No   Sexual activity: Not Currently  Other Topics Concern   Not on file  Social History Narrative   Not on file   Social Drivers of Health   Financial Resource Strain: Not on file  Food Insecurity: No Food Insecurity (11/26/2022)   Hunger Vital Sign    Worried About Running Out of  Food in the Last Year: Never true    Ran Out of Food in the Last Year: Never true  Transportation Needs: No Transportation Needs (11/26/2022)   PRAPARE - Administrator, Civil Service (Medical): No    Lack of Transportation (Non-Medical): No  Physical Activity: Not on file  Stress: Not on file  Social Connections: Not on file    Current Medications:  Current Outpatient Medications:    aspirin  81 MG chewable tablet, Chew 81 mg by mouth at bedtime., Disp: , Rfl:    Carboxymethylcellulose Sod PF 1 % GEL, Place 1 application  into both eyes 6 (six) times daily., Disp: , Rfl:    Cholecalciferol  (VITAMIN D3) 50 MCG (2000 UT) TABS, Take 1 tablet by mouth every evening., Disp: , Rfl:    conjugated estrogens  (PREMARIN ) vaginal cream, Please use pea size amount of cream and place on the vulva three times a week at bedtime., Disp: 42.5 g, Rfl: 6   hydrochlorothiazide (HYDRODIURIL) 25 MG tablet, Take 12.5 mg by mouth daily., Disp: , Rfl:    levothyroxine  (SYNTHROID ) 50 MCG tablet, Take by mouth., Disp: , Rfl:    levothyroxine  (SYNTHROID ) 75 MCG tablet, Take 75 mcg by mouth daily before breakfast.  Alternates 50 mcg and 75 mcg, Disp: , Rfl:    Omega-3 Fatty Acids (FISH OIL) 1200 MG CAPS, Take by mouth., Disp: , Rfl:    rosuvastatin  (CRESTOR ) 5 MG tablet, Take 5 mg by mouth daily after supper., Disp: , Rfl:   Review of Systems: See interval. Additional review negative.  Physical Exam: BP 124/76 (BP Location: Left Arm, Patient Position: Sitting)   Pulse 64   Resp 16   Wt 170 lb (77.1 kg)   SpO2 98%   BMI 28.29 kg/m  General: Alert, oriented, no acute distress. HEENT: Normocephalic, atraumatic, sclera anicteric. Chest: Unlabored breathing on room air. Lungs clear. Heart regular in rate and rhythm. Abdomen: soft, nontender.  Normoactive bowel sounds.  No masses or hepatosplenomegaly appreciated.  Well-healed groin incisions. No erythema or edema. Extremities: Grossly normal range of motion.  Warm, well perfused.  Trace edema bilaterally lower leg, minimal swelling in upper legs. GU: There is significant loss of architecture of the vulva.  There is mild leukoplakia around the edge of the vaginal opening, no atypical vascularity or raised lesions. Stable exam. No concerning lesions.    Laboratory & Radiologic Studies: None new  Assessment & Plan: [name] is a 83 y.o. 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 No residual malignancy noted.  VIN 3 with positive inferior margins from the vulvar specimen bilaterally.  Lymph nodes negative.   Exam continues to be consistent with findings of lichen sclerosus.  Encouraged her to continue using estrogen, recommend holding off on restarting clobetasol  at this time.  Continue plan for close surveillance with follow up in three months or sooner if needed.  Given some margins positive for high-grade dysplasia, may require additional treatment with laser versus excision in the future. Reportable signs and symptoms  reviewed.  20 minutes of total time was spent for this patient encounter, including preparation, face-to-face counseling with the patient and coordination of care, and documentation of the encounter.  Vira Grieves NP [facility] GYN Oncology

## 2023-07-06 NOTE — Patient Instructions (Signed)
  It was great to see you today.  I do not see or feel any evidence of cancer recurrence on your exam.  Continue with the application of the estrogen cream three times a week. Please call for the development of any new symptoms such as itching, pain, bleeding, soreness.   We will see you for follow-up in 3 months.   As always, if you develop any new and concerning symptoms before your next visit, please call to see me sooner.

## 2023-07-20 DIAGNOSIS — E039 Hypothyroidism, unspecified: Secondary | ICD-10-CM | POA: Diagnosis not present

## 2023-07-20 DIAGNOSIS — E782 Mixed hyperlipidemia: Secondary | ICD-10-CM | POA: Diagnosis not present

## 2023-07-20 DIAGNOSIS — E559 Vitamin D deficiency, unspecified: Secondary | ICD-10-CM | POA: Diagnosis not present

## 2023-07-20 DIAGNOSIS — I10 Essential (primary) hypertension: Secondary | ICD-10-CM | POA: Diagnosis not present

## 2023-07-20 DIAGNOSIS — R7303 Prediabetes: Secondary | ICD-10-CM | POA: Diagnosis not present

## 2023-07-22 DIAGNOSIS — R7303 Prediabetes: Secondary | ICD-10-CM | POA: Diagnosis not present

## 2023-07-22 DIAGNOSIS — E039 Hypothyroidism, unspecified: Secondary | ICD-10-CM | POA: Diagnosis not present

## 2023-07-22 DIAGNOSIS — Z23 Encounter for immunization: Secondary | ICD-10-CM | POA: Diagnosis not present

## 2023-07-22 DIAGNOSIS — E782 Mixed hyperlipidemia: Secondary | ICD-10-CM | POA: Diagnosis not present

## 2023-07-22 DIAGNOSIS — I728 Aneurysm of other specified arteries: Secondary | ICD-10-CM | POA: Diagnosis not present

## 2023-07-22 DIAGNOSIS — I10 Essential (primary) hypertension: Secondary | ICD-10-CM | POA: Diagnosis not present

## 2023-08-24 DIAGNOSIS — E039 Hypothyroidism, unspecified: Secondary | ICD-10-CM | POA: Diagnosis not present

## 2023-10-22 ENCOUNTER — Inpatient Hospital Stay: Attending: Gynecologic Oncology | Admitting: Gynecologic Oncology

## 2023-10-22 ENCOUNTER — Encounter: Payer: Self-pay | Admitting: Gynecologic Oncology

## 2023-10-22 VITALS — BP 124/64 | HR 66 | Temp 97.9°F | Resp 19 | Wt 170.2 lb

## 2023-10-22 DIAGNOSIS — Z90710 Acquired absence of both cervix and uterus: Secondary | ICD-10-CM | POA: Diagnosis not present

## 2023-10-22 DIAGNOSIS — N904 Leukoplakia of vulva: Secondary | ICD-10-CM | POA: Diagnosis not present

## 2023-10-22 DIAGNOSIS — Z86002 Personal history of in-situ neoplasm of other and unspecified genital organs: Secondary | ICD-10-CM | POA: Diagnosis not present

## 2023-10-22 DIAGNOSIS — Z8541 Personal history of malignant neoplasm of cervix uteri: Secondary | ICD-10-CM | POA: Insufficient documentation

## 2023-10-22 DIAGNOSIS — Z90722 Acquired absence of ovaries, bilateral: Secondary | ICD-10-CM | POA: Insufficient documentation

## 2023-10-22 DIAGNOSIS — Z8544 Personal history of malignant neoplasm of other female genital organs: Secondary | ICD-10-CM | POA: Diagnosis not present

## 2023-10-22 DIAGNOSIS — C519 Malignant neoplasm of vulva, unspecified: Secondary | ICD-10-CM

## 2023-10-22 DIAGNOSIS — Z9079 Acquired absence of other genital organ(s): Secondary | ICD-10-CM | POA: Insufficient documentation

## 2023-10-22 DIAGNOSIS — D071 Carcinoma in situ of vulva: Secondary | ICD-10-CM

## 2023-10-22 DIAGNOSIS — Z86003 Personal history of in-situ neoplasm of oral cavity, esophagus and stomach: Secondary | ICD-10-CM

## 2023-10-22 NOTE — Progress Notes (Signed)
 Gynecologic Oncology Return Clinic Visit  10/22/23  Reason for Visit: follow-up   Treatment History: She had a palpable vulvar lump in the winter 2019 and 2020.  It became more noticeable in April 2020 and she attempted to have an appointment with a gynecologist however she could not get an appointment due to the the coronavirus shot downs.  She was eventually able to see a physician on August 25, 2018 at which time evaluation of the vulva confirmed a vulvar mass.  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 Toribio Percy.    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 09/27/18: Dr Dodie performed a radical anterior vulvectomy.  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  The resection margins were negative for carcinoma with the closest being the lateral margin 1:00 at 0.8 cm.  The carcinoma invaded 1.3 cm depth.  There was no lymphovascular or perineural invasion identified.   Postoperatively a PET/CT was obtained on October 06, 2018.  This revealed postoperative changes to the vulva.  There was no hypermetabolic local regional lymphadenopathy.  There is a questionable subtle focus of hypermetabolism in the left lobe of the liver, indeterminate for liver metastases.  MRI abdomen could be performed for further work-up.  No other potential findings of hypermetabolic distant metastatic disease were seen.   On 10/25/2018, the patient underwent bilateral superficial inguinal lymphadenectomy with fibroadipose tissue noted on the left and no carcinoma seen in 2 of 2 lymph nodes on the right.   The patient was seen in November with vulvar irritation that was intermittent in nature.  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  She was started on triamcinolone twice a day until her symptoms improved and then transition to daily.  I saw her at the end of December and she felt some improvement in her vulvar tenderness but still had significant erythema and irritation of the vulva.  I asked her to add Desitin or similar barrier cream.  When I saw her again in February, she had no improvement and had had significant irritation with the Desitin.  We stopped all topical treatment altogether.   ON 3/25, given persistent skin changes and no improvement in symptoms, vulvar biopsied was performed. Biopsy - high grade dysplasia.   06/15/19: partial simple left vulvectomy, partial simple peri-clitoral vulvectomy, vulvar biopsies, extensive CO2 laser ablation of the vulva. VIN3 noted in two vulvectomy specimens, positive margins of left specimen. Biopsies showed VIN1.    Treatment with vaginal/vulvar estrogen since surgery.   05/09/2020: Patient taken to the operating room for vulvar biopsies, lysis of vaginal agglutination, laser ablation of the vulva.  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   We used estrogen intermittently afterwards.   03/11/2021: Given continued vulvar erythema as well as desquamation, patient underwent EUA with vulvar biopsies and CO2 laser of the vulva.  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  Multiple biopsies showed high-grade vulvar dysplasia including vulvar biopsy at 1:00, 11:00, right periurethral, and left periclitoral.   03/25/2021: Given additional biopsies at her first surgery showing high-grade dysplasia that were not treated with laser ablation, additional CO2 laser of the vulva was recommended and performed.  Biopsies taken at the time of surgery showed focal acute inflammation and reactive atypia in  3 of the biopsies.  Only the right vulva at 10:00 showed VIN 2-3.   04/22/22: vulvar biopsies, CO2 laser of the vulva.  Biopsies showed high grade dysplasia, no invasive carcinoma.    10/01/22: Noted to have multiple new vulvar lesions. Biopsy performed on one lesion - pathology with VIN 2/3.   10/23/22: Exam under anesthesia, lysis of vulvar adhesions, multiple vulvar and perianal biopsies, partial simple right vulvectomy x3, vulvar laser ablation.  Multiple biopsy showed VIN 2/3.  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  No lymphovascular involvement was noted of any of these 3 biopsy sites.   11/19/2022: PET scan without evidence of local or metastatic recurrence.  Nonspecific mildly hypermetabolic inguinal nodes bilaterally thought to be likely reactive.  Nonspecific midline perineal activity without CT correlate.   11/26/22: Partial modified radical vulvectomy including distal clitorectomy, bilateral inguinal lymphadenectomy Anterior vulva with VIN 3, no definitive invasion.  Left and right inferior margins positive for VIN 3.  Deep margin negative.   Interval History: Doing well.  Notes some tenderness intermittently of her vulva, denies any irritation or itching since I saw her last.  Denies any vaginal bleeding or discharge.  Has been using estrogen 3 times a week.  Has not used any clobetasol .  Past Medical/Surgical History: Past Medical History:  Diagnosis Date   Celiac artery aneurysm (HCC) 2012   followed by vascular-- dr serene;  arnetta in epic 04-13-2022  stable no sig change since last visit   Complication of anesthesia    slow to wake up /  body temperature goes down   warm blankets with 1st surgery only   History of asthma    04-14-2022 per pt last had a rescue inhaler many yrs ago   History of cervical cancer 2008   s/p  TAH w/ BSO w/ pelvic lymphadenectomy for SCC of cervix   History of vaginal dysplasia    History of vulvar dysplasia 2020   with hx recurrence  s/p  bx's laser ablation and vulvectomy   Hyperlipidemia    Hypertension    Hypothyroidism    followed by pcp   Lichen sclerosus of  vulva    Pneumonia    PONV (postoperative nausea and vomiting)    NEEDS NAUSEA MED BEFORE SURGERY   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HCC)    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HCC)    Status post gamma knife treatment 03/2013   RIGHT V2  @ [facility] for trigeminal neuralgia    Past Surgical History:  Procedure Laterality Date   BREAST SURGERY Left    1970s  left breast cyst in areolar area   CATARACT EXTRACTION W/ INTRAOCULAR LENS IMPLANT Bilateral 2017   CO2 LASER APPLICATION N/A 06/15/2019   Procedure: C02 LASER OF VULVAR, VULVAR BIOPSIES,  SIMPLE VULVECTOMY;  Surgeon: Viktoria Comer SAUNDERS, MD;  Location: [facility] [place];  Service: Gynecology;  Laterality: N/A;   CO2 LASER APPLICATION N/A 05/09/2020   Procedure: VULVAR  LASER APPLICATION;  Surgeon: Viktoria Comer SAUNDERS, MD;  Location: [facility];  Service: Gynecology;  Laterality: N/A;   CO2 LASER APPLICATION N/A 03/11/2021   Procedure: CO2 LASER APPLICATION TO VULVA;  Surgeon: Viktoria Comer SAUNDERS, MD;  Location: [facility];  Service: Gynecology;  Laterality: N/A;   CO2 LASER APPLICATION N/A 03/25/2021   Procedure: CO2 LASER APPLICATION TO VULVA;  Surgeon: Viktoria Comer SAUNDERS, MD;  Location: [facility];  Service: Gynecology;  Laterality: N/A;   CO2 LASER APPLICATION N/A 04/22/2022   Procedure: CO2 LASER ABLATION TO VULVA;  Surgeon: Viktoria Comer SAUNDERS, MD;  Location: [facility];  Service: Gynecology;  Laterality: N/A;   CO2 LASER APPLICATION N/A 10/23/2022   Procedure: CO2 LASER OF VULVA;  Surgeon: Viktoria Comer SAUNDERS, MD;  Location: [facility];  Service: Gynecology;  Laterality: N/A;   INGUINAL LYMPHADENECTOMY Bilateral 11/26/2022   Procedure: INGUINAL LYMPHADENECTOMY;  Surgeon: Viktoria Comer SAUNDERS, MD;  Location: [facility];  Service: Gynecology;  Laterality: Bilateral;   LYMPH NODE DISSECTION Bilateral 10/25/2018   Procedure: LYMPH NODE DISSECTION,BILATERAL  INGUINOFEMORAL LYMPHADECTOMY;  Surgeon: Dodie Shadow, MD;  Location: [facility];  Service: Gynecology;  Laterality: Bilateral;   RADICAL VULVECTOMY N/A 09/27/2018   Procedure: RADICAL VULVECTOMY;  Surgeon: Dodie Shadow, MD;  Location: [facility];  Service: Gynecology;  Laterality: N/A;   RADICAL VULVECTOMY N/A 11/26/2022   Procedure: MODIFIED RADICAL VULVECTOMY;  Surgeon: Viktoria Comer SAUNDERS, MD;  Location: [facility];  Service: Gynecology;  Laterality: N/A;   RETROMASTOID CRANIOTOMY Right 08/08/2020   @ [facility];   MCV DECOMPRESSION TRIGEMINAL NERVE   TOTAL ABDOMINAL HYSTERECTOMY W/ BILATERAL SALPINGOOPHORECTOMY  04/27/2006   @ [facility] by dr olean;   and Pelvic lymphadenectomy / insertion suprapubic cath   VULVA /PERINEUM BIOPSY N/A 05/09/2020   Procedure: VULVAR BIOPSY;  Surgeon: Viktoria Comer SAUNDERS, MD;  Location: [facility];  Service: Gynecology;  Laterality: N/A;   VULVA MARYBETH BIOPSY N/A 03/11/2021   Procedure: VULVAR BIOPSY;  Surgeon: Viktoria Comer SAUNDERS, MD;  Location: [facility];  Service: Gynecology;  Laterality: N/A;   VULVA MARYBETH BIOPSY N/A 03/25/2021   Procedure: VULVAR BIOPSY;  Surgeon: Viktoria Comer SAUNDERS, MD;  Location: [facility];  Service: Gynecology;  Laterality: N/A;   VULVA MARYBETH BIOPSY N/A 04/22/2022   Procedure: VULVAR BIOPSY;  Surgeon: Viktoria Comer SAUNDERS, MD;  Location: [facility];  Service: Gynecology;  Laterality: N/A;   VULVECTOMY N/A 10/23/2022   Procedure: WIDE EXCISION VULVECTOMY;  Surgeon: Viktoria Comer SAUNDERS, MD;  Location: [facility];  Service: Gynecology;  Laterality: N/A;    Family History  Problem Relation Age of Onset   Skin cancer Father     Social History   Socioeconomic History   Marital status: Widowed    Spouse name: Not on file   Number of children: Not on file   Years of education: Not on file   Highest education level: Not on file  Occupational History   Not  on file  Tobacco Use   Smoking status: Former    Current packs/day: 0.00    Types: Cigarettes    Quit date: 12/08/2003    Years since quitting: 19.8    Passive exposure: Current   Smokeless tobacco: Never   Tobacco comments:    social  Vaping Use   Vaping status: Never Used  Substance and Sexual Activity   Alcohol use: No   Drug use: No   Sexual activity: Not Currently  Other Topics Concern   Not on file  Social History Narrative   Not on file   Social Drivers of Health   Financial Resource Strain: Not on file  Food Insecurity: No Food Insecurity (10/19/2023)   Hunger Vital Sign    Worried About Running Out of Food in the Last Year: Never true    Ran Out of Food in the Last Year: Never true  Transportation Needs: No Transportation  Needs (10/19/2023)   PRAPARE - Administrator, Civil Service (Medical): No    Lack of Transportation (Non-Medical): No  Physical Activity: Not on file  Stress: Not on file  Social Connections: Not on file    Current Medications:  Current Outpatient Medications:    aspirin  81 MG chewable tablet, Chew 81 mg by mouth at bedtime., Disp: , Rfl:    Carboxymethylcellulose Sod PF 1 % GEL, Place 1 application  into both eyes 6 (six) times daily., Disp: , Rfl:    Cholecalciferol  (VITAMIN D3) 50 MCG (2000 UT) TABS, Take 1 tablet by mouth every evening., Disp: , Rfl:    conjugated estrogens  (PREMARIN ) vaginal cream, Please use pea size amount of cream and place on the vulva three times a week at bedtime., Disp: 42.5 g, Rfl: 6   hydrochlorothiazide (HYDRODIURIL) 12.5 MG tablet, Take 12.5 mg by mouth daily., Disp: , Rfl:    levothyroxine  (SYNTHROID ) 75 MCG tablet, Take 75 mcg by mouth daily before breakfast. Alternates 50 mcg and 75 mcg, Disp: , Rfl:    Omega-3 Fatty Acids (FISH OIL) 1200 MG CAPS, Take by mouth., Disp: , Rfl:    rosuvastatin  (CRESTOR ) 5 MG tablet, Take 5 mg by mouth daily after supper., Disp: , Rfl:   Review of Systems: Denies  appetite changes, fevers, chills, fatigue, unexplained weight changes. Denies hearing loss, neck lumps or masses, mouth sores, ringing in ears or voice changes. Denies cough or wheezing.  Denies shortness of breath. Denies chest pain or palpitations. Denies leg swelling. Denies abdominal distention, pain, blood in stools, constipation, diarrhea, nausea, vomiting, or early satiety. Denies pain with intercourse, dysuria, frequency, hematuria or incontinence. Denies hot flashes, pelvic pain, vaginal bleeding or vaginal discharge.   Denies joint pain, back pain or muscle pain/cramps. Denies itching, rash, or wounds. Denies dizziness, headaches, numbness or seizures. Denies swollen lymph nodes or glands, denies easy bruising or bleeding. Denies anxiety, depression, confusion, or decreased concentration.  Physical Exam: BP 124/64 (BP Location: Left Arm, Patient Position: Sitting)   Pulse 66   Temp 97.9 F (36.6 C) (Oral)   Resp 19   Wt 170 lb 3.2 oz (77.2 kg)   SpO2 96%   BMI 28.32 kg/m  General: Alert, oriented, no acute distress. HEENT: Normocephalic, atraumatic, sclera anicteric. Chest: Unlabored breathing on room air.  Lungs clear to auscultation bilaterally, no wheezes or rhonchi. Cardiovascular: Regular rate and rhythm, no murmurs or rubs appreciated. Abdomen: soft, nontender.  Normoactive bowel sounds.  No masses or hepatosplenomegaly appreciated.  Well-healed groin incisions. No erythema or edema. Extremities: Grossly normal range of motion.  Warm, well perfused.  Trace edema bilaterally lower leg, minimal swelling in upper legs. GU: Minimal edema of the mons and bilateral labia.  There is significant loss of architecture of the vulva.  There is some leukoplakia around the edge of the vaginal opening, no atypical vascularity or raised lesions.  No discrete lesions noted.  On gentle bimanual exam with 1 digit, no lesions appreciated in the vagina.  Picture taken below of external  exam.    Laboratory & Radiologic Studies: None new  Assessment & Plan: [name] is a 83 y.o. 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 No residual malignancy noted.  VIN 3 with positive inferior margins from the vulvar specimen bilaterally.  Lymph nodes negative. Remote history of cervical cancer treated in 2008. History of lichen  sclerosus.   No new lesions noted on exam today.  Discussed continued estrogen use.  Would use clobetasol  only if she develops symptoms such as pruritus.    Plan for close surveillance.  Given some margins positive for high-grade dysplasia, may require additional treatment with laser versus excision in the near future.  22 minutes of total time was spent for this patient encounter, including preparation, face-to-face counseling with the patient and coordination of care, and documentation of the encounter.  Comer Dollar, MD  Division of Gynecologic Oncology  Department of Obstetrics and Gynecology  [facility]

## 2023-10-22 NOTE — Patient Instructions (Signed)
 It was good to see you today.  I will see you in 3 months.  Please look when you get home and see if you still have a tube of clobetasol .  This is the cream that we use to treat symptoms related to the skin condition called lichen sclerosus.  You would use these if you are having itching or a lot of irritation on your vulva.  Otherwise, please continue using the estrogen cream.

## 2024-01-21 ENCOUNTER — Inpatient Hospital Stay: Attending: Gynecologic Oncology | Admitting: Gynecologic Oncology

## 2024-01-21 ENCOUNTER — Encounter: Payer: Self-pay | Admitting: Gynecologic Oncology

## 2024-01-21 VITALS — BP 131/56 | HR 69 | Temp 98.0°F | Resp 19 | Wt 170.8 lb

## 2024-01-21 DIAGNOSIS — Z8544 Personal history of malignant neoplasm of other female genital organs: Secondary | ICD-10-CM | POA: Insufficient documentation

## 2024-01-21 DIAGNOSIS — C519 Malignant neoplasm of vulva, unspecified: Secondary | ICD-10-CM

## 2024-01-21 DIAGNOSIS — Z90722 Acquired absence of ovaries, bilateral: Secondary | ICD-10-CM | POA: Diagnosis not present

## 2024-01-21 DIAGNOSIS — Z8541 Personal history of malignant neoplasm of cervix uteri: Secondary | ICD-10-CM | POA: Diagnosis not present

## 2024-01-21 DIAGNOSIS — Q525 Fusion of labia: Secondary | ICD-10-CM | POA: Diagnosis not present

## 2024-01-21 DIAGNOSIS — N9089 Other specified noninflammatory disorders of vulva and perineum: Secondary | ICD-10-CM

## 2024-01-21 DIAGNOSIS — Z90710 Acquired absence of both cervix and uterus: Secondary | ICD-10-CM | POA: Diagnosis not present

## 2024-01-21 DIAGNOSIS — Z9079 Acquired absence of other genital organ(s): Secondary | ICD-10-CM | POA: Diagnosis not present

## 2024-01-21 DIAGNOSIS — D071 Carcinoma in situ of vulva: Secondary | ICD-10-CM

## 2024-01-21 DIAGNOSIS — Z79890 Hormone replacement therapy (postmenopausal): Secondary | ICD-10-CM | POA: Diagnosis not present

## 2024-01-21 MED ORDER — ESTROGENS CONJUGATED 0.625 MG/GM VA CREA: TOPICAL_CREAM | VAGINAL | 6 refills | Status: AC

## 2024-01-21 NOTE — Progress Notes (Signed)
 Gynecologic Oncology Return Clinic Visit  01/21/24  Reason for Visit: follow-up   Treatment History: She had a palpable vulvar lump in the winter 2019 and 2020.  It became more noticeable in April 2020 and she attempted to have an appointment with a gynecologist however she could not get an appointment due to the the coronavirus shot downs.  She was eventually able to see a physician on August 25, 2018 at which time evaluation of the vulva confirmed a vulvar mass.  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 Toribio Percy.    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 09/27/18: Dr Dodie performed a radical anterior vulvectomy.  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  The resection margins were negative for carcinoma with the closest being the lateral margin 1:00 at 0.8 cm.  The carcinoma invaded 1.3 cm depth.  There was no lymphovascular or perineural invasion identified.   Postoperatively a PET/CT was obtained on October 06, 2018.  This revealed postoperative changes to the vulva.  There was no hypermetabolic local regional lymphadenopathy.  There is a questionable subtle focus of hypermetabolism in the left lobe of the liver, indeterminate for liver metastases.  MRI abdomen could be performed for further work-up.  No other potential findings of hypermetabolic distant metastatic disease were seen.   On 10/25/2018, the patient underwent bilateral superficial inguinal lymphadenectomy with fibroadipose tissue noted on the left and no carcinoma seen in 2 of 2 lymph nodes on the right.   The patient was seen in November with vulvar irritation that was intermittent in nature.  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  She was started on triamcinolone  twice a day until her symptoms improved and then transition to daily.  I saw her at the end of December and she felt some improvement in her vulvar tenderness but still had significant erythema and irritation of the vulva.  I asked her to add Desitin or similar barrier cream.  When I saw her again in February, she had no improvement and had had significant irritation with the Desitin.  We stopped all topical treatment altogether.   ON 3/25, given persistent skin changes and no improvement in symptoms, vulvar biopsied was performed. Biopsy - high grade dysplasia.   06/15/19: partial simple left vulvectomy, partial simple peri-clitoral vulvectomy, vulvar biopsies, extensive CO2 laser ablation of the vulva. VIN3 noted in two vulvectomy specimens, positive margins of left specimen. Biopsies showed VIN1.    Treatment with vaginal/vulvar estrogen since surgery.   05/09/2020: Patient taken to the operating room for vulvar biopsies, lysis of vaginal agglutination, laser ablation of the vulva.  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   We used estrogen intermittently afterwards.   03/11/2021: Given continued vulvar erythema as well as desquamation, patient underwent EUA with vulvar biopsies and CO2 laser of the vulva.  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  Multiple biopsies showed high-grade vulvar dysplasia including vulvar biopsy at 1:00, 11:00, right periurethral, and left periclitoral.   03/25/2021: Given additional biopsies at her first surgery showing high-grade dysplasia that were not treated with laser ablation, additional CO2 laser of the vulva was recommended and performed.  Biopsies taken at the time of surgery showed focal acute inflammation and reactive atypia in  3 of the biopsies.  Only the right vulva at 10:00 showed VIN 2-3.   04/22/22: vulvar biopsies, CO2 laser of the vulva.  Biopsies showed high grade dysplasia, no invasive carcinoma.    10/01/22: Noted to have multiple new vulvar lesions. Biopsy performed on one lesion - pathology with VIN 2/3.   10/23/22: Exam under anesthesia, lysis of vulvar adhesions, multiple vulvar and perianal biopsies, partial simple right vulvectomy x3, vulvar laser ablation.  Multiple biopsy showed VIN 2/3.  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  No lymphovascular involvement was noted of any of these 3 biopsy sites.   11/19/2022: PET scan without evidence of local or metastatic recurrence.  Nonspecific mildly hypermetabolic inguinal nodes bilaterally thought to be likely reactive.  Nonspecific midline perineal activity without CT correlate.   11/26/22: Partial modified radical vulvectomy including distal clitorectomy, bilateral inguinal lymphadenectomy Anterior vulva with VIN 3, no definitive invasion.  Left and right inferior margins positive for VIN 3.  Deep margin negative. Lymph nodes negative.  Interval History: Doing well.  Has been using vaginal estrogen at night 3 times a week.  Has not use clobetasol  since she saw me.  Notes overall symptoms are stable.  Denies any bleeding or discharge.  Has some irritation on average once a week, other days does not have any pain or discomfort.  Denies any pruritus.  Past Medical/Surgical History: Past Medical History:  Diagnosis Date   Celiac artery aneurysm 2012   followed by vascular-- dr serene;  arnetta in epic 04-13-2022  stable no sig change since last visit   Complication of anesthesia    slow to wake up /  body temperature goes down   warm blankets with 1st surgery only   History of asthma    04-14-2022 per pt last had a rescue inhaler many yrs ago   History of cervical cancer 2008   s/p  TAH w/ BSO w/ pelvic lymphadenectomy for SCC of cervix   History of vaginal dysplasia    History of vulvar dysplasia 2020   with hx recurrence  s/p  bx's laser ablation and vulvectomy   Hyperlipidemia     Hypertension    Hypothyroidism    followed by pcp   Lichen sclerosus of vulva    Pneumonia    PONV (postoperative nausea and vomiting)    NEEDS NAUSEA MED BEFORE SURGERY   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HCC)    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HCC)    Status post gamma knife treatment 03/2013   RIGHT V2  @ [facility] for trigeminal neuralgia    Past Surgical History:  Procedure Laterality Date   BREAST SURGERY Left    1970s  left breast cyst in areolar area   CATARACT EXTRACTION W/ INTRAOCULAR LENS IMPLANT Bilateral 2017   CO2 LASER APPLICATION N/A 06/15/2019   Procedure: C02 LASER OF VULVAR, VULVAR BIOPSIES,  SIMPLE VULVECTOMY;  Surgeon: Viktoria Comer SAUNDERS, MD;  Location: [facility];  Service: Gynecology;  Laterality: N/A;   CO2 LASER APPLICATION N/A 05/09/2020   Procedure: VULVAR  LASER APPLICATION;  Surgeon: Viktoria Comer SAUNDERS, MD;  [facility]  Service: Gynecology;  Laterality: N/A;   CO2 LASER APPLICATION N/A 03/11/2021   Procedure: CO2 LASER APPLICATION TO VULVA;  Surgeon: Viktoria Comer SAUNDERS, MD;  Location: [facility];  Service: Gynecology;  Laterality: N/A;   CO2 LASER APPLICATION N/A 03/25/2021   Procedure: CO2 LASER APPLICATION TO  VULVA;  Surgeon: Viktoria Comer SAUNDERS, MD;  Location: [facility];  Service: Gynecology;  Laterality: N/A;   CO2 LASER APPLICATION N/A 04/22/2022   Procedure: CO2 LASER ABLATION TO VULVA;  Surgeon: Viktoria Comer SAUNDERS, MD;  Location: [facility];  Service: Gynecology;  Laterality: N/A;   CO2 LASER APPLICATION N/A 10/23/2022   Procedure: CO2 LASER OF VULVA;  Surgeon: Viktoria Comer SAUNDERS, MD;  Location: [facility];  Service: Gynecology;  Laterality: N/A;   INGUINAL LYMPHADENECTOMY Bilateral 11/26/2022   Procedure: INGUINAL LYMPHADENECTOMY;  Surgeon: Viktoria Comer SAUNDERS, MD;  Location: [facility];  Service: Gynecology;  Laterality: Bilateral;   LYMPH NODE DISSECTION  Bilateral 10/25/2018   Procedure: LYMPH NODE DISSECTION,BILATERAL INGUINOFEMORAL LYMPHADECTOMY;  Surgeon: Dodie Shadow, MD;  Location: [facility];  Service: Gynecology;  Laterality: Bilateral;   RADICAL VULVECTOMY N/A 09/27/2018   Procedure: RADICAL VULVECTOMY;  Surgeon: Dodie Shadow, MD;  Location: [facility];  Service: Gynecology;  Laterality: N/A;   RADICAL VULVECTOMY N/A 11/26/2022   Procedure: MODIFIED RADICAL VULVECTOMY;  Surgeon: Viktoria Comer SAUNDERS, MD;  Location: [facility];  Service: Gynecology;  Laterality: N/A;   RETROMASTOID CRANIOTOMY Right 08/08/2020   @ [facility];   MCV DECOMPRESSION TRIGEMINAL NERVE   TOTAL ABDOMINAL HYSTERECTOMY W/ BILATERAL SALPINGOOPHORECTOMY  04/27/2006   @ [facility] by dr olean;   and Pelvic lymphadenectomy / insertion suprapubic cath   VULVA /PERINEUM BIOPSY N/A 05/09/2020   Procedure: VULVAR BIOPSY;  Surgeon: Viktoria Comer SAUNDERS, MD;  Location: [facility];  Service: Gynecology;  Laterality: N/A;   VULVA MARYBETH BIOPSY N/A 03/11/2021   Procedure: VULVAR BIOPSY;  Surgeon: Viktoria Comer SAUNDERS, MD;  Location[facility];  Service: Gynecology;  Laterality: N/A;   VULVA MARYBETH BIOPSY N/A 03/25/2021   Procedure: VULVAR BIOPSY;  Surgeon: Viktoria Comer SAUNDERS, MD;  Location: [facility];  Service: Gynecology;  Laterality: N/A;   VULVA MARYBETH BIOPSY N/A 04/22/2022   Procedure: VULVAR BIOPSY;  Surgeon: Viktoria Comer SAUNDERS, MD;  Location: [facility];  Service: Gynecology;  Laterality: N/A;   VULVECTOMY N/A 10/23/2022   Procedure: WIDE EXCISION VULVECTOMY;  Surgeon: Viktoria Comer SAUNDERS, MD;  Location: [facility];  Service: Gynecology;  Laterality: N/A;    Family History  Problem Relation Age of Onset   Skin cancer Father     Social History   Socioeconomic History   Marital status: Widowed    Spouse name: Not on file   Number of children: Not on file   Years of education: Not on file    Highest education level: Not on file  Occupational History   Not on file  Tobacco Use   Smoking status: Former    Current packs/day: 0.00    Types: Cigarettes    Quit date: 12/08/2003    Years since quitting: 20.1    Passive exposure: Current   Smokeless tobacco: Never   Tobacco comments:    social  Vaping Use   Vaping status: Never Used  Substance and Sexual Activity   Alcohol use: No   Drug use: No   Sexual activity: Not Currently  Other Topics Concern   Not on file  Social History Narrative   Not on file   Social Drivers of Health   Financial Resource Strain: Not on file  Food Insecurity: No Food Insecurity (10/19/2023)   Hunger Vital Sign    Worried About Running Out of Food in the Last Year: Never true  Ran Out of Food in the Last Year: Never true  Transportation Needs: No Transportation Needs (10/19/2023)   PRAPARE - Administrator, Civil Service (Medical): No    Lack of Transportation (Non-Medical): No  Physical Activity: Not on file  Stress: Not on file  Social Connections: Not on file    Current Medications:  Current Outpatient Medications:    aspirin  81 MG chewable tablet, Chew 81 mg by mouth at bedtime., Disp: , Rfl:    Carboxymethylcellulose Sod PF 1 % GEL, Place 1 application  into both eyes 6 (six) times daily., Disp: , Rfl:    Cholecalciferol  (VITAMIN D3) 50 MCG (2000 UT) TABS, Take 1 tablet by mouth every evening., Disp: , Rfl:    conjugated estrogens  (PREMARIN ) vaginal cream, Please use pea size amount of cream and place on the vulva three times a week at bedtime., Disp: 42.5 g, Rfl: 6   hydrochlorothiazide (HYDRODIURIL) 12.5 MG tablet, Take 12.5 mg by mouth daily., Disp: , Rfl:    levothyroxine  (SYNTHROID ) 75 MCG tablet, Take 75 mcg by mouth daily before breakfast. Alternates 50 mcg and 75 mcg, Disp: , Rfl:    Omega-3 Fatty Acids (FISH OIL) 1200 MG CAPS, Take by mouth., Disp: , Rfl:    rosuvastatin  (CRESTOR ) 5 MG tablet, Take 5 mg by mouth  daily after supper., Disp: , Rfl:   Review of Systems: Denies appetite changes, fevers, chills, fatigue, unexplained weight changes. Denies hearing loss, neck lumps or masses, mouth sores, ringing in ears or voice changes. Denies cough or wheezing.  Denies shortness of breath. Denies chest pain or palpitations. Denies leg swelling. Denies abdominal distention, pain, blood in stools, constipation, diarrhea, nausea, vomiting, or early satiety. Denies pain with intercourse, dysuria, frequency, hematuria or incontinence. Denies hot flashes, pelvic pain, vaginal bleeding or vaginal discharge.   Denies joint pain, back pain or muscle pain/cramps. Denies itching, rash, or wounds. Denies dizziness, headaches, numbness or seizures. Denies swollen lymph nodes or glands, denies easy bruising or bleeding. Denies anxiety, depression, confusion, or decreased concentration.  Physical Exam: BP (!) 131/56 (BP Location: Right Arm, Patient Position: Sitting)   Pulse 69   Temp 98 F (36.7 C) (Oral)   Resp 19   Wt 170 lb 12.8 oz (77.5 kg)   SpO2 95%   BMI 28.42 kg/m  General: Alert, oriented, no acute distress. HEENT: Normocephalic, atraumatic, sclera anicteric. Chest: Unlabored breathing on room air. Abdomen: soft, nontender.  Normoactive bowel sounds.  No masses or hepatosplenomegaly appreciated.  Well-healed groin incisions. No erythema or edema. Extremities: Grossly normal range of motion.  Warm, well perfused.  Trace edema bilaterally lower leg, minimal swelling in upper legs. GU: Stable to improved edema of the mons.  Slightly more beefy red tissue of vaginal mucosa with some leukoplakia along the border.  There is a new mildly ulcerative lesion along the right lateral vulva.    Vulvar biopsy procedure Preoperative diagnosis: history of VIN3, vulvar cancer, new vulvar lesion Postoperative diagnosis: Same as above Physician: Viktoria MD Estimated blood loss: Minimal Specimens: vulvar  biopsy Procedure: After the procedure was discussed with the patient including risks and benefits, she gave verbal consent.  The area of the vulva to be biopsied was cleansed with Betadine x3.  1 cc of 2% lidocaine  was then injected for local anesthesia.  A millimeter punch biopsy was taken and placed in formalin.  Over nitrate was used to achieve hemostasis.  Overall the patient tolerated the procedure well.  All  instruments were removed from the vagina.   Laboratory & Radiologic Studies: None new  Assessment & Plan: [name] is a 83 y.o. 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 No residual malignancy noted.  VIN 3 with positive inferior margins from the vulvar specimen bilaterally.  Lymph nodes negative. Remote history of cervical cancer treated in 2008. History of lichen sclerosus.   New mildly ulcerative lesion seen on exam today and biopsy performed.  I will call the patient and her daughter with biopsy results next week.    Discussed continued estrogen use.  Would use clobetasol  only if she develops symptoms such as pruritus.    Plan for close surveillance.    Given some margins positive for high-grade dysplasia, may require additional treatment with laser versus excision in the near future.  If biopsy shows dysplasia, will likely plan for exam under anesthesia, additional biopsies, and possible laser ablation.  22 minutes of total time was spent for this patient encounter, including preparation, face-to-face counseling with the patient and coordination of care, and documentation of the encounter.  Comer Dollar, MD  Division of Gynecologic Oncology  Department of Obstetrics and Gynecology  [facility]

## 2024-01-21 NOTE — Patient Instructions (Signed)
 It was good to see you today.  We will tentatively plan on a follow-up visit in 3 months.  I will call next week to let you know biopsy results.  New prescription for vaginal estrogen was sent to your pharmacy.

## 2024-01-25 LAB — SURGICAL PATHOLOGY

## 2024-01-26 ENCOUNTER — Ambulatory Visit: Payer: Self-pay | Admitting: Gynecologic Oncology

## 2024-01-26 NOTE — Telephone Encounter (Signed)
-----   Message from Comer JONELLE Dollar sent at 01/26/2024 12:10 PM EST ----- Could you please let patient/daughter know that biopsy did not show any precancer or cancer? Thank you! ----- Message ----- From: Interface, Lab In Three Zero One Sent: 01/25/2024  10:34 AM EST To: Comer JONELLE Dollar, MD

## 2024-01-26 NOTE — Telephone Encounter (Signed)
 Spoke with [name] and relayed message from Dr. Viktoria that patient's biopsy did not show any precancer or cancer. Pt was pleased with the results and thanked the office for calling.

## 2024-05-02 ENCOUNTER — Inpatient Hospital Stay: Attending: Gynecologic Oncology | Admitting: Gynecologic Oncology

## 2024-07-28 ENCOUNTER — Inpatient Hospital Stay: Attending: Gynecologic Oncology | Admitting: Gynecologic Oncology
